# Patient Record
Sex: Female | Born: 1985 | Race: Black or African American | Hispanic: No | Marital: Single | Smoking: Never smoker
Health system: Southern US, Community
[De-identification: ages and names within clinical notes are randomized; demographics above are authoritative.]

## PROBLEM LIST (undated history)

## (undated) DIAGNOSIS — R112 Nausea with vomiting, unspecified: Secondary | ICD-10-CM

## (undated) DIAGNOSIS — Z973 Presence of spectacles and contact lenses: Secondary | ICD-10-CM

## (undated) DIAGNOSIS — N200 Calculus of kidney: Secondary | ICD-10-CM

## (undated) DIAGNOSIS — N201 Calculus of ureter: Secondary | ICD-10-CM

## (undated) DIAGNOSIS — E039 Hypothyroidism, unspecified: Secondary | ICD-10-CM

## (undated) DIAGNOSIS — Z87442 Personal history of urinary calculi: Secondary | ICD-10-CM

## (undated) DIAGNOSIS — R399 Unspecified symptoms and signs involving the genitourinary system: Secondary | ICD-10-CM

## (undated) DIAGNOSIS — Z8489 Family history of other specified conditions: Secondary | ICD-10-CM

## (undated) DIAGNOSIS — F32A Depression, unspecified: Secondary | ICD-10-CM

## (undated) DIAGNOSIS — E0590 Thyrotoxicosis, unspecified without thyrotoxic crisis or storm: Secondary | ICD-10-CM

## (undated) DIAGNOSIS — K219 Gastro-esophageal reflux disease without esophagitis: Secondary | ICD-10-CM

## (undated) DIAGNOSIS — F419 Anxiety disorder, unspecified: Secondary | ICD-10-CM

## (undated) HISTORY — DX: Calculus of kidney: N20.0

## (undated) HISTORY — DX: Thyrotoxicosis, unspecified without thyrotoxic crisis or storm: E05.90

## (undated) HISTORY — DX: Anxiety disorder, unspecified: F41.9

## (undated) HISTORY — DX: Depression, unspecified: F32.A

## (undated) HISTORY — PX: KNEE ARTHROSCOPY: SUR90

---

## 2010-10-15 DIAGNOSIS — R61 Generalized hyperhidrosis: Secondary | ICD-10-CM | POA: Insufficient documentation

## 2013-04-03 DIAGNOSIS — Z524 Kidney donor: Secondary | ICD-10-CM | POA: Insufficient documentation

## 2014-01-05 ENCOUNTER — Ambulatory Visit: Payer: Self-pay

## 2014-01-05 LAB — CBC WITH DIFFERENTIAL/PLATELET
BASOS PCT: 0.4 %
Basophil #: 0 10*3/uL (ref 0.0–0.1)
EOS ABS: 0.1 10*3/uL (ref 0.0–0.7)
Eosinophil %: 1.1 %
HCT: 38 % (ref 35.0–47.0)
HGB: 12.7 g/dL (ref 12.0–16.0)
Lymphocyte #: 2.6 10*3/uL (ref 1.0–3.6)
Lymphocyte %: 29.8 %
MCH: 32.4 pg (ref 26.0–34.0)
MCHC: 33.3 g/dL (ref 32.0–36.0)
MCV: 97 fL (ref 80–100)
MONOS PCT: 9.3 %
Monocyte #: 0.8 x10 3/mm (ref 0.2–0.9)
NEUTROS ABS: 5.2 10*3/uL (ref 1.4–6.5)
NEUTROS PCT: 59.4 %
PLATELETS: 262 10*3/uL (ref 150–440)
RBC: 3.91 10*6/uL (ref 3.80–5.20)
RDW: 13 % (ref 11.5–14.5)
WBC: 8.7 10*3/uL (ref 3.6–11.0)

## 2014-01-05 LAB — COMPREHENSIVE METABOLIC PANEL
ALK PHOS: 48 U/L
ANION GAP: 7 (ref 7–16)
AST: 16 U/L (ref 15–37)
Albumin: 3.3 g/dL — ABNORMAL LOW (ref 3.4–5.0)
BUN: 17 mg/dL (ref 7–18)
Bilirubin,Total: 0.2 mg/dL (ref 0.2–1.0)
CHLORIDE: 107 mmol/L (ref 98–107)
Calcium, Total: 8.9 mg/dL (ref 8.5–10.1)
Co2: 31 mmol/L (ref 21–32)
Creatinine: 0.75 mg/dL (ref 0.60–1.30)
EGFR (Non-African Amer.): >60
GLUCOSE: 101 mg/dL — AB (ref 65–99)
OSMOLALITY: 290 (ref 275–301)
Potassium: 5 mmol/L (ref 3.5–5.1)
SGPT (ALT): 37 U/L (ref 12–78)
SODIUM: 145 mmol/L (ref 136–145)
Total Protein: 6.8 g/dL (ref 6.4–8.2)

## 2014-01-05 LAB — URINALYSIS, COMPLETE
Bilirubin,UR: NEGATIVE
GLUCOSE, UR: NEGATIVE mg/dL (ref 0–75)
Ketone: NEGATIVE
Nitrite: NEGATIVE
PROTEIN: NEGATIVE
Ph: 7 (ref 4.5–8.0)
SPECIFIC GRAVITY: 1.01 (ref 1.003–1.030)

## 2014-01-05 LAB — PREGNANCY, URINE: Pregnancy Test, Urine: NEGATIVE m[IU]/mL

## 2014-01-05 LAB — LIPASE, BLOOD: LIPASE: 191 U/L (ref 73–393)

## 2014-01-05 LAB — AMYLASE: Amylase: 98 U/L (ref 25–115)

## 2014-01-08 LAB — URINE CULTURE

## 2015-09-06 ENCOUNTER — Other Ambulatory Visit: Payer: Self-pay | Admitting: Nurse Practitioner

## 2015-09-06 ENCOUNTER — Ambulatory Visit
Admission: RE | Admit: 2015-09-06 | Discharge: 2015-09-06 | Disposition: A | Discharge status: Home / Self Care | Payer: BLUE CROSS/BLUE SHIELD | Source: Ambulatory Visit | Attending: Nurse Practitioner | Admitting: Nurse Practitioner

## 2015-09-06 DIAGNOSIS — R062 Wheezing: Secondary | ICD-10-CM

## 2015-09-06 DIAGNOSIS — R059 Cough, unspecified: Secondary | ICD-10-CM

## 2015-09-06 DIAGNOSIS — R05 Cough: Secondary | ICD-10-CM | POA: Diagnosis not present

## 2016-03-23 ENCOUNTER — Ambulatory Visit (INDEPENDENT_AMBULATORY_CARE_PROVIDER_SITE_OTHER): Payer: BLUE CROSS/BLUE SHIELD | Admitting: Urology

## 2016-03-23 ENCOUNTER — Encounter: Payer: Self-pay | Admitting: Urology

## 2016-03-23 VITALS — BP 118/79 | HR 74 | Ht 61.0 in | Wt 101.0 lb

## 2016-03-23 DIAGNOSIS — N200 Calculus of kidney: Secondary | ICD-10-CM

## 2016-03-23 DIAGNOSIS — R310 Gross hematuria: Secondary | ICD-10-CM

## 2016-03-23 DIAGNOSIS — R109 Unspecified abdominal pain: Secondary | ICD-10-CM

## 2016-03-23 DIAGNOSIS — R1012 Left upper quadrant pain: Secondary | ICD-10-CM | POA: Diagnosis not present

## 2016-03-23 LAB — URINALYSIS, COMPLETE
Bilirubin, UA: NEGATIVE
Glucose, UA: NEGATIVE
Nitrite, UA: NEGATIVE
SPEC GRAV UA: 1.025 (ref 1.005–1.030)
Urobilinogen, Ur: 1 mg/dL (ref 0.2–1.0)
pH, UA: 6.5 (ref 5.0–7.5)

## 2016-03-23 LAB — MICROSCOPIC EXAMINATION: WBC, UA: >30 /hpf — ABNORMAL HIGH (ref 0–?)

## 2016-03-23 MED ORDER — OXYCODONE-ACETAMINOPHEN 10-325 MG PO TABS: 1.0000 | ORAL_TABLET | ORAL | 0 refills | Status: DC | PRN

## 2016-03-23 NOTE — Progress Notes (Signed)
03/23/2016 4:11 PM   Mariah Bird 1985-08-19 [id number]  Referring provider: Ronnell Freshwater, NP [place], [place] [address]  Chief Complaint  Patient presents with  . Nephrolithiasis    HPI: Patient is 30 year old [place] American female who presents today after passing several stones.    She brings a fragment in today.    She states that about three weeks ago she started experiencing renal colic and passing fragments.  Her pain is located in the left flank and radiates into the left waist.  The pain lasts for several minutes to a few hours. She is having some nausea and vomiting.  She stated that nothing helped the pain or made it worse.  It reached levels of 10/10 at times.    She is having nocturia, intermittency, hesitancy and gross hematuria.  Her UA today demonstrates 3-10 RBC's/hpf.  She also has > 30 WBC's/hpf and many bacteria.  She has not had fevers or chills.    CT scan in 2015 noted subtle bilateral nonobstructive nephrolithiasis. Marland Kitchen      PMH: Past Medical History:  Diagnosis Date  . Nephrolithiasis     Surgical History: Past Surgical History:  Procedure Laterality Date  . KNEE ARTHROSCOPY      Home Medications:    Medication List       Accurate as of 03/23/16  4:11 PM. Always use your most recent med list.          LOW-OGESTREL 0.3-30 MG-MCG tablet Generic drug:  norgestrel-ethinyl estradiol TK UTD   oxyCODONE-acetaminophen 10-325 MG tablet Commonly known as:  PERCOCET Take 1 tablet by mouth every 4 (four) hours as needed for pain.   Vitamin D (Ergocalciferol) 50000 units Caps capsule Commonly known as:  DRISDOL TK ONE C PO ONCE WEEKLY FOR VITAMIN-D DEFICIENCY       Allergies: No Known Allergies  Family History: Family History  Problem Relation Age of Onset  . Kidney Stones Father   . Kidney Stones Paternal Grandmother   . Bladder Cancer Neg Hx   . Kidney cancer Neg Hx   . Prostate cancer Neg Hx     Social  History:  reports that she has never smoked. She has never used smokeless tobacco. She reports that she drinks alcohol. She reports that she does not use drugs.  ROS: UROLOGY Frequent Urination?: No Hard to postpone urination?: No Burning/pain with urination?: No Get up at night to urinate?: Yes Leakage of urine?: No Urine stream starts and stops?: No Trouble starting stream?: No Do you have to strain to urinate?: No Blood in urine?: Yes Urinary tract infection?: No Sexually transmitted disease?: No Injury to kidneys or bladder?: No Painful intercourse?: Yes Weak stream?: No Currently pregnant?: No Vaginal bleeding?: Yes  Gastrointestinal Nausea?: Yes Vomiting?: Yes Indigestion/heartburn?: No Diarrhea?: No Constipation?: No  Constitutional Fever: No Night sweats?: No Weight loss?: No Fatigue?: No  Skin Skin rash/lesions?: No Itching?: No  Eyes Blurred vision?: No Double vision?: No  Ears/Nose/Throat Sore throat?: No Sinus problems?: Yes  Hematologic/Lymphatic Swollen glands?: No Easy bruising?: No  Cardiovascular Leg swelling?: No Chest pain?: No  Respiratory Cough?: No Shortness of breath?: No  Endocrine Excessive thirst?: No  Musculoskeletal Back pain?: Yes Joint pain?: No  Neurological Headaches?: No Dizziness?: No  Psychologic Depression?: No Anxiety?: No  Physical Exam: BP 118/79   Pulse 74   Ht 5\' 1"  (1.549 m)   Wt 101 lb (45.8 kg)   LMP 02/29/2016  BMI 19.08 kg/m   Constitutional: Well nourished. Alert and oriented, No acute distress. HEENT: [place] AT, moist mucus membranes. Trachea midline, no masses. Cardiovascular: No clubbing, cyanosis, or edema. Respiratory: Normal respiratory effort, no increased work of breathing. GI: Abdomen is soft, non tender, non distended, no abdominal masses. Liver and spleen not palpable.  No hernias appreciated.  Stool sample for occult testing is not indicated.   GU: No CVA tenderness.  No  bladder fullness or masses.   Skin: No rashes, bruises or suspicious lesions. Lymph: No cervical or inguinal adenopathy. Neurologic: Grossly intact, no focal deficits, moving all 4 extremities. Psychiatric: Normal mood and affect.  Laboratory Data: Lab Results  Component Value Date   WBC 8.7 01/05/2014   HGB 12.7 01/05/2014   HCT 38.0 01/05/2014   MCV 97 01/05/2014   PLT 262 01/05/2014    Lab Results  Component Value Date   CREATININE 0.75 01/05/2014    Lab Results  Component Value Date   AST 16 01/05/2014   Lab Results  Component Value Date   ALT 37 01/05/2014    Urinalysis Results for orders placed or performed in visit on 03/23/16  Microscopic Examination  Result Value Ref Range   WBC, UA >30 (H) 0 - 5 /hpf   RBC, UA 3-10 (A) 0 - 2 /hpf   Epithelial Cells (non renal) >10 (H) 0 - 10 /hpf   Mucus, UA Present (A) Not Estab.   Bacteria, UA Many (A) None seen/Few  Urinalysis, Complete  Result Value Ref Range   Specific Gravity, UA 1.025 1.005 - 1.030   pH, UA 6.5 5.0 - 7.5   Color, UA Yellow Yellow   Appearance Ur Clear Clear   Leukocytes, UA 3+ (A) Negative   Protein, UA Trace (A) Negative/Trace   Glucose, UA Negative Negative   Ketones, UA 4+ (A) Negative   RBC, UA Trace (A) Negative   Bilirubin, UA Negative Negative   Urobilinogen, Ur 1.0 0.2 - 1.0 mg/dL   Nitrite, UA Negative Negative   Microscopic Examination See below:     Pertinent Imaging: CLINICAL DATA:  Left side pain, hematuria   EXAM:  CT ABDOMEN AND PELVIS WITHOUT CONTRAST   TECHNIQUE:  Multidetector CT imaging of the abdomen and pelvis was performed  following the standard protocol without IV contrast.   COMPARISON:  None.   FINDINGS:  Lung bases are unremarkable. Sagittal images of the spine are  unremarkable. Unenhanced liver, spleen, pancreas and adrenal glands  are unremarkable. Unenhanced kidneys shows no hydronephrosis. There  is a subtle punctate nonobstructive  calcification in midpole of the  right kidney measures 1 mm. Punctate nonobstructive calcification in  midpole of the left kidney measures 2 mm. No proximal or mid  ureteral calcifications are identified. Abundant colonic stool. No  calcified gallstones are noted within gallbladder. No pericecal  inflammation. There is a low lying cecum. The cecum is in right  anterior pelvis just above the urinary bladder. Abundant stool noted  within cecum. There is no pericecal inflammation. Normal appendix  noted axial image 57.   There is mild right deviation of the unenhanced uterus. In axial  image 61 there is a punctate calcification in left posterior aspect  of the urinary bladder. Measures 1.5 mm. This is highly suspicious  for nonobstructive calcified calculus in distal left ureter.  Clinical correlation is necessary. No calcified calculi are noted  within urinary bladder.   IMPRESSION:  1. No hydronephrosis or hydroureter. Subtle bilateral nonobstructive  nephrolithiasis.  2. There is 1.5 mm punctate calcification just posterior to urinary  bladder on the left side highly suspicious for a distal left  ureteral calculus. No distension of distal left ureter.  3. There is a low lying cecum. Abundant stool noted within cecum. No  pericecal inflammation. Normal appendix.  4. Anteflexed uterus deviated to the right.    Electronically Signed    By: Lahoma Crocker M.D.    On: 01/05/2014 20:40   Assessment & Plan:    1. Nephrolithiasis  - fragment will be sent for analysis  - history of subtle bilateral nonobstructive nephrolithiasis seen on 2015 CT  2. Left flank pain  - patient given a script for Percocet 10/325, #10  - schedule CT Renal stone study  - advised to contact our office or seek treatment in the ED if becomes febrile or pain/ vomiting are difficult control in order to arrange for emergent/urgent intervention  3. Gross hematuria  -  continue to monitor the patient's UA after  the treatment/passage of the stone to ensure the hematuria has resolved.  If hematuria persists, we will pursue a hematuria workup with CT Urogram and cystoscopy if appropriate.  - Urinalysis, Complete  - urine culture   Return for CT Renal scan report.  These notes generated with voice recognition software. I apologize for typographical errors.  Zara Council, [facility] [address] [place], [place] [address] [phone]

## 2016-03-26 LAB — CULTURE, URINE COMPREHENSIVE

## 2016-03-30 ENCOUNTER — Telehealth: Payer: Self-pay

## 2016-03-30 DIAGNOSIS — N390 Urinary tract infection, site not specified: Secondary | ICD-10-CM

## 2016-03-30 MED ORDER — AMOXICILLIN-POT CLAVULANATE 875-125 MG PO TABS: 1.0000 | ORAL_TABLET | Freq: Two times a day (BID) | ORAL | 0 refills | Status: AC

## 2016-03-30 NOTE — Telephone Encounter (Signed)
Spoke with pt in reference to +ucx and abx. Pt voiced understanding.  

## 2016-03-30 NOTE — Telephone Encounter (Signed)
LMOM-medication sent to pharmacy 

## 2016-03-30 NOTE — Telephone Encounter (Signed)
-----   Message from Nori Riis, PA-C sent at 03/26/2016  7:39 PM EDT ----- Please notify the patient that she has a positive urine culture. She will need to start Augmentin 875/125 one twice daily for 7 days.

## 2016-03-31 ENCOUNTER — Ambulatory Visit
Admission: RE | Admit: 2016-03-31 | Discharge: 2016-03-31 | Disposition: A | Discharge status: Home / Self Care | Payer: BLUE CROSS/BLUE SHIELD | Source: Ambulatory Visit | Attending: Urology | Admitting: Urology

## 2016-03-31 DIAGNOSIS — N200 Calculus of kidney: Secondary | ICD-10-CM | POA: Diagnosis not present

## 2016-04-02 ENCOUNTER — Other Ambulatory Visit: Payer: Self-pay | Admitting: Urology

## 2016-04-07 ENCOUNTER — Telehealth: Payer: Self-pay | Admitting: Urology

## 2016-04-07 ENCOUNTER — Ambulatory Visit (INDEPENDENT_AMBULATORY_CARE_PROVIDER_SITE_OTHER): Payer: BLUE CROSS/BLUE SHIELD | Admitting: Urology

## 2016-04-07 ENCOUNTER — Encounter: Payer: Self-pay | Admitting: Urology

## 2016-04-07 VITALS — BP 109/78 | HR 82 | Ht 61.0 in | Wt 100.0 lb

## 2016-04-07 DIAGNOSIS — R1012 Left upper quadrant pain: Secondary | ICD-10-CM

## 2016-04-07 DIAGNOSIS — R109 Unspecified abdominal pain: Secondary | ICD-10-CM

## 2016-04-07 DIAGNOSIS — R310 Gross hematuria: Secondary | ICD-10-CM

## 2016-04-07 DIAGNOSIS — N200 Calculus of kidney: Secondary | ICD-10-CM | POA: Diagnosis not present

## 2016-04-07 NOTE — Telephone Encounter (Signed)
Please order a Litholink on this patient.

## 2016-04-07 NOTE — Progress Notes (Signed)
04/07/2016 4:27 PM   Mariah Bird February 22, 1986 [id number]  Referring provider: Ronnell Freshwater, NP [place], [place] [address]  Chief Complaint  Patient presents with  . Follow-up    CT results    HPI: Patient is 29 year old [place] American female who presents today to discuss her CT scan results.    Background history Patient presented to [place] after passing several stones.  She brought in a fragment for analysis.  She states that about three weeks ago she started experiencing renal colic and passing fragments.  Her pain is located in the left flank and radiates into the left waist.  The pain lasts for several minutes to a few hours. She is having some nausea and vomiting.  She stated that nothing helped the pain or made it worse.  It reached levels of 10/10 at times.  She was having nocturia, intermittency, hesitancy and gross hematuria.  Her UA today demonstrated 3-10 RBC's/hpf.  She also has > 30 WBC's/hpf and many bacteria.  She has not had fevers or chills.  Urine culture was positive for Beta hemolytic Streptococcus, group B.  She has completed her antibiotic.    CT scan in 2015 noted subtle bilateral nonobstructive nephrolithiasis. .    CT scan performed on 03/31/2016 noted a stable tiny non obstructive right renal calculus.  No evidence of ureteral calculi, hydronephrosis or other acute findings.  I have personally reviewed the films and believe there may be papillary blush.   Today, patient is not experiencing flank pain, nocturia, intermittency, hesitancy or gross hematuria.  She has not had recent fevers, chills, nausea or vomiting.    Stone composition consists of 80% calcium phosphate, 10% calcium oxalate dihydrate and 10% calcium oxalate monohydrate.    PMH: Past Medical History:  Diagnosis Date  . Nephrolithiasis     Surgical History: Past Surgical History:  Procedure Laterality Date  . KNEE ARTHROSCOPY      Home Medications:    Medication  List       Accurate as of 04/07/16  4:27 PM. Always use your most recent med list.          LOW-OGESTREL 0.3-30 MG-MCG tablet Generic drug:  norgestrel-ethinyl estradiol TK UTD   oxyCODONE-acetaminophen 10-325 MG tablet Commonly known as:  PERCOCET Take 1 tablet by mouth every 4 (four) hours as needed for pain.   Vitamin D (Ergocalciferol) 50000 units Caps capsule Commonly known as:  DRISDOL TK ONE C PO ONCE WEEKLY FOR VITAMIN-D DEFICIENCY       Allergies: No Known Allergies  Family History: Family History  Problem Relation Age of Onset  . Kidney Stones Father   . Kidney Stones Paternal Grandmother   . Bladder Cancer Neg Hx   . Kidney cancer Neg Hx   . Prostate cancer Neg Hx     Social History:  reports that she has never smoked. She has never used smokeless tobacco. She reports that she drinks alcohol. She reports that she does not use drugs.  ROS: UROLOGY Frequent Urination?: No Hard to postpone urination?: No Burning/pain with urination?: No Get up at night to urinate?: No Leakage of urine?: No Urine stream starts and stops?: No Trouble starting stream?: No Do you have to strain to urinate?: No Blood in urine?: No Urinary tract infection?: No Sexually transmitted disease?: No Injury to kidneys or bladder?: No Painful intercourse?: No Weak stream?: No Currently pregnant?: No Vaginal bleeding?: No Last menstrual period?: n  Gastrointestinal Nausea?:  No Vomiting?: No Indigestion/heartburn?: No Diarrhea?: No Constipation?: No  Constitutional Fever: No Night sweats?: No Weight loss?: No Fatigue?: No  Skin Skin rash/lesions?: No Itching?: No  Eyes Blurred vision?: No Double vision?: No  Ears/Nose/Throat Sore throat?: No Sinus problems?: No  Hematologic/Lymphatic Swollen glands?: No Easy bruising?: No  Cardiovascular Leg swelling?: No Chest pain?: No  Respiratory Cough?: No Shortness of breath?: No  Endocrine Excessive thirst?:  No  Musculoskeletal Back pain?: No Joint pain?: No  Neurological Headaches?: No Dizziness?: No  Psychologic Depression?: No Anxiety?: No  Physical Exam: BP 109/78 (BP Location: Right Arm, Patient Position: Sitting, Cuff Size: Normal)   Pulse 82   Ht 5\' 1"  (1.549 m)   Wt 100 lb (45.4 kg)   LMP 03/29/2016 (Exact Date)   BMI 18.89 kg/m   Constitutional: Well nourished. Alert and oriented, No acute distress. HEENT: [place] AT, moist mucus membranes. Trachea midline, no masses. Cardiovascular: No clubbing, cyanosis, or edema. Respiratory: Normal respiratory effort, no increased work of breathing. Skin: No rashes, bruises or suspicious lesions. Lymph: No cervical or inguinal adenopathy. Neurologic: Grossly intact, no focal deficits, moving all 4 extremities. Psychiatric: Normal mood and affect.  Laboratory Data: Lab Results  Component Value Date   WBC 8.7 01/05/2014   HGB 12.7 01/05/2014   HCT 38.0 01/05/2014   MCV 97 01/05/2014   PLT 262 01/05/2014    Lab Results  Component Value Date   CREATININE 0.75 01/05/2014    Lab Results  Component Value Date   AST 16 01/05/2014   Lab Results  Component Value Date   ALT 37 01/05/2014      Pertinent Imaging: CLINICAL DATA:  Left flank pain, dysuria, and microscopic hematuria for 3 weeks. Nephrolithiasis.  EXAM: CT ABDOMEN AND PELVIS WITHOUT CONTRAST  TECHNIQUE: Multidetector CT imaging of the abdomen and pelvis was performed following the standard protocol without IV contrast.  COMPARISON:  01/05/2014  FINDINGS: Lower chest:  No acute findings.  Hepatobiliary: No mass visualized on this un-enhanced exam. Gallbladder is unremarkable.  Pancreas: No mass or inflammatory process identified on this un-enhanced exam.  Spleen: Within normal limits in size.  Adrenals/Urinary Tract: 1 mm calculus seen in midpole of right kidney. No evidence of hydronephrosis. No evidence of ureteral calculi or  dilatation. No bladder calculi identified.  Stomach/Bowel: No evidence of obstruction, inflammatory process, or abnormal fluid collections. Normal appendix visualized.  Vascular/Lymphatic: No pathologically enlarged lymph nodes. No evidence of abdominal aortic aneurysm.  Reproductive: No mass or other significant abnormality.  Other: None.  Musculoskeletal:  No suspicious bone lesions identified.  IMPRESSION: Stable tiny nonobstructive right renal calculus. No evidence of ureteral calculi, hydronephrosis, or other acute findings.   Electronically Signed   By: Earle Gell M.D.   On: 03/31/2016 17:41  Assessment & Plan:    1. Nephrolithiasis  - stable right renal calculus  - will obtain a 24 urine metabolic analysis  - RTC to discuss results  2. Left flank pain  - no stones seen in left kidney on CT  - flank pain resolved  3. Gross hematuria  -  resolved  Return for Litholink- follow up when results available.  These notes generated with voice recognition software. I apologize for typographical errors.  Zara Council, [facility] [address], [place], [place] [address] [phone]

## 2016-04-08 NOTE — Telephone Encounter (Signed)
Litholink ordered and faxed.

## 2016-08-31 ENCOUNTER — Other Ambulatory Visit: Payer: BLUE CROSS/BLUE SHIELD

## 2016-09-07 ENCOUNTER — Other Ambulatory Visit: Payer: BLUE CROSS/BLUE SHIELD

## 2016-09-14 ENCOUNTER — Telehealth: Payer: Self-pay

## 2016-09-14 NOTE — Telephone Encounter (Signed)
I do not have her result in the chart and she will need an office visit to go over those results as they are quite detailed.

## 2016-09-14 NOTE — Telephone Encounter (Signed)
Pt called requesting litholink results. Please advise.

## 2016-09-14 NOTE — Telephone Encounter (Signed)
They are scanned in under labs and then stone analysis results. Or is this something different?

## 2016-09-15 NOTE — Telephone Encounter (Signed)
Per Larene Beach those are different labs. Will make pt aware results are not back yet.

## 2016-09-15 NOTE — Telephone Encounter (Signed)
Spoke with pt in reference to litholink results. Pt voiced understanding.

## 2016-09-16 ENCOUNTER — Other Ambulatory Visit: Payer: Self-pay | Admitting: Urology

## 2016-09-18 ENCOUNTER — Telehealth: Payer: Self-pay

## 2016-09-18 NOTE — Telephone Encounter (Signed)
Spoke with pt in reference needing an appt for litho link results. Pt voiced understanding. Pt was transferred to the front to make appt.

## 2016-09-18 NOTE — Telephone Encounter (Signed)
-----   Message from Nori Riis, PA-C sent at 09/18/2016  8:36 AM EST ----- Patient needs an office visit to discuss Litholink results.

## 2016-09-30 ENCOUNTER — Encounter: Payer: Self-pay | Admitting: Urology

## 2016-09-30 ENCOUNTER — Ambulatory Visit: Payer: BLUE CROSS/BLUE SHIELD | Admitting: Urology

## 2016-09-30 VITALS — BP 108/73 | HR 85 | Ht 61.0 in | Wt 96.7 lb

## 2016-09-30 DIAGNOSIS — R8299 Other abnormal findings in urine: Secondary | ICD-10-CM | POA: Diagnosis not present

## 2016-09-30 DIAGNOSIS — R34 Anuria and oliguria: Secondary | ICD-10-CM

## 2016-09-30 DIAGNOSIS — N200 Calculus of kidney: Secondary | ICD-10-CM

## 2016-09-30 DIAGNOSIS — R82991 Hypocitraturia: Secondary | ICD-10-CM

## 2016-09-30 MED ORDER — POTASSIUM CITRATE ER 10 MEQ (1080 MG) PO TBCR: 10.0000 meq | EXTENDED_RELEASE_TABLET | Freq: Three times a day (TID) | ORAL | 12 refills | Status: DC

## 2016-09-30 NOTE — Progress Notes (Signed)
09/30/2016 9:21 AM   Mariah Bird 27-Feb-1986 [id number]  Referring provider: Ronnell Freshwater, NP [place], [place] [address]  Chief Complaint  Patient presents with  . Results    LithoLink    HPI: 31 yo AAF who presents today for discussion of her Lithol ink results.    Background history Patient presented to [place] after passing several stones.  She brought in a fragment for analysis.  She states that about three weeks ago she started experiencing renal colic and passing fragments.  Her pain is located in the left flank and radiates into the left waist.  The pain lasts for several minutes to a few hours. She is having some nausea and vomiting.  She stated that nothing helped the pain or made it worse.  It reached levels of 10/10 at times.  She was having nocturia, intermittency, hesitancy and gross hematuria.  Her UA today demonstrated 3-10 RBC's/hpf.  She also has > 30 WBC's/hpf and many bacteria.  She has not had fevers or chills.  Urine culture was positive for Beta hemolytic Streptococcus, group B.  She has completed her antibiotic.    CT scan in 2015 noted subtle bilateral nonobstructive nephrolithiasis. .    CT scan performed on 03/31/2016 noted a stable tiny non obstructive right renal calculus.  No evidence of ureteral calculi, hydronephrosis or other acute findings.  I have personally reviewed the films and believe there may be papillary blush.   Stone composition consists of 80% calcium phosphate, 10% calcium oxalate dihydrate and 10% calcium oxalate monohydrate.  24 hour urine metabolic workup completed on 09/07/2016 notes an extremely low urine volume, hypomagnesiuria, hypocitraturia, high urine pH, moderate calcium phosphate stone risk.   PMH: Past Medical History:  Diagnosis Date  . Nephrolithiasis     Surgical History: Past Surgical History:  Procedure Laterality Date  . KNEE ARTHROSCOPY      Home Medications:  Allergies as of 09/30/2016     No Known Allergies     Medication List       Accurate as of 09/30/16  9:21 AM. Always use your most recent med list.          EPIPEN 2-PAK 0.3 mg/0.3 mL Soaj injection Generic drug:  EPINEPHrine Inject into the muscle.   LOW-OGESTREL 0.3-30 MG-MCG tablet Generic drug:  norgestrel-ethinyl estradiol TK UTD   oxyCODONE-acetaminophen 10-325 MG tablet Commonly known as:  PERCOCET Take 1 tablet by mouth every 4 (four) hours as needed for pain.   Vitamin D (Ergocalciferol) 50000 units Caps capsule Commonly known as:  DRISDOL TK ONE C PO ONCE WEEKLY FOR VITAMIN-D DEFICIENCY       Allergies: No Known Allergies  Family History: Family History  Problem Relation Age of Onset  . Kidney Stones Father   . Kidney Stones Paternal Grandmother   . Bladder Cancer Neg Hx   . Kidney cancer Neg Hx   . Prostate cancer Neg Hx     Social History:  reports that she has never smoked. She has never used smokeless tobacco. She reports that she drinks alcohol. She reports that she does not use drugs.  ROS: UROLOGY Frequent Urination?: No Hard to postpone urination?: No Burning/pain with urination?: No Get up at night to urinate?: No Leakage of urine?: No Urine stream starts and stops?: No Trouble starting stream?: No Do you have to strain to urinate?: No Blood in urine?: No Urinary tract infection?: Yes Sexually transmitted disease?: No Injury to kidneys or  bladder?: No Painful intercourse?: No Weak stream?: No Currently pregnant?: No Vaginal bleeding?: No Last menstrual period?: n  Gastrointestinal Nausea?: No Vomiting?: No Indigestion/heartburn?: No Diarrhea?: No Constipation?: No  Constitutional Fever: No Night sweats?: No Weight loss?: No Fatigue?: No  Skin Skin rash/lesions?: No Itching?: No  Eyes Blurred vision?: No Double vision?: No  Ears/Nose/Throat Sore throat?: No Sinus problems?: No  Hematologic/Lymphatic Swollen glands?: No Easy bruising?:  No  Cardiovascular Leg swelling?: No Chest pain?: No  Respiratory Cough?: No Shortness of breath?: No  Endocrine Excessive thirst?: No  Musculoskeletal Back pain?: No Joint pain?: No  Neurological Headaches?: No Dizziness?: No  Psychologic Depression?: No Anxiety?: No  Physical Exam: BP 108/73   Pulse 85   Ht 5\' 1"  (1.549 m)   Wt 96 lb 11.2 oz (43.9 kg)   LMP 09/24/2016   BMI 18.27 kg/m   Constitutional: Well nourished. Alert and oriented, No acute distress. HEENT: [place] AT, moist mucus membranes. Trachea midline, no masses. Cardiovascular: No clubbing, cyanosis, or edema. Respiratory: Normal respiratory effort, no increased work of breathing. Skin: No rashes, bruises or suspicious lesions. Lymph: No cervical or inguinal adenopathy. Neurologic: Grossly intact, no focal deficits, moving all 4 extremities. Psychiatric: Normal mood and affect.  Laboratory Data: Lab Results  Component Value Date   WBC 8.7 01/05/2014   HGB 12.7 01/05/2014   HCT 38.0 01/05/2014   MCV 97 01/05/2014   PLT 262 01/05/2014    Lab Results  Component Value Date   CREATININE 0.75 01/05/2014     Lab Results  Component Value Date   AST 16 01/05/2014   Lab Results  Component Value Date   ALT 37 01/05/2014    Assessment & Plan:    1. Low urine volume  - encouraged patient to increase her water intake to 2.5 L (10 to 12 cups) daily  2. Hypocitraturia  - start Urocit-K 10 mEq tid - advised of side effect of stomach upset and electrolyte disorders  - RTC in 4 months for UA, CBC and BMP  3. Right renal stones  - KUB's yearly  - Advised to contact our office or seek treatment in the ED if becomes febrile or pain/ vomiting are difficult control in order to arrange for emergent/urgent intervention   Return in about 4 months (around 01/28/2017) for UA, CBC, office visit and BMP.  These notes generated with voice recognition software. I apologize for typographical  errors.  Zara Council, [facility] [address], [place], [place] [address] [phone]

## 2016-11-11 ENCOUNTER — Telehealth: Payer: Self-pay | Admitting: Urology

## 2016-11-11 NOTE — Telephone Encounter (Signed)
Patient called the office this afternoon.  She is having trouble taking the potassium citrate pills.  She said that you told her in the office that there is a liquid option if she is unable to take the pill  She uses the [facility] on [address] in [place].

## 2016-11-11 NOTE — Telephone Encounter (Signed)
Would you call the pharmacy and ask them about the liquid form?

## 2016-11-16 NOTE — Telephone Encounter (Signed)
Tried to call pharmacy held on for ten minutes no one answered.

## 2016-11-17 NOTE — Telephone Encounter (Signed)
Spoke with [facility] pharmacist who stated they can only get potassium chloride as a liquid.

## 2016-11-17 NOTE — Telephone Encounter (Signed)
Okay.  Patient will have to continue with the pills or increase her citrus juices.

## 2016-11-17 NOTE — Telephone Encounter (Signed)
Spoke with pt in reference to potassium coming in liquid form or increasing citric juices. Pt stated that she would prefer to increase citric juices.

## 2016-12-18 HISTORY — PX: NASAL SINUS SURGERY: SHX719

## 2017-01-28 ENCOUNTER — Ambulatory Visit: Payer: BLUE CROSS/BLUE SHIELD | Admitting: Urology

## 2017-02-01 ENCOUNTER — Ambulatory Visit: Payer: BLUE CROSS/BLUE SHIELD | Admitting: Urology

## 2017-02-02 ENCOUNTER — Encounter: Payer: Self-pay | Admitting: Urology

## 2017-02-02 ENCOUNTER — Ambulatory Visit (INDEPENDENT_AMBULATORY_CARE_PROVIDER_SITE_OTHER): Payer: BLUE CROSS/BLUE SHIELD | Admitting: Urology

## 2017-02-02 VITALS — BP 113/78 | HR 74 | Ht 61.0 in | Wt 102.4 lb

## 2017-02-02 DIAGNOSIS — R34 Anuria and oliguria: Secondary | ICD-10-CM

## 2017-02-02 DIAGNOSIS — N200 Calculus of kidney: Secondary | ICD-10-CM

## 2017-02-02 DIAGNOSIS — R8299 Other abnormal findings in urine: Secondary | ICD-10-CM

## 2017-02-02 DIAGNOSIS — R82991 Hypocitraturia: Secondary | ICD-10-CM

## 2017-02-02 LAB — URINALYSIS, COMPLETE
Bilirubin, UA: NEGATIVE
Glucose, UA: NEGATIVE
KETONES UA: NEGATIVE
Nitrite, UA: NEGATIVE
RBC, UA: NEGATIVE
Specific Gravity, UA: 1.025 (ref 1.005–1.030)
Urobilinogen, Ur: 0.2 mg/dL (ref 0.2–1.0)
pH, UA: 6.5 (ref 5.0–7.5)

## 2017-02-02 LAB — MICROSCOPIC EXAMINATION

## 2017-02-02 NOTE — Progress Notes (Signed)
02/02/2017 9:16 AM   Mariah Bird [birth year] [id number]  Referring provider: Ronnell Freshwater, NP [place], [place] [address]  Chief Complaint  Patient presents with  . Follow-up    4 month decreaed urine volume    HPI: 31 yo AAF who presents today for a 4 month follow up.    Background history Patient presented to [place] after passing several stones.  She brought in a fragment for analysis.  She states that about three weeks ago she started experiencing renal colic and passing fragments.  Her pain is located in the left flank and radiates into the left waist.  The pain lasts for several minutes to a few hours. She is having some nausea and vomiting.  She stated that nothing helped the pain or made it worse.  It reached levels of 10/10 at times.  She was having nocturia, intermittency, hesitancy and gross hematuria.  Her UA today demonstrated 3-10 RBC's/hpf.  She also has > 30 WBC's/hpf and many bacteria.  She has not had fevers or chills.  Urine culture was positive for Beta hemolytic Streptococcus, group B.  She has completed her antibiotic.    CT scan in 2015 noted subtle bilateral nonobstructive nephrolithiasis. .    CT scan performed on 03/31/2016 noted a stable tiny non obstructive right renal calculus.  No evidence of ureteral calculi, hydronephrosis or other acute findings.  I have personally reviewed the films and believe there may be papillary blush.   Stone composition consists of 80% calcium phosphate, 10% calcium oxalate dihydrate and 10% calcium oxalate monohydrate.  24 hour urine metabolic workup completed on 09/07/2016 notes an extremely low urine volume, hypomagnesiuria, hypocitraturia, high urine pH, moderate calcium phosphate stone risk.  She was unable to tolerate the Urocit-K.  She takes it periodically.  She is not drinking water as she should.  She has not had any fevers, chills, nausea or vomiting.  She has not had flank pain, hematuria or  passage of fragments.  Her UA today is negative for hematuria, pH is 6.5 and specific gravity 6.45.     PMH: Past Medical History:  Diagnosis Date  . Nephrolithiasis     Surgical History: Past Surgical History:  Procedure Laterality Date  . KNEE ARTHROSCOPY    . NASAL SINUS SURGERY  12/18/2016    Home Medications:  Allergies as of 02/02/2017   No Known Allergies     Medication List       Accurate as of 02/02/17  9:16 AM. Always use your most recent med list.          ASTEPRO 0.15 % Soln Generic drug:  Azelastine HCl Place into the nose.   CYCLAFEM 1/35 tablet Generic drug:  norethindrone-ethinyl estradiol 1/35   cyclobenzaprine 10 MG tablet Commonly known as:  FLEXERIL   EPIPEN 2-PAK 0.3 mg/0.3 mL Soaj injection Generic drug:  EPINEPHrine Inject into the muscle.   fexofenadine-pseudoephedrine 60-120 MG 12 hr tablet Commonly known as:  ALLEGRA-D Take by mouth.   fluticasone 50 MCG/ACT nasal spray Commonly known as:  FLONASE Place into the nose.   HYDROcodone-acetaminophen 10-325 MG tablet Commonly known as:  NORCO   levocetirizine 5 MG tablet Commonly known as:  XYZAL Take 5 mg by mouth.   LOW-OGESTREL 0.3-30 MG-MCG tablet Generic drug:  norgestrel-ethinyl estradiol TK UTD   montelukast 10 MG tablet Commonly known as:  SINGULAIR Take 10 mg by mouth.   OMNARIS 50 MCG/ACT nasal spray Generic drug:  ciclesonide  Place into the nose.   oxyCODONE-acetaminophen 10-325 MG tablet Commonly known as:  PERCOCET Take 1 tablet by mouth every 4 (four) hours as needed for pain.   potassium citrate 10 MEQ (1080 MG) SR tablet Commonly known as:  UROCIT-K Take 1 tablet (10 mEq total) by mouth 3 (three) times daily with meals.   Vitamin D (Ergocalciferol) 50000 units Caps capsule Commonly known as:  DRISDOL TK ONE C PO ONCE WEEKLY FOR VITAMIN-D DEFICIENCY       Allergies: No Known Allergies  Family History: Family History  Problem Relation Age of  Onset  . Kidney Stones Father   . Kidney Stones Paternal Grandmother   . Bladder Cancer Neg Hx   . Kidney cancer Neg Hx   . Prostate cancer Neg Hx     Social History:  reports that she has never smoked. She has never used smokeless tobacco. She reports that she drinks alcohol. She reports that she does not use drugs.  ROS: UROLOGY Frequent Urination?: No Hard to postpone urination?: No Burning/pain with urination?: No Get up at night to urinate?: No Leakage of urine?: No Urine stream starts and stops?: No Trouble starting stream?: No Do you have to strain to urinate?: No Blood in urine?: No Urinary tract infection?: No Sexually transmitted disease?: No Injury to kidneys or bladder?: No Painful intercourse?: No Weak stream?: No Currently pregnant?: No Vaginal bleeding?: No Last menstrual period?: N  Gastrointestinal Nausea?: No Vomiting?: No Indigestion/heartburn?: No Diarrhea?: No Constipation?: No  Constitutional Fever: No Night sweats?: No Weight loss?: No Fatigue?: No  Skin Skin rash/lesions?: No Itching?: No  Eyes Blurred vision?: No Double vision?: No  Ears/Nose/Throat Sore throat?: No Sinus problems?: No  Hematologic/Lymphatic Swollen glands?: No Easy bruising?: No  Cardiovascular Leg swelling?: No Chest pain?: No  Respiratory Cough?: No Shortness of breath?: No  Endocrine Excessive thirst?: No  Musculoskeletal Back pain?: No Joint pain?: No  Neurological Headaches?: No Dizziness?: No  Psychologic Depression?: No Anxiety?: No  Physical Exam: BP 113/78   Pulse 74   Ht 5\' 1"  (1.549 m)   Wt 102 lb 6.4 oz (46.4 kg)   LMP 01/21/2017   BMI 19.35 kg/m   Constitutional: Well nourished. Alert and oriented, No acute distress. HEENT: [place] AT, moist mucus membranes. Trachea midline, no masses. Cardiovascular: No clubbing, cyanosis, or edema. Respiratory: Normal respiratory effort, no increased work of breathing. Skin: No rashes,  bruises or suspicious lesions. Lymph: No cervical or inguinal adenopathy. Neurologic: Grossly intact, no focal deficits, moving all 4 extremities. Psychiatric: Normal mood and affect.  Laboratory Data: Lab Results  Component Value Date   WBC 8.7 01/05/2014   HGB 12.7 01/05/2014   HCT 38.0 01/05/2014   MCV 97 01/05/2014   PLT 262 01/05/2014    Lab Results  Component Value Date   CREATININE 0.75 01/05/2014     Lab Results  Component Value Date   AST 16 01/05/2014   Lab Results  Component Value Date   ALT 37 01/05/2014    Assessment & Plan:    1. Low urine volume  - encouraged patient to increase her water intake to 2.5 L (10 to 12 cups) daily - she states that she was doing well until recently  - she is trying to add fruits to her water for better tolerability  2. Hypocitraturia  - start Urocit-K 10 mEq tid - advised of side effect of stomach upset and electrolyte disorders - has a difficult time tolerating the medications -  she will try to take the Urocit-K at lunchtime and at dinner time  - CBC and BMP drawn today  - RTC in 4 months for UA, CBC and BMP  3. Right renal stones  - KUB's yearly  - Advised to contact our office or seek treatment in the ED if becomes febrile or pain/ vomiting are difficult control in order to arrange for emergent/urgent intervention   Return in about 4 months (around 06/04/2017) for UA, KUB. CMP and office visit.  These notes generated with voice recognition software. I apologize for typographical errors.  Zara Council, [facility] [address], [place], [place] [address] [phone]

## 2017-02-03 ENCOUNTER — Telehealth: Payer: Self-pay

## 2017-02-03 LAB — CBC WITH DIFFERENTIAL/PLATELET
BASOS: 0 %
Basophils Absolute: 0 10*3/uL (ref 0.0–0.2)
EOS (ABSOLUTE): 0.1 10*3/uL (ref 0.0–0.4)
EOS: 2 %
HEMATOCRIT: 41.1 % (ref 34.0–46.6)
Hemoglobin: 13.3 g/dL (ref 11.1–15.9)
IMMATURE GRANS (ABS): 0 10*3/uL (ref 0.0–0.1)
Immature Granulocytes: 0 %
Lymphocytes Absolute: 2 10*3/uL (ref 0.7–3.1)
Lymphs: 36 %
MCH: 31.7 pg (ref 26.6–33.0)
MCHC: 32.4 g/dL (ref 31.5–35.7)
MCV: 98 fL — AB (ref 79–97)
Monocytes Absolute: 0.4 10*3/uL (ref 0.1–0.9)
Monocytes: 8 %
NEUTROS ABS: 2.9 10*3/uL (ref 1.4–7.0)
NEUTROS PCT: 54 %
Platelets: 276 10*3/uL (ref 150–379)
RBC: 4.19 x10E6/uL (ref 3.77–5.28)
RDW: 12.7 % (ref 12.3–15.4)
WBC: 5.4 10*3/uL (ref 3.4–10.8)

## 2017-02-03 LAB — BASIC METABOLIC PANEL
BUN/Creatinine Ratio: 14 (ref 9–23)
BUN: 10 mg/dL (ref 6–20)
CO2: 20 mmol/L (ref 20–29)
CREATININE: 0.69 mg/dL (ref 0.57–1.00)
Calcium: 9.6 mg/dL (ref 8.7–10.2)
Chloride: 104 mmol/L (ref 96–106)
GFR, EST AFRICAN AMERICAN: 136 mL/min/{1.73_m2} (ref >59)
GFR, EST NON AFRICAN AMERICAN: 118 mL/min/{1.73_m2} (ref >59)
Glucose: 81 mg/dL (ref 65–99)
Potassium: 4.3 mmol/L (ref 3.5–5.2)
SODIUM: 138 mmol/L (ref 134–144)

## 2017-02-03 NOTE — Telephone Encounter (Signed)
Called patient. Gave lab results. Patient verbalized understanding.  

## 2017-02-03 NOTE — Telephone Encounter (Signed)
-----   Message from Nori Riis, PA-C sent at 02/03/2017 10:32 AM EDT ----- Please let Anderson Malta know that her lab work is normal.

## 2017-03-03 NOTE — Progress Notes (Signed)
03/04/2017 5:01 PM   Mariah Bird 09-Sep-1985 [id number]  Referring provider: Ronnell Freshwater, NP [place], [place] [address]  Chief Complaint  Patient presents with  . Results    KUB    HPI: 31 yo AAF who presents today for a 4 month follow up.    Background history Patient presented to [place] after passing several stones.  She brought in a fragment for analysis.  She states that about three weeks ago she started experiencing renal colic and passing fragments.  Her pain is located in the left flank and radiates into the left waist.  The pain lasts for several minutes to a few hours. She is having some nausea and vomiting.  She stated that nothing helped the pain or made it worse.  It reached levels of 10/10 at times.  She was having nocturia, intermittency, hesitancy and gross hematuria.  Her UA today demonstrated 3-10 RBC's/hpf.  She also has > 30 WBC's/hpf and many bacteria.  She has not had fevers or chills.  Urine culture was positive for Beta hemolytic Streptococcus, group B.  She has completed her antibiotic.    CT scan in 2015 noted subtle bilateral nonobstructive nephrolithiasis.  CT scan performed on 03/31/2016 noted a stable tiny non obstructive right renal calculus.  No evidence of ureteral calculi, hydronephrosis or other acute findings.  I have personally reviewed the films and believe there may be papillary blush.  Stone composition consists of 80% calcium phosphate, 10% calcium oxalate dihydrate and 10% calcium oxalate monohydrate.  24 hour urine metabolic workup completed on 09/07/2016 notes an extremely low urine volume, hypomagnesiuria, hypocitraturia, high urine pH, moderate calcium phosphate stone risk.  She has been taking the Urocit-K more consisently.  She has also been keeping up her water intake and citrus juice intake.  d.  She has not had any fevers, chills, nausea or vomiting.  She has not had flank pain, hematuria or passage of fragments.  Her  UA today is negative for hematuria, pH is 6.5 and specific gravity 1.025.  CBC and BMP are normal.    Her KUB today is negative for stones.  She feels she may have passed a stone recently.     PMH: Past Medical History:  Diagnosis Date  . Nephrolithiasis     Surgical History: Past Surgical History:  Procedure Laterality Date  . KNEE ARTHROSCOPY    . NASAL SINUS SURGERY  12/18/2016    Home Medications:  Allergies as of 03/04/2017   No Known Allergies     Medication List       Accurate as of 03/04/17  5:01 PM. Always use your most recent med list.          ASTEPRO 0.15 % Soln Generic drug:  Azelastine HCl Place into the nose.   CYCLAFEM 1/35 tablet Generic drug:  norethindrone-ethinyl estradiol 1/35   cyclobenzaprine 10 MG tablet Commonly known as:  FLEXERIL   EPIPEN 2-PAK 0.3 mg/0.3 mL Soaj injection Generic drug:  EPINEPHrine Inject into the muscle.   fexofenadine-pseudoephedrine 60-120 MG 12 hr tablet Commonly known as:  ALLEGRA-D Take by mouth.   fluticasone 50 MCG/ACT nasal spray Commonly known as:  FLONASE Place into the nose.   HYDROcodone-acetaminophen 10-325 MG tablet Commonly known as:  NORCO   levocetirizine 5 MG tablet Commonly known as:  XYZAL Take 5 mg by mouth.   LOW-OGESTREL 0.3-30 MG-MCG tablet Generic drug:  norgestrel-ethinyl estradiol TK UTD   montelukast 10 MG tablet  Commonly known as:  SINGULAIR Take 10 mg by mouth.   OMNARIS 50 MCG/ACT nasal spray Generic drug:  ciclesonide Place into the nose.   oxyCODONE-acetaminophen 10-325 MG tablet Commonly known as:  PERCOCET Take 1 tablet by mouth every 4 (four) hours as needed for pain.   potassium citrate 10 MEQ (1080 MG) SR tablet Commonly known as:  UROCIT-K Take 1 tablet (10 mEq total) by mouth 3 (three) times daily with meals.   Vitamin D (Ergocalciferol) 50000 units Caps capsule Commonly known as:  DRISDOL TK ONE C PO ONCE WEEKLY FOR VITAMIN-D DEFICIENCY        Allergies: No Known Allergies  Family History: Family History  Problem Relation Age of Onset  . Kidney Stones Father   . Kidney Stones Paternal Grandmother   . Kidney disease Maternal Uncle   . Bladder Cancer Neg Hx   . Kidney cancer Neg Hx   . Prostate cancer Neg Hx     Social History:  reports that she has never smoked. She has never used smokeless tobacco. She reports that she drinks alcohol. She reports that she does not use drugs.  ROS: UROLOGY Frequent Urination?: No Hard to postpone urination?: No Burning/pain with urination?: No Get up at night to urinate?: No Leakage of urine?: No Urine stream starts and stops?: No Trouble starting stream?: No Do you have to strain to urinate?: No Blood in urine?: No Urinary tract infection?: No Sexually transmitted disease?: No Injury to kidneys or bladder?: No Painful intercourse?: No Weak stream?: No Currently pregnant?: No Vaginal bleeding?: No Last menstrual period?: n  Gastrointestinal Nausea?: No Vomiting?: No Indigestion/heartburn?: No Diarrhea?: No Constipation?: No  Constitutional Fever: No Night sweats?: No Weight loss?: No Fatigue?: No  Skin Skin rash/lesions?: No Itching?: No  Eyes Blurred vision?: No Double vision?: No  Ears/Nose/Throat Sore throat?: No Sinus problems?: No  Hematologic/Lymphatic Swollen glands?: No Easy bruising?: No  Cardiovascular Leg swelling?: No Chest pain?: No  Respiratory Cough?: No Shortness of breath?: No  Endocrine Excessive thirst?: No  Musculoskeletal Back pain?: No Joint pain?: No  Neurological Headaches?: No Dizziness?: No  Psychologic Depression?: No Anxiety?: No  Physical Exam: BP 108/71   Pulse 81   Ht 5\' 1"  (1.549 m)   Wt 103 lb 4.8 oz (46.9 kg)   LMP 02/21/2017 (Exact Date)   BMI 19.52 kg/m   Constitutional: Well nourished. Alert and oriented, No acute distress. HEENT: [place] AT, moist mucus membranes. Trachea midline, no  masses. Cardiovascular: No clubbing, cyanosis, or edema. Respiratory: Normal respiratory effort, no increased work of breathing. Skin: No rashes, bruises or suspicious lesions. Lymph: No cervical or inguinal adenopathy. Neurologic: Grossly intact, no focal deficits, moving all 4 extremities. Psychiatric: Normal mood and affect.  Laboratory Data: Lab Results  Component Value Date   WBC 5.4 02/02/2017   HGB 13.3 02/02/2017   HCT 41.1 02/02/2017   MCV 98 (H) 02/02/2017   PLT 276 02/02/2017    Lab Results  Component Value Date   CREATININE 0.69 02/02/2017    I have reviewed the lab  Assessment & Plan:    1. Low urine volume  - encouraged patient to increase her water intake to 2.5 L (10 to 12 cups) daily - she states that she was doing well      2. Hypocitraturia  - continue Urocit-K 10 mEq tid - advised of side effect of stomach upset and electrolyte disorders  - RTC in 4 months for UA, CBC and BMP  3. Right renal stones  - KUB's yearly  - Advised to contact our office or seek treatment in the ED if becomes febrile or pain/ vomiting are difficult control in order to arrange for emergent/urgent intervention   Return in about 4 months (around 07/05/2017) for BMP, CBC and UA only.  These notes generated with voice recognition software. I apologize for typographical errors.  Zara Council, [facility] [address] [place], [place] [address] [phone]

## 2017-03-04 ENCOUNTER — Ambulatory Visit
Admission: RE | Admit: 2017-03-04 | Discharge: 2017-03-04 | Disposition: A | Discharge status: Home / Self Care | Payer: BLUE CROSS/BLUE SHIELD | Source: Ambulatory Visit | Attending: Urology | Admitting: Urology

## 2017-03-04 ENCOUNTER — Ambulatory Visit (INDEPENDENT_AMBULATORY_CARE_PROVIDER_SITE_OTHER): Payer: BLUE CROSS/BLUE SHIELD | Admitting: Urology

## 2017-03-04 ENCOUNTER — Encounter: Payer: Self-pay | Admitting: Urology

## 2017-03-04 VITALS — BP 108/71 | HR 81 | Ht 61.0 in | Wt 103.3 lb

## 2017-03-04 DIAGNOSIS — R8299 Other abnormal findings in urine: Secondary | ICD-10-CM | POA: Diagnosis not present

## 2017-03-04 DIAGNOSIS — N200 Calculus of kidney: Secondary | ICD-10-CM | POA: Diagnosis not present

## 2017-03-04 DIAGNOSIS — R82991 Hypocitraturia: Secondary | ICD-10-CM

## 2017-03-04 DIAGNOSIS — R34 Anuria and oliguria: Secondary | ICD-10-CM

## 2017-07-05 ENCOUNTER — Other Ambulatory Visit: Payer: BLUE CROSS/BLUE SHIELD

## 2017-07-05 ENCOUNTER — Other Ambulatory Visit: Payer: Self-pay

## 2017-07-05 DIAGNOSIS — R82991 Hypocitraturia: Secondary | ICD-10-CM

## 2017-07-05 DIAGNOSIS — R34 Anuria and oliguria: Secondary | ICD-10-CM

## 2017-07-05 DIAGNOSIS — N200 Calculus of kidney: Secondary | ICD-10-CM

## 2017-07-06 ENCOUNTER — Telehealth: Payer: Self-pay

## 2017-07-06 DIAGNOSIS — R3129 Other microscopic hematuria: Secondary | ICD-10-CM

## 2017-07-06 LAB — MICROSCOPIC EXAMINATION

## 2017-07-06 LAB — BASIC METABOLIC PANEL
BUN/Creatinine Ratio: 17 (ref 9–23)
BUN: 11 mg/dL (ref 6–20)
CALCIUM: 9.3 mg/dL (ref 8.7–10.2)
CHLORIDE: 106 mmol/L (ref 96–106)
CO2: 22 mmol/L (ref 20–29)
CREATININE: 0.63 mg/dL (ref 0.57–1.00)
GFR calc Af Amer: 140 mL/min/{1.73_m2} (ref >59)
GFR calc non Af Amer: 122 mL/min/{1.73_m2} (ref >59)
Glucose: 98 mg/dL (ref 65–99)
POTASSIUM: 3.9 mmol/L (ref 3.5–5.2)
SODIUM: 141 mmol/L (ref 134–144)

## 2017-07-06 LAB — CBC
HEMATOCRIT: 38.2 % (ref 34.0–46.6)
HEMOGLOBIN: 13 g/dL (ref 11.1–15.9)
MCH: 32.1 pg (ref 26.6–33.0)
MCHC: 34 g/dL (ref 31.5–35.7)
MCV: 94 fL (ref 79–97)
Platelets: 306 10*3/uL (ref 150–379)
RBC: 4.05 x10E6/uL (ref 3.77–5.28)
RDW: 12.9 % (ref 12.3–15.4)
WBC: 5.3 10*3/uL (ref 3.4–10.8)

## 2017-07-06 LAB — URINALYSIS, COMPLETE
BILIRUBIN UA: NEGATIVE
Glucose, UA: NEGATIVE
Nitrite, UA: NEGATIVE
RBC UA: NEGATIVE
SPEC GRAV UA: 1.025 (ref 1.005–1.030)
Urobilinogen, Ur: 1 mg/dL (ref 0.2–1.0)
pH, UA: 7 (ref 5.0–7.5)

## 2017-07-06 NOTE — Telephone Encounter (Signed)
Pt returned call stating she was NOT on her period at the time of sample.

## 2017-07-06 NOTE — Telephone Encounter (Signed)
LMOM

## 2017-07-06 NOTE — Telephone Encounter (Signed)
-----   Message from Nori Riis, PA-C sent at 07/06/2017  7:30 AM EST ----- Please let [name] know that her labs are normal except there was blood in her urine.  Was she on her period?

## 2017-07-27 NOTE — Telephone Encounter (Signed)
It has been a few weeks now, let's recheck her urine to make sure the blood in the urine is not persisting.

## 2017-07-28 NOTE — Telephone Encounter (Signed)
LMOM

## 2017-07-29 ENCOUNTER — Other Ambulatory Visit: Payer: BLUE CROSS/BLUE SHIELD

## 2017-07-29 DIAGNOSIS — R3129 Other microscopic hematuria: Secondary | ICD-10-CM

## 2017-07-30 ENCOUNTER — Telehealth: Payer: Self-pay

## 2017-07-30 LAB — URINALYSIS, COMPLETE
BILIRUBIN UA: NEGATIVE
GLUCOSE, UA: NEGATIVE
Ketones, UA: NEGATIVE
Nitrite, UA: NEGATIVE
PH UA: 6 (ref 5.0–7.5)
PROTEIN UA: NEGATIVE
RBC, UA: NEGATIVE
Specific Gravity, UA: 1.025 (ref 1.005–1.030)
UUROB: 0.2 mg/dL (ref 0.2–1.0)

## 2017-07-30 LAB — MICROSCOPIC EXAMINATION

## 2017-07-30 NOTE — Telephone Encounter (Signed)
-----   Message from Nori Riis, PA-C sent at 07/30/2017  7:46 AM EST ----- Please let Leena know that her urine was negative.

## 2017-07-30 NOTE — Telephone Encounter (Signed)
LMOM- urine negative.

## 2017-09-09 ENCOUNTER — Other Ambulatory Visit: Payer: Self-pay | Admitting: Nurse Practitioner

## 2017-10-21 IMAGING — CT CT RENAL STONE PROTOCOL
1 of 2 series · 15 of 32 positions shown, 19 images · non-contrast
Comparison: 01/05/2014

CLINICAL DATA: Left flank pain, dysuria, and microscopic hematuria
for 3 weeks. Nephrolithiasis.

EXAM:
CT ABDOMEN AND PELVIS WITHOUT CONTRAST
TECHNIQUE: Multidetector CT imaging of the abdomen and pelvis was performed
following the standard protocol without IV contrast.

[Series 2: axial st · axial · 0.58mm/px · z∈[-1024,-688]mm · 15 of 75 slices shown, 19 images]
[im 4/75  soft-tissue]
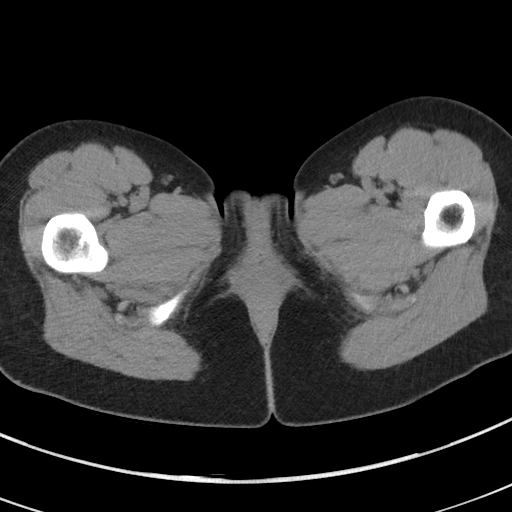
[im 4/75  bone]
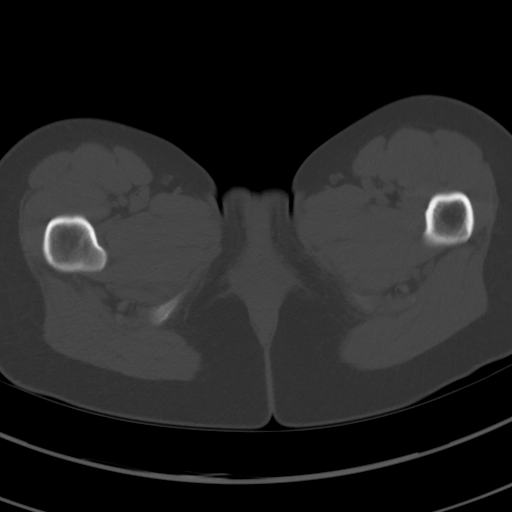
[im 10/75  soft-tissue]
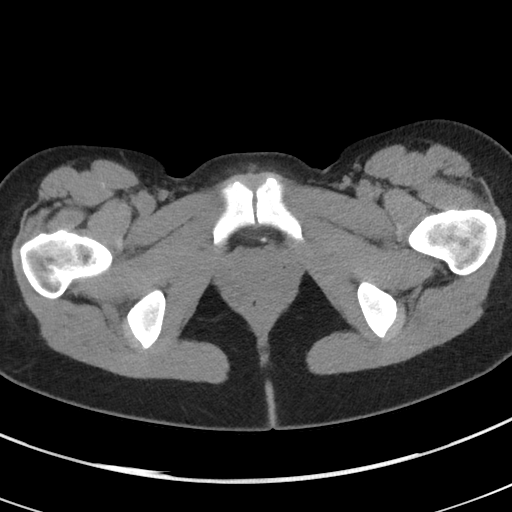
[im 16/75  soft-tissue]
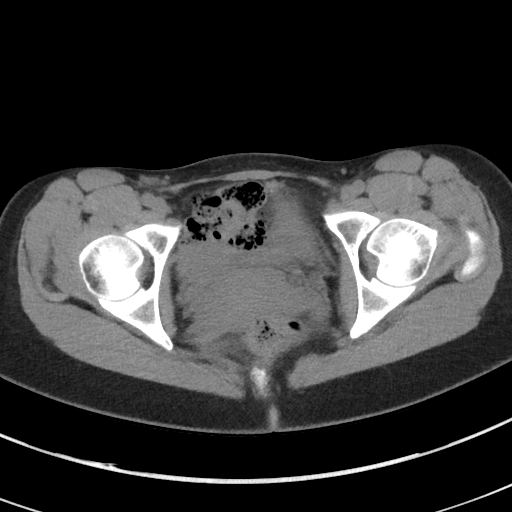
[im 22/75  soft-tissue]
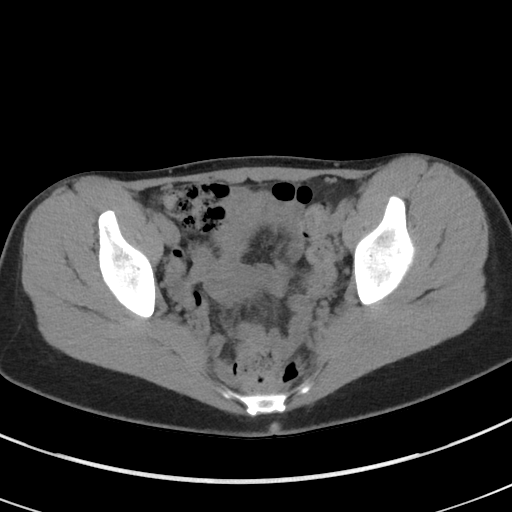
[im 25/75  soft-tissue]
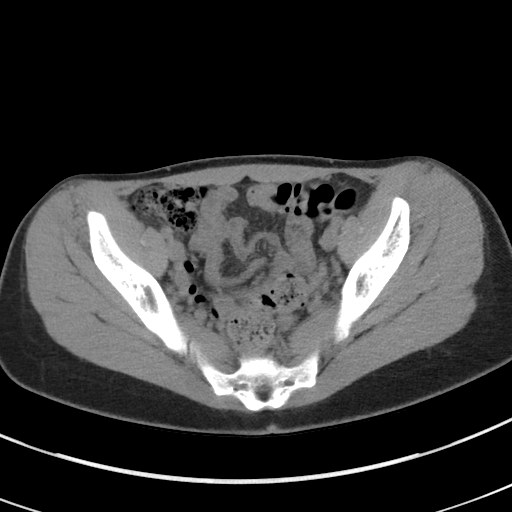
[im 31/75  soft-tissue]
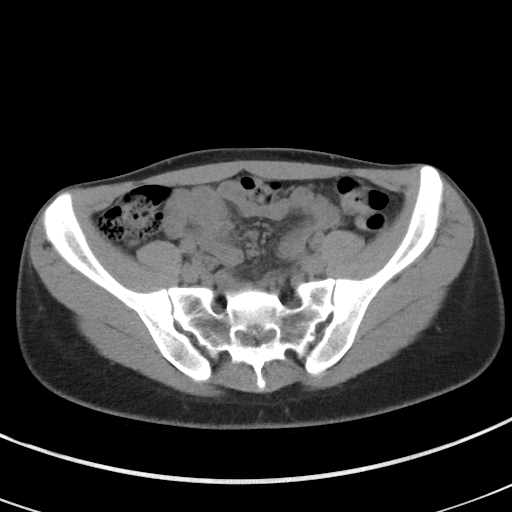
[im 38/75  soft-tissue]
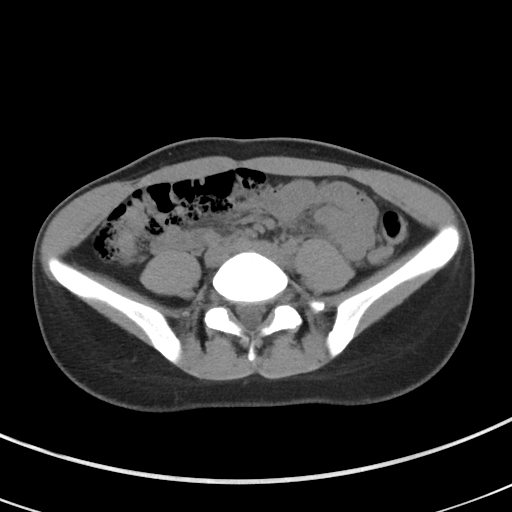
[im 44/75  soft-tissue]
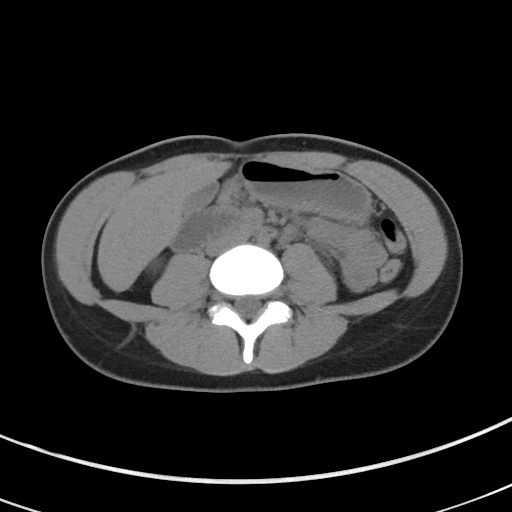
[im 50/75  soft-tissue]
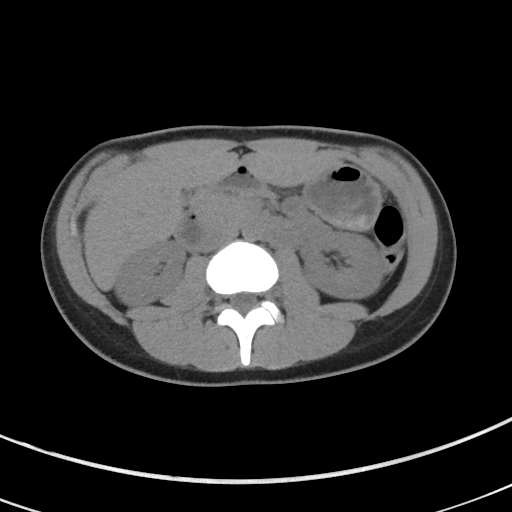
[im 50/75  bone]
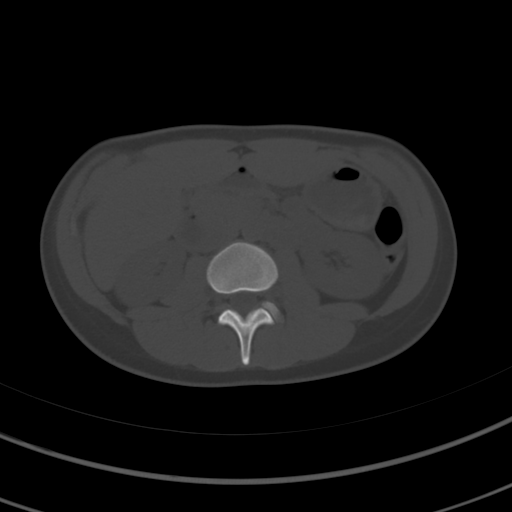
[im 53/75  soft-tissue]
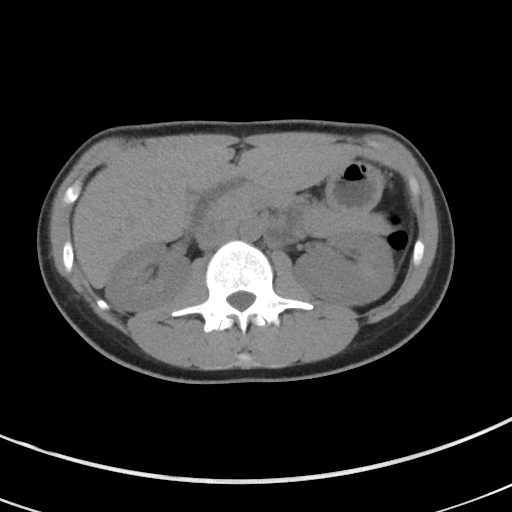
[im 59/75  soft-tissue]
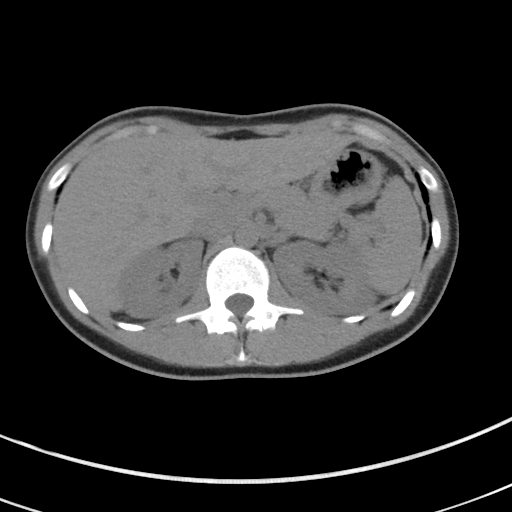
[im 62/75  lung]
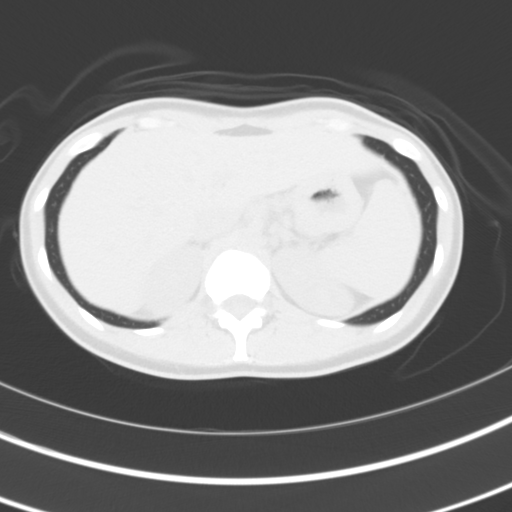
[im 65/75  soft-tissue]
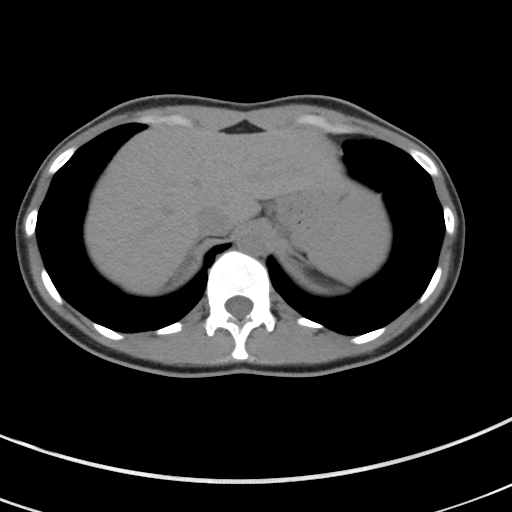
[im 65/75  lung]
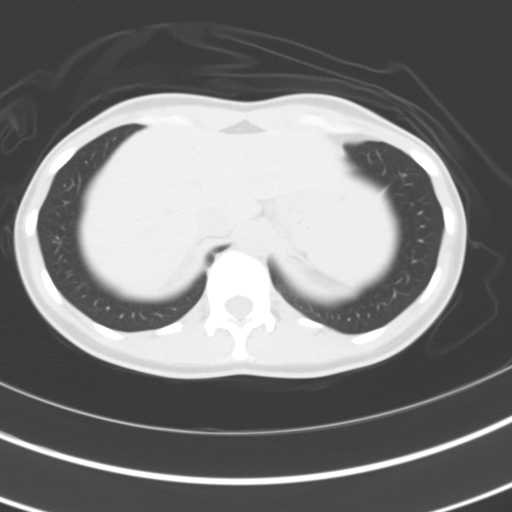
[im 68/75  lung]
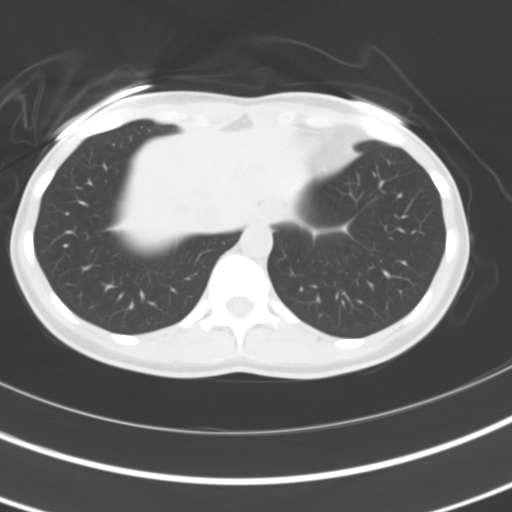
[im 71/75  soft-tissue]
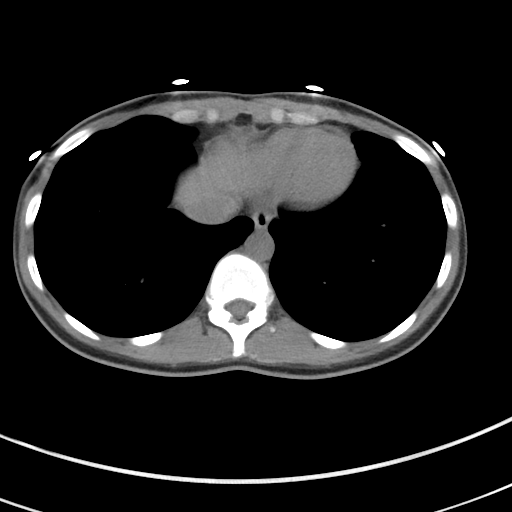
[im 71/75  lung]
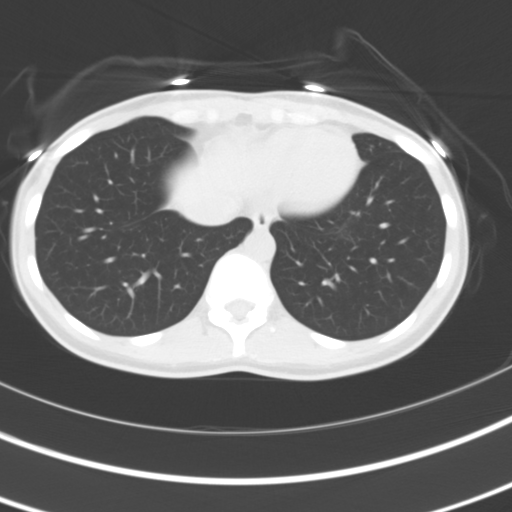

[15 of 32 positions shown; findings below may reference images not displayed]

FINDINGS: Lower chest:  No acute findings.

Hepatobiliary: No mass visualized on this un-enhanced exam.
Gallbladder is unremarkable.

Pancreas: No mass or inflammatory process identified on this
un-enhanced exam.

Spleen: Within normal limits in size.

Adrenals/Urinary Tract: 1 mm calculus seen in midpole of right
kidney. No evidence of hydronephrosis. No evidence of ureteral
calculi or dilatation. No bladder calculi identified.

Stomach/Bowel: No evidence of obstruction, inflammatory process, or
abnormal fluid collections. Normal appendix visualized.

Vascular/Lymphatic: No pathologically enlarged lymph nodes. No
evidence of abdominal aortic aneurysm.

Reproductive: No mass or other significant abnormality.

Other: None.

Musculoskeletal:  No suspicious bone lesions identified.
IMPRESSION: Stable tiny nonobstructive right renal calculus. No evidence of
ureteral calculi, hydronephrosis, or other acute findings.

## 2017-11-29 ENCOUNTER — Encounter: Payer: Self-pay | Admitting: Nurse Practitioner

## 2017-11-29 ENCOUNTER — Ambulatory Visit: Payer: BLUE CROSS/BLUE SHIELD | Admitting: Nurse Practitioner

## 2017-11-29 VITALS — BP 113/67 | HR 89 | Resp 16 | Ht 61.0 in | Wt 108.0 lb

## 2017-11-29 DIAGNOSIS — E559 Vitamin D deficiency, unspecified: Secondary | ICD-10-CM | POA: Diagnosis not present

## 2017-11-29 DIAGNOSIS — R5383 Other fatigue: Secondary | ICD-10-CM

## 2017-11-29 DIAGNOSIS — R7301 Impaired fasting glucose: Secondary | ICD-10-CM | POA: Diagnosis not present

## 2017-11-29 NOTE — Progress Notes (Signed)
[facility] [place], [place] [address]  Internal MEDICINE  Office Visit Note  Patient Name: Mariah Bird  [id number]  [id number]  Date of Service: 11/29/2017  Chief Complaint  Patient presents with  . Fatigue    nothing has changed still very tired      The patient is here for sick visit. Her complaint today is fatigue. Has been going on for several months. She states that she goes to work and comes home and goes to bed. Doesn't do anything else unless she has some sort of errand to run. Denies shortness of breath or fever. No headaches, no nausea, vomiting or diarrhea. Menstrual periods are light and regular. Currently on birth control pills .  Pt is here for a sick visit.     Current Medication:  Outpatient Encounter Medications as of 11/29/2017  Medication Sig Note  . cyclobenzaprine (FLEXERIL) 10 MG tablet  02/02/2017: PRN  . EPINEPHrine (EPIPEN 2-PAK) 0.3 mg/0.3 mL IJ SOAJ injection Inject into the muscle.   . LOW-OGESTREL 0.3-30 MG-MCG tablet TK UTD 03/23/2016: Received from: External Pharmacy  . potassium citrate (UROCIT-K) 10 MEQ (1080 MG) SR tablet Take 1 tablet (10 mEq total) by mouth 3 (three) times daily with meals.   . Vitamin D, Ergocalciferol, (DRISDOL) 50000 units CAPS capsule TK ONE C PO ONCE WEEKLY FOR VITAMIN-D DEFICIENCY 03/23/2016: Received from: External Pharmacy  . norethindrone-ethinyl estradiol 1/35 (CYCLAFEM 1/35) tablet    . [DISCONTINUED] Azelastine HCl (ASTEPRO) 0.15 % SOLN Place into the nose.   . [DISCONTINUED] ciclesonide (OMNARIS) 50 MCG/ACT nasal spray Place into the nose.   . [DISCONTINUED] fexofenadine-pseudoephedrine (ALLEGRA-D) 60-120 MG 12 hr tablet Take by mouth.   . [DISCONTINUED] fluticasone (FLONASE) 50 MCG/ACT nasal spray Place into the nose.   . [DISCONTINUED] HYDROcodone-acetaminophen (NORCO) 10-325 MG tablet    . [DISCONTINUED] levocetirizine (XYZAL) 5 MG tablet Take 5 mg by mouth.   . [DISCONTINUED]  montelukast (SINGULAIR) 10 MG tablet Take 10 mg by mouth.   . [DISCONTINUED] oxyCODONE-acetaminophen (PERCOCET) 10-325 MG tablet Take 1 tablet by mouth every 4 (four) hours as needed for pain. (Patient not taking: Reported on 03/04/2017) 02/02/2017: PRN   No facility-administered encounter medications on file as of 11/29/2017.       Medical History: Past Medical History:  Diagnosis Date  . Nephrolithiasis      Today's Vitals   11/29/17 1430  BP: 113/67  Pulse: 89  Resp: 16  SpO2: 98%  Weight: 108 lb (49 kg)  Height: 5\' 1"  (1.549 m)    Review of Systems  Constitutional: Positive for activity change and fatigue. Negative for chills and unexpected weight change.  HENT: Negative for congestion, postnasal drip, rhinorrhea, sneezing and sore throat.   Eyes: Negative.  Negative for redness.  Respiratory: Negative for cough, chest tightness, shortness of breath and wheezing.   Cardiovascular: Negative for chest pain and palpitations.  Gastrointestinal: Negative for abdominal pain, constipation, diarrhea, nausea and vomiting.  Endocrine: Negative for cold intolerance, heat intolerance, polydipsia, polyphagia and polyuria.  Genitourinary: Negative for dysuria and frequency.  Musculoskeletal: Negative for arthralgias, back pain, joint swelling and neck pain.  Skin: Negative for rash.  Neurological: Negative.  Negative for tremors and numbness.  Hematological: Negative for adenopathy. Does not bruise/bleed easily.  Psychiatric/Behavioral: Negative for behavioral problems (Depression), sleep disturbance and suicidal ideas. The patient is not nervous/anxious.     Physical Exam  Constitutional: She is oriented to person, place, and time. She appears well-developed  and well-nourished. No distress.  HENT:  Head: Normocephalic and atraumatic.  Mouth/Throat: Oropharynx is clear and moist. No oropharyngeal exudate.  Eyes: Pupils are equal, round, and reactive to light. EOM are normal.  Neck:  Normal range of motion. Neck supple. No JVD present. No tracheal deviation present. No thyromegaly present.  Cardiovascular: Normal rate, regular rhythm and normal heart sounds. Exam reveals no gallop and no friction rub.  No murmur heard. Pulmonary/Chest: Effort normal. No respiratory distress. She has no wheezes. She has no rales. She exhibits no tenderness.  Abdominal: Soft. Bowel sounds are normal. There is no tenderness.  Musculoskeletal: Normal range of motion.  Lymphadenopathy:    She has no cervical adenopathy.  Neurological: She is alert and oriented to person, place, and time. No cranial nerve deficit.  Skin: Skin is warm and dry. She is not diaphoretic.  Psychiatric: She has a normal mood and affect. Her behavior is normal. Judgment and thought content normal.   Assessment/Plan:  1. Other fatigue Physical assessment normal. Check labs and discuss when results are available.  - CBC With Differential - Comprehensive metabolic panel - TSH + free T4 - Iron, TIBC and Ferritin Panel - Vitamin B12 - Hemoglobin A1c  2. Vitamin D deficiency - Vitamin D 1,25 dihydroxy  3. Impaired fasting glucose - Hemoglobin A1c   General Counseling: Ottilia verbalizes understanding of the findings of todays visit and agrees with plan of treatment. I have discussed any further diagnostic evaluation that may be needed or ordered today. We also reviewed her medications today. she has been encouraged to call the office with any questions or concerns that should arise related to todays visit.  This patient was seen by Leretha Pol, FNP- C in Collaboration with Dr Lavera Guise as a part of collaborative care agreement    Orders Placed This Encounter  Procedures  . CBC With Differential  . Comprehensive metabolic panel  . TSH + free T4  . Iron, TIBC and Ferritin Panel  . Vitamin B12  . Vitamin D 1,25 dihydroxy  . Hemoglobin A1c      Time spent: 15 Minutes

## 2017-12-13 ENCOUNTER — Other Ambulatory Visit: Payer: Self-pay | Admitting: Nurse Practitioner

## 2017-12-15 LAB — EPSTEIN-BARR VIRUS VCA ANTIBODY PANEL
EBV Early Antigen Ab, IgG: <9 U/mL (ref 0.0–8.9)
EBV NA IgG: >600 U/mL — ABNORMAL HIGH (ref 0.0–17.9)
EBV VCA IgG: >600 U/mL — ABNORMAL HIGH (ref 0.0–17.9)
EBV VCA IgM: <36 U/mL (ref 0.0–35.9)

## 2017-12-15 LAB — COMPREHENSIVE METABOLIC PANEL
A/G RATIO: 1.3 (ref 1.2–2.2)
ALBUMIN: 4.5 g/dL (ref 3.5–5.5)
ALT: 12 IU/L (ref 0–32)
AST: 15 IU/L (ref 0–40)
Alkaline Phosphatase: 85 IU/L (ref 39–117)
BUN / CREAT RATIO: 17 (ref 9–23)
BUN: 12 mg/dL (ref 6–20)
Bilirubin Total: 0.3 mg/dL (ref 0.0–1.2)
CO2: 23 mmol/L (ref 20–29)
Calcium: 9.8 mg/dL (ref 8.7–10.2)
Chloride: 106 mmol/L (ref 96–106)
Creatinine, Ser: 0.71 mg/dL (ref 0.57–1.00)
GFR calc non Af Amer: 115 mL/min/{1.73_m2} (ref >59)
GFR, EST AFRICAN AMERICAN: 132 mL/min/{1.73_m2} (ref >59)
Globulin, Total: 3.4 g/dL (ref 1.5–4.5)
Glucose: 93 mg/dL (ref 65–99)
Potassium: 4.5 mmol/L (ref 3.5–5.2)
Sodium: 142 mmol/L (ref 134–144)
TOTAL PROTEIN: 7.9 g/dL (ref 6.0–8.5)

## 2017-12-15 LAB — T4, FREE: Free T4: 1.23 ng/dL (ref 0.82–1.77)

## 2017-12-15 LAB — IRON AND TIBC
IRON SATURATION: 31 % (ref 15–55)
Iron: 100 ug/dL (ref 27–159)
TIBC: 325 ug/dL (ref 250–450)
UIBC: 225 ug/dL (ref 131–425)

## 2017-12-15 LAB — CBC
HEMATOCRIT: 41.4 % (ref 34.0–46.6)
HEMOGLOBIN: 14 g/dL (ref 11.1–15.9)
MCH: 32.2 pg (ref 26.6–33.0)
MCHC: 33.8 g/dL (ref 31.5–35.7)
MCV: 95 fL (ref 79–97)
Platelets: 310 10*3/uL (ref 150–379)
RBC: 4.35 x10E6/uL (ref 3.77–5.28)
RDW: 12.7 % (ref 12.3–15.4)
WBC: 3.8 10*3/uL (ref 3.4–10.8)

## 2017-12-15 LAB — B12 AND FOLATE PANEL
Folate: 12.1 ng/mL (ref >3.0)
Vitamin B-12: 630 pg/mL (ref 232–1245)

## 2017-12-15 LAB — HGB A1C W/O EAG: Hgb A1c MFr Bld: 5.1 % (ref 4.8–5.6)

## 2017-12-15 LAB — TSH: TSH: 1.95 u[IU]/mL (ref 0.450–4.500)

## 2017-12-15 LAB — FERRITIN: FERRITIN: 62 ng/mL (ref 15–150)

## 2017-12-15 LAB — VITAMIN D 25 HYDROXY (VIT D DEFICIENCY, FRACTURES): Vit D, 25-Hydroxy: 33.1 ng/mL (ref 30.0–100.0)

## 2017-12-20 ENCOUNTER — Telehealth: Payer: Self-pay

## 2017-12-20 NOTE — Telephone Encounter (Signed)
Please let the patient know that her labs look great. Epstein barr panel does show that she has had this infection, but currently in recovery phase, so hopefully her symptoms should be improving. All other labs were norma. thanks

## 2017-12-21 NOTE — Telephone Encounter (Signed)
Pt advised for labs that her labs look great. Epstein barr panel does show that she has had this infection, but currently in recovery phase, so hopefully her symptoms should be improving. All other labs were normal

## 2018-02-25 ENCOUNTER — Other Ambulatory Visit: Payer: Self-pay | Admitting: Nurse Practitioner

## 2018-03-07 ENCOUNTER — Ambulatory Visit: Payer: BLUE CROSS/BLUE SHIELD | Admitting: Adult Health

## 2018-03-07 ENCOUNTER — Encounter: Payer: Self-pay | Admitting: Adult Health

## 2018-03-07 VITALS — BP 112/68 | HR 92 | Resp 16 | Ht 61.0 in | Wt 108.4 lb

## 2018-03-07 DIAGNOSIS — Z0001 Encounter for general adult medical examination with abnormal findings: Secondary | ICD-10-CM

## 2018-03-07 DIAGNOSIS — R300 Dysuria: Secondary | ICD-10-CM | POA: Diagnosis not present

## 2018-03-07 DIAGNOSIS — Z020 Encounter for examination for admission to educational institution: Secondary | ICD-10-CM | POA: Diagnosis not present

## 2018-03-07 NOTE — Progress Notes (Signed)
[facility], [place] [address]  Internal MEDICINE  Office Visit Note  Patient Name: Mariah Bird  [id number]  [id number]  Date of Service: 03/11/2018  Chief Complaint  Patient presents with  . Annual Exam     HPI Pt is here for routine health maintenance examination for school.  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  She needs order for TB bloodwork and hep B titer.     Current Medication: Outpatient Encounter Medications as of 03/07/2018  Medication Sig Note  . cyclobenzaprine (FLEXERIL) 10 MG tablet  02/02/2017: PRN  . EPINEPHrine (EPIPEN 2-PAK) 0.3 mg/0.3 mL IJ SOAJ injection Inject into the muscle.   . LOW-OGESTREL 0.3-30 MG-MCG tablet TK UTD 03/23/2016: Received from: External Pharmacy  . norethindrone-ethinyl estradiol 1/35 (CYCLAFEM 1/35) tablet    . potassium citrate (UROCIT-K) 10 MEQ (1080 MG) SR tablet Take 1 tablet (10 mEq total) by mouth 3 (three) times daily with meals. (Patient not taking: Reported on 03/07/2018)   . Vitamin D, Ergocalciferol, (DRISDOL) 50000 units CAPS capsule TK ONE C PO ONCE WEEKLY FOR VITAMIN-D DEFICIENCY 03/23/2016: Received from: External Pharmacy   No facility-administered encounter medications on file as of 03/07/2018.     Surgical History: Past Surgical History:  Procedure Laterality Date  . KNEE ARTHROSCOPY    . NASAL SINUS SURGERY  12/18/2016    Medical History: Past Medical History:  Diagnosis Date  . Nephrolithiasis     Family History: Family History  Problem Relation Age of Onset  . Kidney Stones Father   . Kidney Stones Paternal Grandmother   . Kidney disease Maternal Uncle   . Bladder Cancer Neg Hx   . Kidney cancer Neg Hx   . Prostate cancer Neg Hx      Review of Systems  Constitutional: Negative for chills, fatigue and unexpected weight change.  HENT: Negative for congestion, rhinorrhea, sneezing and sore throat.   Eyes: Negative for photophobia, pain and redness.   Respiratory: Negative for cough, chest tightness and shortness of breath.   Cardiovascular: Negative for chest pain and palpitations.  Gastrointestinal: Negative for abdominal pain, constipation, diarrhea, nausea and vomiting.  Endocrine: Negative.   Genitourinary: Negative for dysuria and frequency.  Musculoskeletal: Negative for arthralgias, back pain, joint swelling and neck pain.  Skin: Negative for rash.  Allergic/Immunologic: Negative.   Neurological: Negative for tremors and numbness.  Hematological: Negative for adenopathy. Does not bruise/bleed easily.  Psychiatric/Behavioral: Negative for behavioral problems and sleep disturbance. The patient is not nervous/anxious.      Vital Signs: BP 112/68   Pulse 92   Resp 16   Ht 5\' 1"  (1.549 m)   Wt 108 lb 6.4 oz (49.2 kg)   SpO2 95%   BMI 20.48 kg/m    Physical Exam  Constitutional: She is oriented to person, place, and time. She appears well-developed and well-nourished. No distress.  HENT:  Head: Normocephalic and atraumatic.  Mouth/Throat: Oropharynx is clear and moist. No oropharyngeal exudate.  Eyes: Pupils are equal, round, and reactive to light. EOM are normal.  Neck: Normal range of motion. Neck supple. No JVD present. No tracheal deviation present. No thyromegaly present.  Cardiovascular: Normal rate, regular rhythm and normal heart sounds. Exam reveals no gallop and no friction rub.  No murmur heard. Pulmonary/Chest: Effort normal and breath sounds normal. No respiratory distress. She has no wheezes. She has no rales. She exhibits no tenderness. Right breast exhibits no inverted nipple, no mass, no  nipple discharge, no skin change and no tenderness. Left breast exhibits no inverted nipple, no mass, no nipple discharge, no skin change and no tenderness.  Abdominal: Soft. There is no tenderness. There is no guarding.  Musculoskeletal: Normal range of motion.  Lymphadenopathy:    She has no cervical adenopathy.   Neurological: She is alert and oriented to person, place, and time. No cranial nerve deficit.  Skin: Skin is warm and dry. She is not diaphoretic.  Psychiatric: She has a normal mood and affect. Her behavior is normal. Judgment and thought content normal.  Nursing note and vitals reviewed.    LABS: Recent Results (from the past 2160 hour(s))  Comprehensive metabolic panel     Status: None   Collection Time: 12/13/17  9:44 AM  Result Value Ref Range   Glucose 93 65 - 99 mg/dL   BUN 12 6 - 20 mg/dL   Creatinine, Ser 0.71 0.57 - 1.00 mg/dL   GFR calc non Af Amer 115 >59 mL/min/1.73   GFR calc Af Amer 132 >59 mL/min/1.73   BUN/Creatinine Ratio 17 9 - 23   Sodium 142 134 - 144 mmol/L   Potassium 4.5 3.5 - 5.2 mmol/L   Chloride 106 96 - 106 mmol/L   CO2 23 20 - 29 mmol/L   Calcium 9.8 8.7 - 10.2 mg/dL   Total Protein 7.9 6.0 - 8.5 g/dL   Albumin 4.5 3.5 - 5.5 g/dL   Globulin, Total 3.4 1.5 - 4.5 g/dL   Albumin/Globulin Ratio 1.3 1.2 - 2.2   Bilirubin Total 0.3 0.0 - 1.2 mg/dL   Alkaline Phosphatase 85 39 - 117 IU/L   AST 15 0 - 40 IU/L   ALT 12 0 - 32 IU/L  CBC     Status: None   Collection Time: 12/13/17  9:44 AM  Result Value Ref Range   WBC 3.8 3.4 - 10.8 x10E3/uL   RBC 4.35 3.77 - 5.28 x10E6/uL   Hemoglobin 14.0 11.1 - 15.9 g/dL   Hematocrit 41.4 34.0 - 46.6 %   MCV 95 79 - 97 fL   MCH 32.2 26.6 - 33.0 pg   MCHC 33.8 31.5 - 35.7 g/dL   RDW 12.7 12.3 - 15.4 %   Platelets 310 150 - 379 x10E3/uL  Epstein-Barr virus VCA antibody panel     Status: Abnormal   Collection Time: 12/13/17  9:44 AM  Result Value Ref Range   EBV VCA IgM <36.0 0.0 - 35.9 U/mL    Comment:                                  Negative        <36.0                                  Equivocal 36.0 - 43.9                                  Positive        >43.9    EBV Early Antigen Ab, IgG <9.0 0.0 - 8.9 U/mL    Comment:  Negative        < 9.0                                   Equivocal  9.0 - 10.9                                  Positive        >10.9    EBV VCA IgG >600.0 (H) 0.0 - 17.9 U/mL    Comment:                                  Negative        <18.0                                  Equivocal 18.0 - 21.9                                  Positive        >21.9    EBV NA IgG >600.0 (H) 0.0 - 17.9 U/mL    Comment:                                  Negative        <18.0                                  Equivocal 18.0 - 21.9                                  Positive        >21.9    Interpretation: Comment     Comment:                EBV Interpretation Chart Interpretation   EBV-IgM  EA(D)-IgG  VCA-IgG  EBNA-IgG EBV Seronegative    -        -         -          - Early Phase         +        -         -          - Acute Primary       +       +or-       +          - Infection Convalescence/Past  -       +or-       +          + Infection Reactivated        +or-     +or-       +          + Infection        + Antibody Present      - Antibody Absent   Iron and TIBC     Status: None   Collection  Time: 12/13/17  9:44 AM  Result Value Ref Range   Total Iron Binding Capacity 325 250 - 450 ug/dL   UIBC 225 131 - 425 ug/dL   Iron 100 27 - 159 ug/dL   Iron Saturation 31 15 - 55 %  B12 and Folate Panel     Status: None   Collection Time: 12/13/17  9:44 AM  Result Value Ref Range   Vitamin B-12 630 232 - 1,245 pg/mL   Folate 12.1 >3.0 ng/mL    Comment: A serum folate concentration of less than 3.1 ng/mL is considered to represent clinical deficiency.   Hgb A1c w/o eAG     Status: None   Collection Time: 12/13/17  9:44 AM  Result Value Ref Range   Hgb A1c MFr Bld 5.1 4.8 - 5.6 %    Comment:          Prediabetes: 5.7 - 6.4          Diabetes: >6.4          Glycemic control for adults with diabetes: <7.0   T4, free     Status: None   Collection Time: 12/13/17  9:44 AM  Result Value Ref Range   Free T4 1.23 0.82 - 1.77 ng/dL  TSH     Status: None    Collection Time: 12/13/17  9:44 AM  Result Value Ref Range   TSH 1.950 0.450 - 4.500 uIU/mL  VITAMIN D 25 Hydroxy (Vit-D Deficiency, Fractures)     Status: None   Collection Time: 12/13/17  9:44 AM  Result Value Ref Range   Vit D, 25-Hydroxy 33.1 30.0 - 100.0 ng/mL    Comment: Vitamin D deficiency has been defined by the [place] and an Endocrine Society practice guideline as a level of serum 25-OH vitamin D less than 20 ng/mL (1,2). The Endocrine Society went on to further define vitamin D insufficiency as a level between 21 and 29 ng/mL (2). 1. IOM (Institute of Medicine). 2010. Dietary reference    intakes for calcium and D. [place]: The    [employer]. 2. Holick MF, Binkley [place], Bischoff-Ferrari HA, et al.    Evaluation, treatment, and prevention of vitamin D    deficiency: an Endocrine Society clinical practice    guideline. JCEM. 2011 Jul; 96(7):1911-30.   Ferritin     Status: None   Collection Time: 12/13/17  9:44 AM  Result Value Ref Range   Ferritin 62 15 - 150 ng/mL  UA/M w/rflx Culture, Routine     Status: Abnormal   Collection Time: 03/07/18  8:43 AM  Result Value Ref Range   Specific Gravity, UA 1.024 1.005 - 1.030   pH, UA 6.5 5.0 - 7.5   Color, UA Yellow Yellow   Appearance Ur Clear Clear   Leukocytes, UA Negative Negative   Protein, UA Negative Negative/Trace   Glucose, UA Negative Negative   Ketones, UA Negative Negative   RBC, UA Trace (A) Negative   Bilirubin, UA Negative Negative   Urobilinogen, Ur 0.2 0.2 - 1.0 mg/dL   Nitrite, UA Negative Negative   Microscopic Examination See below:     Comment: Microscopic was indicated and was performed.   Urinalysis Reflex Comment     Comment: This specimen will not reflex to a Urine Culture.  Microscopic Examination     Status: Abnormal   Collection Time: 03/07/18  8:43 AM  Result Value Ref Range   WBC, UA 0-5 0 - 5 /hpf  RBC, UA 3-10 (A) 0 - 2 /hpf   Epithelial Cells (non  renal) 0-10 0 - 10 /hpf   Casts None seen None seen /lpf   Mucus, UA Present Not Estab.   Bacteria, UA Few None seen/Few  Specimen status report     Status: None (Preliminary result)   Collection Time: 03/07/18  8:43 AM  Result Value Ref Range   specimen status report Comment     Comment: No Micro Urine Received  QuantiFERON-TB Gold Plus     Status: None (Preliminary result)   Collection Time: 03/08/18  4:52 PM  Result Value Ref Range   QuantiFERON Incubation WILL FOLLOW    QuantiFERON Criteria WILL FOLLOW    QuantiFERON TB1 Ag Value WILL FOLLOW    QuantiFERON TB2 Ag Value WILL FOLLOW    QuantiFERON Nil Value WILL FOLLOW    QuantiFERON Mitogen Value WILL FOLLOW    QuantiFERON-TB Gold Plus WILL FOLLOW   Hepatitis B surface antibody     Status: None   Collection Time: 03/08/18  4:52 PM  Result Value Ref Range   Hep B Surface Ab, Qual Reactive     Comment:               Non Reactive: Inconsistent with immunity,                             less than 10 mIU/mL               Reactive:     Consistent with immunity,                             greater than 9.9 mIU/mL   Hepatitis B surface antibody     Status: None   Collection Time: 03/08/18  4:52 PM  Result Value Ref Range   Hepatitis B Surf Ab Quant 16.5 Immunity>9.9 mIU/mL    Comment:   Status of Immunity                     Anti-HBs Level   ------------------                     -------------- Inconsistent with Immunity                   0.0 - 9.9 Consistent with Immunity                          >9.9     Assessment/Plan: 1. Encounter for general adult medical examination with abnormal findings Pt here for School Admission Physical.  Denies complaints.  - QuantiFERON-TB Gold Plus -Hep B titer 2. Dysuria UA/M w/rflx Culture, Routine 3. Encounter for school health examination Paperwork completed for School health exam.   General Counseling: stephie xu understanding of the findings of todays visit and agrees with  plan of treatment. I have discussed any further diagnostic evaluation that may be needed or ordered today. We also reviewed her medications today. she has been encouraged to call the office with any questions or concerns that should arise related to todays visit.   Orders Placed This Encounter  Procedures  . Microscopic Examination  . QuantiFERON-TB Gold Plus  . UA/M w/rflx Culture, Routine  . Specimen status report  . Hepatitis B surface antibody  . Hepatitis B surface antibody  Time spent: 25 Minutes   This patient was seen by Orson Gear AGNP-C in Collaboration with Dr Lavera Guise as a part of collaborative care agreement   Lavera Guise, MD  Internal Medicine

## 2018-03-08 LAB — SPECIMEN STATUS REPORT

## 2018-03-13 LAB — QUANTIFERON-TB GOLD PLUS
QUANTIFERON NIL VALUE: 0.02 [IU]/mL
QUANTIFERON TB2 AG VALUE: 0.03 [IU]/mL
QuantiFERON Mitogen Value: >10 IU/mL
QuantiFERON TB1 Ag Value: 0.02 IU/mL
QuantiFERON-TB Gold Plus: NEGATIVE

## 2018-03-13 LAB — HEPATITIS B SURFACE ANTIBODY, QUANTITATIVE: Hepatitis B Surf Ab Quant: 16.5 m[IU]/mL (ref >9.9)

## 2018-03-13 LAB — HEPATITIS B SURFACE ANTIBODY,QUALITATIVE: Hep B Surface Ab, Qual: REACTIVE

## 2018-03-15 ENCOUNTER — Telehealth: Payer: Self-pay

## 2018-03-15 NOTE — Telephone Encounter (Signed)
-----   Message from Kendell Bane, NP sent at 03/15/2018  9:22 AM EDT ----- Regarding: Results Mariah Bird needs a copy of her lab work for school. Her labs were all normal, she is negative for TB and has immunity to Hep B.  Please facilitate getting her these results for her school.  TY

## 2018-03-15 NOTE — Telephone Encounter (Signed)
Spoke to patient , patients mom is coming to pick copies

## 2018-03-18 LAB — MICROSCOPIC EXAMINATION: CASTS: NONE SEEN /LPF

## 2018-03-18 LAB — UA/M W/RFLX CULTURE, ROUTINE
Bilirubin, UA: NEGATIVE
Glucose, UA: NEGATIVE
Ketones, UA: NEGATIVE
Leukocytes, UA: NEGATIVE
Nitrite, UA: NEGATIVE
PH UA: 6.5 (ref 5.0–7.5)
PROTEIN UA: NEGATIVE
Specific Gravity, UA: 1.024 (ref 1.005–1.030)
UUROB: 0.2 mg/dL (ref 0.2–1.0)

## 2018-03-25 ENCOUNTER — Other Ambulatory Visit: Payer: Self-pay | Admitting: Nurse Practitioner

## 2018-04-08 ENCOUNTER — Encounter: Payer: Self-pay | Admitting: Adult Health

## 2018-04-08 ENCOUNTER — Ambulatory Visit: Payer: No Typology Code available for payment source | Admitting: Adult Health

## 2018-04-08 VITALS — BP 118/80 | HR 82 | Resp 16 | Ht 61.0 in | Wt 108.2 lb

## 2018-08-25 ENCOUNTER — Encounter: Payer: Self-pay | Admitting: Nurse Practitioner

## 2018-08-25 ENCOUNTER — Ambulatory Visit: Payer: Self-pay | Admitting: Nurse Practitioner

## 2018-08-25 VITALS — BP 106/69 | HR 77 | Temp 99.0°F | Resp 16 | Ht 61.0 in | Wt 101.2 lb

## 2018-08-25 DIAGNOSIS — J069 Acute upper respiratory infection, unspecified: Secondary | ICD-10-CM

## 2018-08-25 DIAGNOSIS — H10023 Other mucopurulent conjunctivitis, bilateral: Secondary | ICD-10-CM

## 2018-08-25 MED ORDER — ERYTHROMYCIN 5 MG/GM OP OINT: 1.0000 "application " | TOPICAL_OINTMENT | Freq: Four times a day (QID) | OPHTHALMIC | 1 refills | Status: DC

## 2018-08-25 MED ORDER — METHYLPREDNISOLONE 4 MG PO TBPK: ORAL_TABLET | ORAL | 0 refills | Status: DC

## 2018-08-25 MED ORDER — SULFAMETHOXAZOLE-TRIMETHOPRIM 800-160 MG PO TABS: 1.0000 | ORAL_TABLET | Freq: Two times a day (BID) | ORAL | 0 refills | Status: DC

## 2018-08-25 NOTE — Progress Notes (Signed)
[facility], [place] [address]  Internal MEDICINE  Office Visit Note  Patient Name: Mariah Bird  [id number]  [id number]  Date of Service: 08/29/2018   Pt is here for a sick visit.  Chief Complaint  Patient presents with  . Eye Problem    possible pink eye, has been going on since November its been on and off, pt has been around someone sick. is currently using eye drops. startd in left eye now switched to right eye on new years day, crusted eye, severly dry, hard to open feels liek its stuck under the eyelid, feels like something is always in it when its not. itching, blurred vision     The patient was seen by urgent care a few weeks ago. Was put on oral and optic eye drops. Was also given oral steroids. Was evaluated for scratched cornea and this was negative. Had sore throat and post nasal drip at the time. Was negative for strep. States that she got better with treatment, but then got worse almost right away. Today, she states that her throat is sore again. Eye itching and irritation started I left eye and is now in right eye as well. Worse when she first wakes up in the mornings. Eyes are crusted shut. Has green and purulent drainage from both eyes. She denies fever or chills. States that she has not been exposed to pink eye as far as she knows.   Eye Problem   Both eyes are affected.This is a chronic problem. The current episode started more than 1 month ago. The problem occurs constantly. The problem has been waxing and waning. There is no known exposure to pink eye. She does not wear contacts. Associated symptoms include an eye discharge, eye redness and itching. She has tried eye drops (antibiotic eye drops and oral antibiotics ) for the symptoms. The treatment provided mild (gets better than comes back .) relief.        Current Medication:  Outpatient Encounter Medications as of 08/25/2018  Medication Sig Note  . cyclobenzaprine  (FLEXERIL) 10 MG tablet  02/02/2017: PRN  . EPINEPHrine (EPIPEN 2-PAK) 0.3 mg/0.3 mL IJ SOAJ injection Inject into the muscle.   . LOW-OGESTREL 0.3-30 MG-MCG tablet TK UTD 03/23/2016: Received from: External Pharmacy  . norethindrone-ethinyl estradiol 1/35 (CYCLAFEM 1/35) tablet    . Vitamin D, Ergocalciferol, (DRISDOL) 50000 units CAPS capsule TK ONE C PO ONCE WEEKLY FOR VITAMIN-D DEFICIENCY 03/23/2016: Received from: External Pharmacy  . erythromycin ophthalmic ointment Place 1 application into both eyes 4 (four) times daily.   . methylPREDNISolone (MEDROL) 4 MG TBPK tablet Take by mouth as directed for 6 days   . potassium citrate (UROCIT-K) 10 MEQ (1080 MG) SR tablet Take 1 tablet (10 mEq total) by mouth 3 (three) times daily with meals. (Patient not taking: Reported on 03/07/2018)   . sulfamethoxazole-trimethoprim (BACTRIM DS,SEPTRA DS) 800-160 MG tablet Take 1 tablet by mouth 2 (two) times daily.    No facility-administered encounter medications on file as of 08/25/2018.       Medical History: Past Medical History:  Diagnosis Date  . Nephrolithiasis      Today's Vitals   08/25/18 1406  BP: 106/69  Pulse: 77  Resp: 16  Temp: 99 F (37.2 C)  SpO2: 94%  Weight: 101 lb 3.2 oz (45.9 kg)  Height: 5\' 1"  (1.549 m)    Review of Systems  Constitutional: Positive for fatigue. Negative for chills.  HENT: Positive for  congestion, ear pain, postnasal drip, rhinorrhea and sinus pain. Negative for sore throat and voice change.   Eyes: Positive for discharge, redness and itching.  Respiratory: Positive for cough. Negative for wheezing.   Cardiovascular: Negative for palpitations.  Gastrointestinal: Negative.   Musculoskeletal: Negative.   Skin: Negative.   Allergic/Immunologic: Positive for environmental allergies.  Neurological: Positive for headaches.  Hematological: Positive for adenopathy.  Psychiatric/Behavioral: Negative.     Physical Exam Vitals signs and nursing note reviewed.   Constitutional:      General: She is not in acute distress.    Appearance: Normal appearance. She is well-developed. She is not diaphoretic.  HENT:     Head: Normocephalic and atraumatic.     Nose: Congestion present.     Right Sinus: Frontal sinus tenderness present.     Left Sinus: Frontal sinus tenderness present.     Mouth/Throat:     Pharynx: No oropharyngeal exudate.     Comments: Post nasal drip is evident.  Eyes:     General:        Right eye: Discharge present.        Left eye: Discharge present.    Pupils: Pupils are equal, round, and reactive to light.     Comments: Excessive redness to bilateral sclera and conjunctiva. Moderate edema noted in lower lid of the right eye, slightly less edema in the lower left eye lid.   Neck:     Musculoskeletal: Normal range of motion and neck supple.     Thyroid: No thyromegaly.     Vascular: No JVD.     Trachea: No tracheal deviation.  Cardiovascular:     Rate and Rhythm: Normal rate and regular rhythm.     Heart sounds: Normal heart sounds. No murmur. No friction rub. No gallop.   Pulmonary:     Effort: Pulmonary effort is normal. No respiratory distress.     Breath sounds: Normal breath sounds. No wheezing or rales.  Chest:     Chest wall: No tenderness.  Abdominal:     General: Bowel sounds are normal.     Palpations: Abdomen is soft.  Musculoskeletal: Normal range of motion.  Lymphadenopathy:     Cervical: Cervical adenopathy present.  Skin:    General: Skin is warm and dry.  Neurological:     Mental Status: She is alert and oriented to person, place, and time.     Cranial Nerves: No cranial nerve deficit.  Psychiatric:        Behavior: Behavior normal.        Thought Content: Thought content normal.        Judgment: Judgment normal.    Assessment/Plan: 1. Acute upper respiratory infection Start bactrim DS bid for 7 days. Add medrol dose pack. Take as directed for 6 days. Consider otc medication to improve generally  symptoms - methylPREDNISolone (MEDROL) 4 MG TBPK tablet; Take by mouth as directed for 6 days  Dispense: 21 tablet; Refill: 0 - sulfamethoxazole-trimethoprim (BACTRIM DS,SEPTRA DS) 800-160 MG tablet; Take 1 tablet by mouth 2 (two) times daily.  Dispense: 14 tablet; Refill: 0  2. Other mucopurulent conjunctivitis of both eyes Add erythromycin eye ointment, putting 1cm ribbon on the inside of both bilateral lower lids four times daily while awake. Refer to her eye doctor if no improvement.  - erythromycin ophthalmic ointment; Place 1 application into both eyes 4 (four) times daily.  Dispense: 3.5 g; Refill: 1  General Counseling: Aleshka verbalizes understanding of the findings of  todays visit and agrees with plan of treatment. I have discussed any further diagnostic evaluation that may be needed or ordered today. We also reviewed her medications today. she has been encouraged to call the office with any questions or concerns that should arise related to todays visit.    Counseling:  This patient was seen by [place] with Dr Lavera Guise as a part of collaborative care agreement  Meds ordered this encounter  Medications  . methylPREDNISolone (MEDROL) 4 MG TBPK tablet    Sig: Take by mouth as directed for 6 days    Dispense:  21 tablet    Refill:  0    Order Specific Question:   Supervising Provider    Answer:   Lavera Guise [place]  . sulfamethoxazole-trimethoprim (BACTRIM DS,SEPTRA DS) 800-160 MG tablet    Sig: Take 1 tablet by mouth 2 (two) times daily.    Dispense:  14 tablet    Refill:  0    Order Specific Question:   Supervising Provider    Answer:   Lavera Guise [[id number]]  . erythromycin ophthalmic ointment    Sig: Place 1 application into both eyes 4 (four) times daily.    Dispense:  3.5 g    Refill:  1    Order Specific Question:   Supervising Provider    Answer:   Lavera Guise [[id number]]    Time spent: 25 Minutes

## 2018-08-29 DIAGNOSIS — H109 Unspecified conjunctivitis: Secondary | ICD-10-CM | POA: Insufficient documentation

## 2018-08-29 DIAGNOSIS — J069 Acute upper respiratory infection, unspecified: Secondary | ICD-10-CM | POA: Insufficient documentation

## 2018-09-30 ENCOUNTER — Encounter: Payer: Self-pay | Admitting: Adult Health

## 2018-09-30 ENCOUNTER — Ambulatory Visit: Payer: Self-pay | Admitting: Adult Health

## 2018-09-30 VITALS — BP 130/80 | HR 62 | Temp 98.4°F | Resp 16 | Ht 61.0 in | Wt 99.8 lb

## 2018-09-30 DIAGNOSIS — R591 Generalized enlarged lymph nodes: Secondary | ICD-10-CM

## 2018-09-30 DIAGNOSIS — L0291 Cutaneous abscess, unspecified: Secondary | ICD-10-CM

## 2018-09-30 DIAGNOSIS — M542 Cervicalgia: Secondary | ICD-10-CM

## 2018-09-30 MED ORDER — DOXYCYCLINE MONOHYDRATE 100 MG PO CAPS: 100.0000 mg | ORAL_CAPSULE | Freq: Two times a day (BID) | ORAL | 0 refills | Status: DC

## 2018-09-30 MED ORDER — DOXYCYCLINE HYCLATE 100 MG PO TABS: 100.0000 mg | ORAL_TABLET | Freq: Two times a day (BID) | ORAL | 0 refills | Status: DC

## 2018-09-30 NOTE — Progress Notes (Signed)
[facility] [place], [place] [address]  Internal MEDICINE  Office Visit Note  Patient Name: Mariah Bird  [id number]  [id number]  Date of Service: 12/19/2018  Chief Complaint  Patient presents with  . Neck Pain  . Pain    in left side of head feels like burning sensation     HPI Pt is here for a sick visit. Pt reports about 3 days of a burning sensation to the left side of her neck with some swelling.  It feels warm but does not appear red.  She denies any fever or chills at this time. The pain does move down her neck and up her head behind her ear.      Current Medication:  Outpatient Encounter Medications as of 09/30/2018  Medication Sig Note  . cyclobenzaprine (FLEXERIL) 10 MG tablet  02/02/2017: PRN  . EPINEPHrine (EPIPEN 2-PAK) 0.3 mg/0.3 mL IJ SOAJ injection Inject into the muscle.   . erythromycin ophthalmic ointment Place 1 application into both eyes 4 (four) times daily.   . methylPREDNISolone (MEDROL) 4 MG TBPK tablet Take by mouth as directed for 6 days   . norethindrone-ethinyl estradiol 1/35 (CYCLAFEM 1/35) tablet    . potassium citrate (UROCIT-K) 10 MEQ (1080 MG) SR tablet Take 1 tablet (10 mEq total) by mouth 3 (three) times daily with meals.   Marland Kitchen sulfamethoxazole-trimethoprim (BACTRIM DS,SEPTRA DS) 800-160 MG tablet Take 1 tablet by mouth 2 (two) times daily.   . Vitamin D, Ergocalciferol, (DRISDOL) 50000 units CAPS capsule TK ONE C PO ONCE WEEKLY FOR VITAMIN-D DEFICIENCY 03/23/2016: Received from: External Pharmacy  . [DISCONTINUED] LOW-OGESTREL 0.3-30 MG-MCG tablet TK UTD 11/28/2018: patient asked to change medication.  Marland Kitchen doxycycline (MONODOX) 100 MG capsule Take 1 capsule (100 mg total) by mouth 2 (two) times daily.   Marland Kitchen doxycycline (VIBRA-TABS) 100 MG tablet Take 1 tablet (100 mg total) by mouth 2 (two) times daily.    No facility-administered encounter medications on file as of 09/30/2018.       Medical History: Past Medical  History:  Diagnosis Date  . Nephrolithiasis      Vital Signs: BP 130/80   Pulse 62   Temp 98.4 F (36.9 C)   Resp 16   Ht 5\' 1"  (1.549 m)   Wt 99 lb 12.8 oz (45.3 kg)   SpO2 100%   BMI 18.86 kg/m    Review of Systems  Constitutional: Negative for chills, fatigue and unexpected weight change.  HENT: Negative for congestion, rhinorrhea, sneezing and sore throat.   Eyes: Negative for photophobia, pain and redness.  Respiratory: Negative for cough, chest tightness and shortness of breath.   Cardiovascular: Negative for chest pain and palpitations.  Gastrointestinal: Negative for abdominal pain, constipation, diarrhea, nausea and vomiting.  Endocrine: Negative.   Genitourinary: Negative for dysuria and frequency.  Musculoskeletal: Positive for neck pain. Negative for arthralgias, back pain and joint swelling.  Skin: Negative for rash.       Small 1x2 cm subcut abscess of left posterior scalp.  Allergic/Immunologic: Negative.   Neurological: Negative for tremors and numbness.  Hematological: Negative for adenopathy. Does not bruise/bleed easily.  Psychiatric/Behavioral: Negative for behavioral problems and sleep disturbance. The patient is not nervous/anxious.     Physical Exam Vitals signs and nursing note reviewed.  Constitutional:      General: She is not in acute distress.    Appearance: She is well-developed. She is not diaphoretic.  HENT:     Head: Normocephalic  and atraumatic.     Mouth/Throat:     Pharynx: No oropharyngeal exudate.  Eyes:     Pupils: Pupils are equal, round, and reactive to light.  Neck:     Musculoskeletal: Normal range of motion and neck supple.     Thyroid: No thyromegaly.     Vascular: No JVD.     Trachea: No tracheal deviation.  Cardiovascular:     Rate and Rhythm: Normal rate and regular rhythm.     Heart sounds: Normal heart sounds. No murmur. No friction rub. No gallop.   Pulmonary:     Effort: Pulmonary effort is normal. No  respiratory distress.     Breath sounds: Normal breath sounds. No wheezing or rales.  Chest:     Chest wall: No tenderness.  Abdominal:     Palpations: Abdomen is soft.     Tenderness: There is no abdominal tenderness. There is no guarding.  Musculoskeletal: Normal range of motion.  Lymphadenopathy:     Cervical: No cervical adenopathy.  Skin:    General: Skin is warm and dry.  Neurological:     Mental Status: She is alert and oriented to person, place, and time.     Cranial Nerves: No cranial nerve deficit.  Psychiatric:        Behavior: Behavior normal.        Thought Content: Thought content normal.        Judgment: Judgment normal.    Assessment/Plan: 1. Abscess Advised patient to take entire course of antibiotics as prescribed with food. Pt should return to clinic in 7-10 days if symptoms fail to improve or new symptoms develop.  - doxycycline (VIBRA-TABS) 100 MG tablet; Take 1 tablet (100 mg total) by mouth 2 (two) times daily.  Dispense: 28 tablet; Refill: 0  2. Lymphadenopathy Present on the affected side, continue to monitor.   3. Neck pain Likely from this abscess like area.   General Counseling: amir glaus understanding of the findings of todays visit and agrees with plan of treatment. I have discussed any further diagnostic evaluation that may be needed or ordered today. We also reviewed her medications today. she has been encouraged to call the office with any questions or concerns that should arise related to todays visit.   No orders of the defined types were placed in this encounter.   Meds ordered this encounter  Medications  . doxycycline (VIBRA-TABS) 100 MG tablet    Sig: Take 1 tablet (100 mg total) by mouth 2 (two) times daily.    Dispense:  28 tablet    Refill:  0  . doxycycline (MONODOX) 100 MG capsule    Sig: Take 1 capsule (100 mg total) by mouth 2 (two) times daily.    Dispense:  28 capsule    Refill:  0    Time spent: 25  Minutes  This patient was seen by Orson Gear AGNP-C in Collaboration with Dr Lavera Guise as a part of collaborative care agreement.  Kendell Bane AGNP-C Internal Medicine

## 2018-09-30 NOTE — Patient Instructions (Signed)
Skin Abscess  A skin abscess is an infected area on or under your skin that contains a collection of pus and other material. An abscess may also be called a furuncle, carbuncle, or boil. An abscess can occur in or on almost any part of your body. Some abscesses break open (rupture) on their own. Most continue to get worse unless they are treated. The infection can spread deeper into the body and eventually into your blood, which can make you feel ill. Treatment usually involves draining the abscess. What are the causes? An abscess occurs when germs, like bacteria, pass through your skin and cause an infection. This may be caused by:  A scrape or cut on your skin.  A puncture wound through your skin, including a needle injection or insect bite.  Blocked oil or sweat glands.  Blocked and infected hair follicles.  A cyst that forms beneath your skin (sebaceous cyst) and becomes infected. What increases the risk? This condition is more likely to develop in people who:  Have a weak body defense system (immune system).  Have diabetes.  Have dry and irritated skin.  Get frequent injections or use illegal IV drugs.  Have a foreign body in a wound, such as a splinter.  Have problems with their lymph system or veins. What are the signs or symptoms? Symptoms of this condition include:  A painful, firm bump under the skin.  A bump with pus at the top. This may break through the skin and drain. Other symptoms include:  Redness surrounding the abscess site.  Warmth.  Swelling of the lymph nodes (glands) near the abscess.  Tenderness.  A sore on the skin. How is this diagnosed? This condition may be diagnosed based on:  A physical exam.  Your medical history.  A sample of pus. This may be used to find out what is causing the infection.  Blood tests.  Imaging tests, such as an ultrasound, CT scan, or MRI. How is this treated? A small abscess that drains on its own may not  need treatment. Treatment for larger abscesses may include:  Moist heat or heat pack applied to the area several times a day.  A procedure to drain the abscess (incision and drainage).  Antibiotic medicines. For a severe abscess, you may first get antibiotics through an IV and then change to antibiotics by mouth. Follow these instructions at home: Medicines   Take over-the-counter and prescription medicines only as told by your health care provider.  If you were prescribed an antibiotic medicine, take it as told by your health care provider. Do not stop taking the antibiotic even if you start to feel better. Abscess care   If you have an abscess that has not drained, apply heat to the affected area. Use the heat source that your health care provider recommends, such as a moist heat pack or a heating pad. ? Place a towel between your skin and the heat source. ? Leave the heat on for 20-30 minutes. ? Remove the heat if your skin turns bright red. This is especially important if you are unable to feel pain, heat, or cold. You may have a greater risk of getting burned.  Follow instructions from your health care provider about how to take care of your abscess. Make sure you: ? Cover the abscess with a bandage (dressing). ? Change your dressing or gauze as told by your health care provider. ? Wash your hands with soap and water before you change the   dressing or gauze. If soap and water are not available, use hand sanitizer.  Check your abscess every day for signs of a worsening infection. Check for: ? More redness, swelling, or pain. ? More fluid or blood. ? Warmth. ? More pus or a bad smell. General instructions  To avoid spreading the infection: ? Do not share personal care items, towels, or hot tubs with others. ? Avoid making skin contact with other people.  Keep all follow-up visits as told by your health care provider. This is important. Contact a health care provider if you  have:  More redness, swelling, or pain around your abscess.  More fluid or blood coming from your abscess.  Warm skin around your abscess.  More pus or a bad smell coming from your abscess.  A fever.  Muscle aches.  Chills or a general ill feeling. Get help right away if you:  Have severe pain.  See red streaks on your skin spreading away from the abscess. Summary  A skin abscess is an infected area on or under your skin that contains a collection of pus and other material.  A small abscess that drains on its own may not need treatment.  Treatment for larger abscesses may include having a procedure to drain the abscess and taking an antibiotic. This information is not intended to replace advice given to you by your health care provider. Make sure you discuss any questions you have with your health care provider. Document Released: 05/13/2005 Document Revised: 09/16/2017 Document Reviewed: 09/16/2017 Elsevier Interactive Patient Education  2019 [employer]  

## 2018-11-15 NOTE — Progress Notes (Signed)
No show

## 2018-11-28 ENCOUNTER — Other Ambulatory Visit: Payer: Self-pay | Admitting: Nurse Practitioner

## 2018-11-28 ENCOUNTER — Other Ambulatory Visit: Payer: Self-pay

## 2018-11-28 DIAGNOSIS — Z30011 Encounter for initial prescription of contraceptive pills: Secondary | ICD-10-CM

## 2018-11-28 MED ORDER — NORETHIN ACE-ETH ESTRAD-FE 1-20 MG-MCG PO TABS: 1.0000 | ORAL_TABLET | Freq: Every day | ORAL | 11 refills | Status: DC

## 2018-11-28 NOTE — Progress Notes (Signed)
Changed ocp to junel FE 1.5/30mg  daily and sent to her pharmacy.

## 2019-12-19 ENCOUNTER — Telehealth: Payer: Self-pay

## 2019-12-19 NOTE — Telephone Encounter (Signed)
Confirmed appointment on 12/21/2019 and screened for covid. klh 

## 2019-12-21 ENCOUNTER — Encounter: Payer: Self-pay | Admitting: Adult Health

## 2019-12-21 ENCOUNTER — Ambulatory Visit (INDEPENDENT_AMBULATORY_CARE_PROVIDER_SITE_OTHER): Payer: No Typology Code available for payment source | Admitting: Adult Health

## 2019-12-21 ENCOUNTER — Other Ambulatory Visit: Payer: Self-pay

## 2019-12-21 VITALS — BP 121/75 | HR 90 | Temp 97.8°F | Resp 16 | Wt 100.8 lb

## 2019-12-21 DIAGNOSIS — B373 Candidiasis of vulva and vagina: Secondary | ICD-10-CM

## 2019-12-21 DIAGNOSIS — B3731 Acute candidiasis of vulva and vagina: Secondary | ICD-10-CM

## 2019-12-21 DIAGNOSIS — Z113 Encounter for screening for infections with a predominantly sexual mode of transmission: Secondary | ICD-10-CM

## 2019-12-21 DIAGNOSIS — Z3200 Encounter for pregnancy test, result unknown: Secondary | ICD-10-CM

## 2019-12-21 DIAGNOSIS — Z124 Encounter for screening for malignant neoplasm of cervix: Secondary | ICD-10-CM

## 2019-12-21 DIAGNOSIS — N760 Acute vaginitis: Secondary | ICD-10-CM

## 2019-12-21 LAB — POCT URINE PREGNANCY: Preg Test, Ur: NEGATIVE

## 2019-12-21 MED ORDER — METRONIDAZOLE 500 MG PO TABS: 500.0000 mg | ORAL_TABLET | Freq: Two times a day (BID) | ORAL | 0 refills | Status: DC

## 2019-12-21 MED ORDER — FLUCONAZOLE 150 MG PO TABS: 150.0000 mg | ORAL_TABLET | ORAL | 0 refills | Status: DC

## 2019-12-21 NOTE — Progress Notes (Signed)
[facility] [place], [place] [address]  Internal MEDICINE  Office Visit Note  Patient Name: Mariah Bird  [id number]  [id number]  Date of Service: 12/21/2019  Chief Complaint  Patient presents with  . Acute Visit    yeast infection     HPI Pt is here for a sick visit. Pt reports she had sex with a new partner on 12/09/19, and a few days later she noticed vaginal itching, irritation and discharge.  She described a milky thick discharge.  She used OTC monistat.  She completed it yesterday. Her last Menstrual period ended on April 14th.    Current Medication:  Outpatient Encounter Medications as of 12/21/2019  Medication Sig Note  . cyclobenzaprine (FLEXERIL) 10 MG tablet  02/02/2017: PRN  . [DISCONTINUED] doxycycline (MONODOX) 100 MG capsule Take 1 capsule (100 mg total) by mouth 2 (two) times daily.   . [DISCONTINUED] doxycycline (VIBRA-TABS) 100 MG tablet Take 1 tablet (100 mg total) by mouth 2 (two) times daily.   . [DISCONTINUED] EPINEPHrine (EPIPEN 2-PAK) 0.3 mg/0.3 mL IJ SOAJ injection Inject into the muscle.   . [DISCONTINUED] erythromycin ophthalmic ointment Place 1 application into both eyes 4 (four) times daily.   . [DISCONTINUED] methylPREDNISolone (MEDROL) 4 MG TBPK tablet Take by mouth as directed for 6 days   . [DISCONTINUED] norethindrone-ethinyl estradiol (JUNEL FE 1/20) 1-20 MG-MCG tablet Take 1 tablet by mouth daily.   . [DISCONTINUED] norethindrone-ethinyl estradiol 1/35 (CYCLAFEM 1/35) tablet    . [DISCONTINUED] potassium citrate (UROCIT-K) 10 MEQ (1080 MG) SR tablet Take 1 tablet (10 mEq total) by mouth 3 (three) times daily with meals.   . [DISCONTINUED] sulfamethoxazole-trimethoprim (BACTRIM DS,SEPTRA DS) 800-160 MG tablet Take 1 tablet by mouth 2 (two) times daily.   . [DISCONTINUED] Vitamin D, Ergocalciferol, (DRISDOL) 50000 units CAPS capsule TK ONE C PO ONCE WEEKLY FOR VITAMIN-D DEFICIENCY 03/23/2016: Received from: External Pharmacy    No facility-administered encounter medications on file as of 12/21/2019.      Medical History: Past Medical History:  Diagnosis Date  . Nephrolithiasis      Vital Signs: BP 121/75   Pulse 90   Temp 97.8 F (36.6 C)   Resp 16   Wt 100 lb 12.8 oz (45.7 kg)   SpO2 98%   BMI 19.05 kg/m    Review of Systems  Constitutional: Negative for chills, fatigue and unexpected weight change.  HENT: Negative for congestion, rhinorrhea, sneezing and sore throat.   Eyes: Negative for photophobia, pain and redness.  Respiratory: Negative for cough, chest tightness and shortness of breath.   Cardiovascular: Negative for chest pain and palpitations.  Gastrointestinal: Negative for abdominal pain, constipation, diarrhea, nausea and vomiting.  Endocrine: Negative.   Genitourinary: Negative for dysuria and frequency.  Musculoskeletal: Negative for arthralgias, back pain, joint swelling and neck pain.  Skin: Negative for rash.  Allergic/Immunologic: Negative.   Neurological: Negative for tremors and numbness.  Hematological: Negative for adenopathy. Does not bruise/bleed easily.  Psychiatric/Behavioral: Negative for behavioral problems and sleep disturbance. The patient is not nervous/anxious.     Physical Exam Vitals and nursing note reviewed.  Constitutional:      General: She is not in acute distress.    Appearance: She is well-developed. She is not diaphoretic.  HENT:     Head: Normocephalic and atraumatic.     Mouth/Throat:     Pharynx: No oropharyngeal exudate.  Eyes:     Pupils: Pupils are equal, round, and reactive to light.  Neck:     Thyroid: No thyromegaly.     Vascular: No JVD.     Trachea: No tracheal deviation.  Cardiovascular:     Rate and Rhythm: Normal rate and regular rhythm.     Heart sounds: Normal heart sounds. No murmur. No friction rub. No gallop.   Pulmonary:     Effort: Pulmonary effort is normal. No respiratory distress.     Breath sounds: Normal breath  sounds. No wheezing or rales.  Chest:     Chest wall: No tenderness.  Abdominal:     Palpations: Abdomen is soft.     Tenderness: There is no abdominal tenderness. There is no guarding.  Musculoskeletal:        General: Normal range of motion.     Cervical back: Normal range of motion and neck supple.  Lymphadenopathy:     Cervical: No cervical adenopathy.  Skin:    General: Skin is warm and dry.  Neurological:     Mental Status: She is alert and oriented to person, place, and time.     Cranial Nerves: No cranial nerve deficit.  Psychiatric:        Behavior: Behavior normal.        Thought Content: Thought content normal.        Judgment: Judgment normal.     Assessment/Plan: 1. Encounter for screening examination for sexually transmitted disease Pt requested screening for Secually transmitted infections at this time.  - HIV antibody (with reflex) - RPR  2. Vaginal candida Use flagyl and difulcan as discussed.  - fluconazole (DIFLUCAN) 150 MG tablet; Take 1 tablet (150 mg total) by mouth every 3 (three) days.  Dispense: 3 tablet; Refill: 0 - metroNIDAZOLE (FLAGYL) 500 MG tablet; Take 1 tablet (500 mg total) by mouth 2 (two) times daily.  Dispense: 14 tablet; Refill: 0  3. Acute vaginitis Sent to lab. - NuSwab Vaginitis Plus (VG+)  4. Routine cervical smear Sent to lab. - IGP, Aptima HPV  5. Encounter for pregnancy test, result unknown Negative Urine Pregnancy. - POCT urine pregnancy  General Counseling: Sivani verbalizes understanding of the findings of todays visit and agrees with plan of treatment. I have discussed any further diagnostic evaluation that may be needed or ordered today. We also reviewed her medications today. she has been encouraged to call the office with any questions or concerns that should arise related to todays visit.   No orders of the defined types were placed in this encounter.   No orders of the defined types were placed in this  encounter.   Time spent: 30 Minutes  This patient was seen by Orson Gear AGNP-C in Collaboration with Dr Lavera Guise as a part of collaborative care agreement.  Kendell Bane AGNP-C Internal Medicine

## 2019-12-24 LAB — NUSWAB VAGINITIS PLUS (VG+)
Candida albicans, NAA: NEGATIVE
Candida glabrata, NAA: NEGATIVE
Chlamydia trachomatis, NAA: NEGATIVE
Neisseria gonorrhoeae, NAA: NEGATIVE
Trich vag by NAA: NEGATIVE

## 2019-12-25 LAB — IGP, APTIMA HPV: HPV Aptima: NEGATIVE

## 2020-01-03 ENCOUNTER — Telehealth: Payer: Self-pay

## 2020-01-03 NOTE — Telephone Encounter (Signed)
-----   Message from Kendell Bane, NP sent at 01/02/2020  8:43 AM EDT ----- Please let her know the specimen from her PAP was insufficient, and we will need to do it again in the future.  She can make an appt in the next few weeks.

## 2020-01-03 NOTE — Telephone Encounter (Signed)
Pt was notified and appt was made.

## 2020-01-23 ENCOUNTER — Encounter: Payer: Self-pay | Admitting: Adult Health

## 2020-01-23 ENCOUNTER — Other Ambulatory Visit: Payer: Self-pay

## 2020-01-23 ENCOUNTER — Ambulatory Visit: Payer: No Typology Code available for payment source | Admitting: Adult Health

## 2020-01-23 VITALS — BP 108/76 | HR 85 | Temp 97.6°F | Resp 16 | Ht 61.0 in | Wt 98.0 lb

## 2020-01-23 DIAGNOSIS — Z124 Encounter for screening for malignant neoplasm of cervix: Secondary | ICD-10-CM

## 2020-01-23 NOTE — Progress Notes (Signed)
[facility] [place], [place] [address]  Internal MEDICINE  Office Visit Note  Patient Name: Mariah Bird  [id number]  [id number]  Date of Service: 02/29/2020   Chief Complaint  Patient presents with  . Follow-up    repeat pap     HPI Pt is here for a pap smear.  Her routine PAP last month was inadequate for eval. Repeated today.    Current Medication: Outpatient Encounter Medications as of 01/23/2020  Medication Sig Note  . cyclobenzaprine (FLEXERIL) 10 MG tablet  02/02/2017: PRN  . fluconazole (DIFLUCAN) 150 MG tablet Take 1 tablet (150 mg total) by mouth every 3 (three) days.   . metroNIDAZOLE (FLAGYL) 500 MG tablet Take 1 tablet (500 mg total) by mouth 2 (two) times daily.    No facility-administered encounter medications on file as of 01/23/2020.    Surgical History: Past Surgical History:  Procedure Laterality Date  . KNEE ARTHROSCOPY    . NASAL SINUS SURGERY  12/18/2016    Medical History: Past Medical History:  Diagnosis Date  . Nephrolithiasis     Family History: Family History  Problem Relation Age of Onset  . Kidney Stones Father   . Kidney Stones Paternal Grandmother   . Kidney disease Maternal Uncle   . Bladder Cancer Neg Hx   . Kidney cancer Neg Hx   . Prostate cancer Neg Hx     Social History   Socioeconomic History  . Marital status: Single    Spouse name: Not on file  . Number of children: Not on file  . Years of education: Not on file  . Highest education level: Not on file  Occupational History  . Not on file  Tobacco Use  . Smoking status: Never Smoker  . Smokeless tobacco: Never Used  Vaping Use  . Vaping Use: Never used  Substance and Sexual Activity  . Alcohol use: Yes  . Drug use: No  . Sexual activity: Not on file  Other Topics Concern  . Not on file  Social History Narrative  . Not on file   Social Determinants of Health   Financial Resource Strain:   . Difficulty of Paying Living  Expenses:   Food Insecurity:   . Worried About Charity fundraiser in the Last Year:   . Arboriculturist in the Last Year:   Transportation Needs:   . Film/video editor (Medical):   Marland Kitchen Lack of Transportation (Non-Medical):   Physical Activity:   . Days of Exercise per Week:   . Minutes of Exercise per Session:   Stress:   . Feeling of Stress :   Social Connections:   . Frequency of Communication with Friends and Family:   . Frequency of Social Gatherings with Friends and Family:   . Attends Religious Services:   . Active Member of Clubs or Organizations:   . Attends Archivist Meetings:   Marland Kitchen Marital Status:   Intimate Partner Violence:   . Fear of Current or Ex-Partner:   . Emotionally Abused:   Marland Kitchen Physically Abused:   . Sexually Abused:      Review of Systems  Constitutional: Negative for chills, fatigue and unexpected weight change.  HENT: Negative for congestion, rhinorrhea, sneezing and sore throat.   Eyes: Negative for photophobia, pain and redness.  Respiratory: Negative for cough, chest tightness and shortness of breath.   Cardiovascular: Negative for chest pain and palpitations.  Gastrointestinal: Negative for abdominal pain, constipation,  diarrhea, nausea and vomiting.  Endocrine: Negative.   Genitourinary: Negative for dysuria and frequency.  Musculoskeletal: Negative for arthralgias, back pain, joint swelling and neck pain.  Skin: Negative for rash.  Allergic/Immunologic: Negative.   Neurological: Negative for tremors and numbness.  Hematological: Negative for adenopathy. Does not bruise/bleed easily.  Psychiatric/Behavioral: Negative for behavioral problems and sleep disturbance. The patient is not nervous/anxious.      Vital signs: BP 108/76   Pulse 85   Temp 97.6 F (36.4 C)   Resp 16   Ht 5\' 1"  (1.549 m)   Wt 98 lb (44.5 kg)   SpO2 96%   BMI 18.52 kg/m    Physical Exam Vitals and nursing note reviewed. Exam conducted with a  chaperone present.  Constitutional:      General: She is not in acute distress.    Appearance: She is well-developed. She is not diaphoretic.  HENT:     Head: Normocephalic and atraumatic.     Mouth/Throat:     Pharynx: No oropharyngeal exudate.  Eyes:     Pupils: Pupils are equal, round, and reactive to light.  Neck:     Thyroid: No thyromegaly.     Vascular: No JVD.     Trachea: No tracheal deviation.  Cardiovascular:     Rate and Rhythm: Normal rate and regular rhythm.     Heart sounds: Normal heart sounds. No murmur. No friction rub. No gallop.   Pulmonary:     Effort: Pulmonary effort is normal. No respiratory distress.     Breath sounds: Normal breath sounds. No wheezing or rales.  Chest:     Chest wall: No tenderness.  Abdominal:     Palpations: Abdomen is soft.     Tenderness: There is no abdominal tenderness. There is no guarding.  Genitourinary:    Vagina: Normal.     Cervix: Normal.     Rectum: Normal.     Comments: Cheri Fowler, cma chaperoned.  Musculoskeletal:        General: Normal range of motion.     Cervical back: Normal range of motion and neck supple.  Lymphadenopathy:     Cervical: No cervical adenopathy.  Skin:    General: Skin is warm and dry.  Neurological:     Mental Status: She is alert and oriented to person, place, and time.     Cranial Nerves: No cranial nerve deficit.  Psychiatric:        Behavior: Behavior normal.        Thought Content: Thought content normal.        Judgment: Judgment normal.    Assessment/Plan: 1. Routine cervical smear Pap performed, sent to lab. - IGP, Aptima HPV  General Counseling: Mariah Bird verbalizes understanding of the findings of todays visit and agrees with plan of treatment. I have discussed any further diagnostic evaluation that may be needed or ordered today. We also reviewed her medications today. she has been encouraged to call the office with any questions or concerns that should arise related to todays  visit.    No orders of the defined types were placed in this encounter.   No orders of the defined types were placed in this encounter.   Time spent: 30 Minutes  This patient was seen by Orson Gear AGNP-C in Collaboration with Dr Lavera Guise as a part of collaborative care agreement  Kendell Bane AGNP-C Internal Medicine

## 2020-01-25 LAB — IGP, APTIMA HPV: HPV Aptima: NEGATIVE

## 2020-01-26 ENCOUNTER — Encounter: Payer: Self-pay | Admitting: Nurse Practitioner

## 2020-01-26 NOTE — Telephone Encounter (Signed)
Can we go ahead and do this for her. Mariah Bird has been seeing her, but I got the message.

## 2020-02-02 ENCOUNTER — Telehealth: Payer: Self-pay

## 2020-02-02 NOTE — Telephone Encounter (Signed)
Completed medical record request contacted patient that the records are ready for pick up at front desk.

## 2020-07-08 ENCOUNTER — Encounter: Payer: Self-pay | Admitting: Hospice and Palliative Medicine

## 2020-07-08 ENCOUNTER — Ambulatory Visit (INDEPENDENT_AMBULATORY_CARE_PROVIDER_SITE_OTHER): Payer: No Typology Code available for payment source | Admitting: Hospice and Palliative Medicine

## 2020-07-08 ENCOUNTER — Other Ambulatory Visit: Payer: Self-pay

## 2020-07-08 DIAGNOSIS — Z0001 Encounter for general adult medical examination with abnormal findings: Secondary | ICD-10-CM | POA: Diagnosis not present

## 2020-07-08 DIAGNOSIS — F411 Generalized anxiety disorder: Secondary | ICD-10-CM

## 2020-07-08 DIAGNOSIS — R300 Dysuria: Secondary | ICD-10-CM

## 2020-07-08 DIAGNOSIS — R5383 Other fatigue: Secondary | ICD-10-CM | POA: Diagnosis not present

## 2020-07-08 DIAGNOSIS — R87610 Atypical squamous cells of undetermined significance on cytologic smear of cervix (ASC-US): Secondary | ICD-10-CM

## 2020-07-08 DIAGNOSIS — F3289 Other specified depressive episodes: Secondary | ICD-10-CM

## 2020-07-08 MED ORDER — ESCITALOPRAM OXALATE 5 MG PO TABS: 5.0000 mg | ORAL_TABLET | Freq: Every day | ORAL | 0 refills | Status: DC

## 2020-07-08 MED ORDER — HYDROXYZINE PAMOATE 25 MG PO CAPS: 25.0000 mg | ORAL_CAPSULE | Freq: Every evening | ORAL | 0 refills | Status: DC | PRN

## 2020-07-08 NOTE — Progress Notes (Signed)
[facility] [place], [place] [address]  Internal MEDICINE  Office Visit Note  Patient Name: Mariah Bird  [id number]  [id number]  Date of Service: 07/13/2020  Chief Complaint  Patient presents with  . Annual Exam    needs labs, low energy, vitamin d, meds for depression and anxiety  . policy update form    received  . Depression  . Anxiety     HPI Pt is here for routine health maintenance examination She has been struggling with depression and anxiety--feels as though this is triggered by her work and being overwhelmed In the past has been able to well control her symptoms herself with meditation but feels as though it is out of control and this time--absorbs her families issues and finds it hard to find time to work through her own worries Mentions that she would like to have her labs checked as well as she feels tired most days and has a lack of energy--not sleeping well at night due to anxiety, averages about 4-5 hours of sleep per night, tosses and turns most nights, unable to calm her mind  Discussed her latest PAP results--ASC-US, HPV, was referred to GYN--was not pleased with exam as her PAP was not repeated and they did not discuss ASC-US results--is trying to get in to see a new GYN, needs to call and schedule appt.   Current Medication: Outpatient Encounter Medications as of 07/08/2020  Medication Sig Note  . cyclobenzaprine (FLEXERIL) 10 MG tablet  02/02/2017: PRN  . escitalopram (LEXAPRO) 5 MG tablet Take 1 tablet (5 mg total) by mouth daily.   . hydrOXYzine (VISTARIL) 25 MG capsule Take 1 capsule (25 mg total) by mouth at bedtime as needed.   . [DISCONTINUED] fluconazole (DIFLUCAN) 150 MG tablet Take 1 tablet (150 mg total) by mouth every 3 (three) days.   . [DISCONTINUED] metroNIDAZOLE (FLAGYL) 500 MG tablet Take 1 tablet (500 mg total) by mouth 2 (two) times daily.    No facility-administered encounter medications on file as of  07/08/2020.    Surgical History: Past Surgical History:  Procedure Laterality Date  . KNEE ARTHROSCOPY    . NASAL SINUS SURGERY  12/18/2016    Medical History: Past Medical History:  Diagnosis Date  . Anxiety   . Depression   . Nephrolithiasis     Family History: Family History  Problem Relation Age of Onset  . Kidney Stones Father   . Kidney Stones Paternal Grandmother   . Kidney disease Maternal Uncle   . Bladder Cancer Neg Hx   . Kidney cancer Neg Hx   . Prostate cancer Neg Hx       Review of Systems  Constitutional: Positive for fatigue. Negative for chills and diaphoresis.  HENT: Negative for ear pain, postnasal drip and sinus pressure.   Eyes: Negative for photophobia, discharge, redness, itching and visual disturbance.  Respiratory: Negative for cough, shortness of breath and wheezing.   Cardiovascular: Negative for chest pain, palpitations and leg swelling.  Gastrointestinal: Negative for abdominal pain, constipation, diarrhea, nausea and vomiting.  Genitourinary: Negative for dysuria and flank pain.  Musculoskeletal: Negative for arthralgias, back pain, gait problem and neck pain.  Skin: Negative for color change.  Allergic/Immunologic: Negative for environmental allergies and food allergies.  Neurological: Negative for dizziness and headaches.  Hematological: Does not bruise/bleed easily.  Psychiatric/Behavioral: Positive for decreased concentration. Negative for agitation, behavioral problems (depression) and hallucinations. The patient is nervous/anxious.      Vital Signs:  BP 102/76   Pulse 88   Temp 97.7 F (36.5 C)   Resp 16   Ht 5\' 1"  (1.549 m)   Wt 96 lb 6.4 oz (43.7 kg)   SpO2 99%   BMI 18.21 kg/m    Physical Exam Constitutional:      Appearance: Normal appearance. She is normal weight.  HENT:     Right Ear: Tympanic membrane normal.     Left Ear: Tympanic membrane normal.     Nose: Nose normal.     Mouth/Throat:     Mouth: Mucous  membranes are moist.     Pharynx: Oropharynx is clear.  Eyes:     Pupils: Pupils are equal, round, and reactive to light.  Cardiovascular:     Rate and Rhythm: Normal rate and regular rhythm.     Pulses: Normal pulses.     Heart sounds: Normal heart sounds.  Pulmonary:     Effort: Pulmonary effort is normal.     Breath sounds: Normal breath sounds.  Chest:     Breasts:        Right: Normal.        Left: Normal.  Abdominal:     General: Abdomen is flat.     Palpations: Abdomen is soft.  Musculoskeletal:        General: Normal range of motion.     Cervical back: Normal range of motion.  Skin:    General: Skin is warm.  Neurological:     General: No focal deficit present.     Mental Status: She is alert and oriented to person, place, and time. Mental status is at baseline.  Psychiatric:        Mood and Affect: Mood normal.        Behavior: Behavior normal.        Thought Content: Thought content normal.      LABS: Recent Results (from the past 2160 hour(s))  UA/M w/rflx Culture, Routine     Status: Abnormal   Collection Time: 07/08/20 10:26 AM   Specimen: Urine   Urine  Result Value Ref Range   Specific Gravity, UA 1.026 1.005 - 1.030   pH, UA 6.5 5.0 - 7.5   Color, UA Yellow Yellow   Appearance Ur Turbid (A) Clear   Leukocytes,UA Negative Negative   Protein,UA Trace Negative/Trace   Glucose, UA Negative Negative   Ketones, UA Trace (A) Negative   RBC, UA Negative Negative   Bilirubin, UA Negative Negative   Urobilinogen, Ur 1.0 0.2 - 1.0 mg/dL   Nitrite, UA Negative Negative   Microscopic Examination Comment     Comment: Microscopic follows if indicated.   Microscopic Examination See below:     Comment: Microscopic was indicated and was performed.   Urinalysis Reflex Comment     Comment: This specimen will not reflex to a Urine Culture.  Microscopic Examination     Status: Abnormal   Collection Time: 07/08/20 10:26 AM   Urine  Result Value Ref Range   WBC,  UA 0-5 0 - 5 /hpf   RBC 0-2 0 - 2 /hpf   Epithelial Cells (non renal) 0-10 0 - 10 /hpf   Casts None seen None seen /lpf   Crystals Present (A) N/A   Crystal Type Calcium Oxalate N/A   Mucus, UA Present Not Estab.   Bacteria, UA None seen None seen/Few    Assessment/Plan: 1. Encounter for routine adult health examination with abnormal findings Well appearing 34 year old  AA female Will review routine annual labs and adjust plan of care as indicated Up to date on PHM - CBC w/Diff/Platelet - Comprehensive Metabolic Panel (CMET) - TSH + free T4 - Lipid Panel With LDL/HDL Ratio - Vitamin D (25 hydroxy) - B12  2. GAD (generalized anxiety disorder) Start lexapro for symptoms of anxiety and depression--will assess response to therapy and adjust as needed Vistaril as needed at night to help with insomnia symptoms - escitalopram (LEXAPRO) 5 MG tablet; Take 1 tablet (5 mg total) by mouth daily.  Dispense: 90 tablet; Refill: 0 - hydrOXYzine (VISTARIL) 25 MG capsule; Take 1 capsule (25 mg total) by mouth at bedtime as needed.  Dispense: 30 capsule; Refill: 0  3. Other depression Start lexapro for symptoms of anxiety and depression--will assess response to therapy and adjust as needed - escitalopram (LEXAPRO) 5 MG tablet; Take 1 tablet (5 mg total) by mouth daily.  Dispense: 90 tablet; Refill: 0  4. Fatigue, unspecified type Possible related to anxiety/depression/lack of sleep Will review labs and adjust plan of care as indicated - CBC w/Diff/Platelet - Comprehensive Metabolic Panel (CMET) - TSH + free T4 - Vitamin D (25 hydroxy) - B12  5. Atyp squam cell of undet signfc cyto smr crvx (ASC-US) Printed out results for her to take to GYN--strongly advised to call GYN and have appt scheduled for repeat PAP//possible treatment  6. Dysuria - UA/M w/rflx Culture, Routine - Microscopic Examination  General Counseling: Desiree verbalizes understanding of the findings of todays visit and  agrees with plan of treatment. I have discussed any further diagnostic evaluation that may be needed or ordered today. We also reviewed her medications today. she has been encouraged to call the office with any questions or concerns that should arise related to todays visit.  Orders Placed This Encounter  Procedures  . Microscopic Examination  . UA/M w/rflx Culture, Routine  . CBC w/Diff/Platelet  . Comprehensive Metabolic Panel (CMET)  . TSH + free T4  . Lipid Panel With LDL/HDL Ratio  . Vitamin D (25 hydroxy)  . B12    Meds ordered this encounter  Medications  . escitalopram (LEXAPRO) 5 MG tablet    Sig: Take 1 tablet (5 mg total) by mouth daily.    Dispense:  90 tablet    Refill:  0  . hydrOXYzine (VISTARIL) 25 MG capsule    Sig: Take 1 capsule (25 mg total) by mouth at bedtime as needed.    Dispense:  30 capsule    Refill:  0    Total time spent: 35 Minutes  Time spent includes review of chart, medications, test results, and follow up plan with the patient.   This patient was seen by Casey Burkitt AGNP-C Collaboration with Dr Lavera Guise as a part of collaborative care agreement   Tanna Furry. [facility] Internal Medicine

## 2020-07-09 LAB — UA/M W/RFLX CULTURE, ROUTINE
Bilirubin, UA: NEGATIVE
Glucose, UA: NEGATIVE
Leukocytes,UA: NEGATIVE
Nitrite, UA: NEGATIVE
RBC, UA: NEGATIVE
Specific Gravity, UA: 1.026 (ref 1.005–1.030)
Urobilinogen, Ur: 1 mg/dL (ref 0.2–1.0)
pH, UA: 6.5 (ref 5.0–7.5)

## 2020-07-09 LAB — MICROSCOPIC EXAMINATION
Bacteria, UA: NONE SEEN
Casts: NONE SEEN /lpf

## 2020-07-13 ENCOUNTER — Encounter: Payer: Self-pay | Admitting: Hospice and Palliative Medicine

## 2020-07-19 ENCOUNTER — Other Ambulatory Visit: Payer: Self-pay | Admitting: Hospice and Palliative Medicine

## 2020-07-19 LAB — CBC WITH DIFFERENTIAL/PLATELET
Basophils Absolute: 0 10*3/uL (ref 0.0–0.2)
Basos: 1 %
EOS (ABSOLUTE): 0.1 10*3/uL (ref 0.0–0.4)
Eos: 1 %
Hematocrit: 42.1 % (ref 34.0–46.6)
Hemoglobin: 13.9 g/dL (ref 11.1–15.9)
Immature Grans (Abs): 0 10*3/uL (ref 0.0–0.1)
Immature Granulocytes: 0 %
Lymphocytes Absolute: 2.4 10*3/uL (ref 0.7–3.1)
Lymphs: 48 %
MCH: 31.1 pg (ref 26.6–33.0)
MCHC: 33 g/dL (ref 31.5–35.7)
MCV: 94 fL (ref 79–97)
Monocytes Absolute: 0.6 10*3/uL (ref 0.1–0.9)
Monocytes: 12 %
Neutrophils Absolute: 1.9 10*3/uL (ref 1.4–7.0)
Neutrophils: 38 %
Platelets: 262 10*3/uL (ref 150–450)
RBC: 4.47 x10E6/uL (ref 3.77–5.28)
RDW: 11.8 % (ref 11.7–15.4)
WBC: 5.1 10*3/uL (ref 3.4–10.8)

## 2020-07-19 LAB — LIPID PANEL WITH LDL/HDL RATIO
Cholesterol, Total: 214 mg/dL — ABNORMAL HIGH (ref 100–199)
HDL: 80 mg/dL (ref >39)
LDL Chol Calc (NIH): 123 mg/dL — ABNORMAL HIGH (ref 0–99)
LDL/HDL Ratio: 1.5 ratio (ref 0.0–3.2)
Triglycerides: 61 mg/dL (ref 0–149)
VLDL Cholesterol Cal: 11 mg/dL (ref 5–40)

## 2020-07-19 LAB — COMPREHENSIVE METABOLIC PANEL
ALT: 18 IU/L (ref 0–32)
AST: 14 IU/L (ref 0–40)
Albumin/Globulin Ratio: 1.5 (ref 1.2–2.2)
Albumin: 5 g/dL — ABNORMAL HIGH (ref 3.8–4.8)
Alkaline Phosphatase: 76 IU/L (ref 44–121)
BUN/Creatinine Ratio: 24 — ABNORMAL HIGH (ref 9–23)
BUN: 15 mg/dL (ref 6–20)
Bilirubin Total: 0.5 mg/dL (ref 0.0–1.2)
CO2: 20 mmol/L (ref 20–29)
Calcium: 10 mg/dL (ref 8.7–10.2)
Chloride: 103 mmol/L (ref 96–106)
Creatinine, Ser: 0.63 mg/dL (ref 0.57–1.00)
GFR calc Af Amer: 137 mL/min/{1.73_m2} (ref >59)
GFR calc non Af Amer: 119 mL/min/{1.73_m2} (ref >59)
Globulin, Total: 3.4 g/dL (ref 1.5–4.5)
Glucose: 76 mg/dL (ref 65–99)
Potassium: 4.7 mmol/L (ref 3.5–5.2)
Sodium: 138 mmol/L (ref 134–144)
Total Protein: 8.4 g/dL (ref 6.0–8.5)

## 2020-07-19 LAB — TSH+FREE T4
Free T4: 1.15 ng/dL (ref 0.82–1.77)
TSH: 1.17 u[IU]/mL (ref 0.450–4.500)

## 2020-07-19 LAB — VITAMIN D 25 HYDROXY (VIT D DEFICIENCY, FRACTURES): Vit D, 25-Hydroxy: 14.5 ng/mL — ABNORMAL LOW (ref 30.0–100.0)

## 2020-07-19 LAB — VITAMIN B12: Vitamin B-12: 594 pg/mL (ref 232–1245)

## 2020-07-19 MED ORDER — VITAMIN D (ERGOCALCIFEROL) 1.25 MG (50000 UNIT) PO CAPS: 50000.0000 [IU] | ORAL_CAPSULE | ORAL | 1 refills | Status: DC

## 2020-08-22 ENCOUNTER — Encounter: Payer: Self-pay | Admitting: Hospice and Palliative Medicine

## 2020-08-22 ENCOUNTER — Ambulatory Visit: Payer: No Typology Code available for payment source | Admitting: Hospice and Palliative Medicine

## 2020-08-22 ENCOUNTER — Other Ambulatory Visit: Payer: Self-pay

## 2020-08-22 VITALS — BP 118/72 | HR 65 | Temp 98.4°F | Resp 16 | Ht 61.0 in | Wt 96.8 lb

## 2020-08-22 DIAGNOSIS — G4700 Insomnia, unspecified: Secondary | ICD-10-CM

## 2020-08-22 DIAGNOSIS — E782 Mixed hyperlipidemia: Secondary | ICD-10-CM

## 2020-08-22 DIAGNOSIS — F411 Generalized anxiety disorder: Secondary | ICD-10-CM | POA: Diagnosis not present

## 2020-08-22 DIAGNOSIS — E559 Vitamin D deficiency, unspecified: Secondary | ICD-10-CM | POA: Diagnosis not present

## 2020-08-22 MED ORDER — ESCITALOPRAM OXALATE 10 MG PO TABS: 10.0000 mg | ORAL_TABLET | Freq: Every day | ORAL | 1 refills | Status: DC

## 2020-08-22 MED ORDER — TRAZODONE HCL 50 MG PO TABS: 25.0000 mg | ORAL_TABLET | Freq: Every evening | ORAL | 1 refills | Status: DC | PRN

## 2020-08-22 NOTE — Progress Notes (Signed)
Mariah Bird [address] [place], [name]  Internal MEDICINE  Office Visit Note  Patient Name: Mariah Bird  [id number]  [id number]  Date of Service: 08/24/2020  Chief Complaint  Patient presents with  . Follow-up    6 week    HPI Patient is here for routine follow-up At our last visit started on Lexapro for anxiety and depression, started on low dose, has noticed a small improvement in her symptoms but feels as though her dose needs to be increased Also started on hydroxyzine for insomnia at last visit, this had not been helpful in preventing insomnia, she is still struggling with her sleep, has difficulty falling and staying asleep  Able to get in with GYN--discussed her results of recent PAP with them--plan is to repeat PAP in a few months  Reviewed recent labs--slightly abnormal lipid panel, vitamin D low   Current Medication: Outpatient Encounter Medications as of 08/22/2020  Medication Sig Note  . cyclobenzaprine (FLEXERIL) 10 MG tablet  02/02/2017: PRN  . escitalopram (LEXAPRO) 10 MG tablet Take 1 tablet (10 mg total) by mouth daily.   . hydrOXYzine (VISTARIL) 25 MG capsule Take 1 capsule (25 mg total) by mouth at bedtime as needed.   . traZODone (DESYREL) 50 MG tablet Take 0.5-1 tablets (25-50 mg total) by mouth at bedtime as needed for sleep.   . Vitamin D, Ergocalciferol, (DRISDOL) 1.25 MG (50000 UNIT) CAPS capsule Take 1 capsule (50,000 Units total) by mouth every 7 (seven) days.   . [DISCONTINUED] escitalopram (LEXAPRO) 5 MG tablet Take 1 tablet (5 mg total) by mouth daily.    No facility-administered encounter medications on file as of 08/22/2020.    Surgical History: Past Surgical History:  Procedure Laterality Date  . KNEE ARTHROSCOPY    . NASAL SINUS SURGERY  12/18/2016    Medical History: Past Medical History:  Diagnosis Date  . Anxiety   . Depression   . Nephrolithiasis     Family History: Family History  Problem Relation  Age of Onset  . Kidney Stones Father   . Kidney Stones Paternal Grandmother   . Kidney disease Maternal Uncle   . Bladder Cancer Neg Hx   . Kidney cancer Neg Hx   . Prostate cancer Neg Hx     Social History   Socioeconomic History  . Marital status: Single    Spouse name: Not on file  . Number of children: Not on file  . Years of education: Not on file  . Highest education level: Not on file  Occupational History  . Not on file  Tobacco Use  . Smoking status: Never Smoker  . Smokeless tobacco: Never Used  Vaping Use  . Vaping Use: Never used  Substance and Sexual Activity  . Alcohol use: Yes    Alcohol/week: 7.0 standard drinks    Types: 7 Glasses of wine per week    Comment: about 1-2 glass every other day  . Drug use: No  . Sexual activity: Not on file  Other Topics Concern  . Not on file  Social History Narrative  . Not on file   Social Determinants of Health   Financial Resource Strain: Not on file  Food Insecurity: Not on file  Transportation Needs: Not on file  Physical Activity: Not on file  Stress: Not on file  Social Connections: Not on file  Intimate Partner Violence: Not on file      Review of Systems  Constitutional: Negative for chills, diaphoresis  and fatigue.  HENT: Negative for ear pain, postnasal drip and sinus pressure.   Eyes: Negative for photophobia, discharge, redness, itching and visual disturbance.  Respiratory: Negative for cough, shortness of breath and wheezing.   Cardiovascular: Negative for chest pain, palpitations and leg swelling.  Gastrointestinal: Negative for abdominal pain, constipation, diarrhea, nausea and vomiting.  Genitourinary: Negative for dysuria and flank pain.  Musculoskeletal: Negative for arthralgias, back pain, gait problem and neck pain.  Skin: Negative for color change.  Allergic/Immunologic: Negative for environmental allergies and food allergies.  Neurological: Negative for dizziness and headaches.   Hematological: Does not bruise/bleed easily.  Psychiatric/Behavioral: Positive for sleep disturbance. Negative for agitation, behavioral problems (depression) and hallucinations. The patient is nervous/anxious.     Vital Signs: BP 118/72   Pulse 65   Temp 98.4 F (36.9 C)   Resp 16   Ht 5\' 1"  (1.549 m)   Wt 96 lb 12.8 oz (43.9 kg)   SpO2 98%   BMI 18.29 kg/m    Physical Exam Vitals reviewed.  Constitutional:      Appearance: Normal appearance. She is normal weight.  Cardiovascular:     Rate and Rhythm: Normal rate and regular rhythm.     Pulses: Normal pulses.     Heart sounds: Normal heart sounds.  Pulmonary:     Effort: Pulmonary effort is normal.     Breath sounds: Normal breath sounds.  Musculoskeletal:        General: Normal range of motion.  Skin:    General: Skin is warm.  Neurological:     General: No focal deficit present.     Mental Status: She is alert and oriented to person, place, and time. Mental status is at baseline.  Psychiatric:        Mood and Affect: Mood normal.        Behavior: Behavior normal.        Thought Content: Thought content normal.        Judgment: Judgment normal.    Assessment/Plan: 1. Vitamin D deficiency Continue with weekly supplements--continue to monitor levels  2. GAD (generalized anxiety disorder) Increase dose of Lexapro to 10 mg daily to further improve symptoms, will closely monitor response to increased therapy - escitalopram (LEXAPRO) 10 MG tablet; Take 1 tablet (10 mg total) by mouth daily.  Dispense: 90 tablet; Refill: 1  3. Mixed hyperlipidemia Discussed slightly abnormal lipid levels and ways to improve diet to stabilize levels  4. Insomnia, unspecified type Stop hydroxyzine as it was ineffective and start trazodone, advised to start with 1/2 tablet (25 mg), if not effective she may increase to 1 tablet (50 mg) - traZODone (DESYREL) 50 MG tablet; Take 0.5-1 tablets (25-50 mg total) by mouth at bedtime as needed  for sleep.  Dispense: 30 tablet; Refill: 1  General Counseling: Mariah Bird verbalizes understanding of the findings of todays visit and agrees with plan of treatment. I have discussed any further diagnostic evaluation that may be needed or ordered today. We also reviewed her medications today. she has been encouraged to call the office with any questions or concerns that should arise related to todays visit.   Meds ordered this encounter  Medications  . escitalopram (LEXAPRO) 10 MG tablet    Sig: Take 1 tablet (10 mg total) by mouth daily.    Dispense:  90 tablet    Refill:  1  . traZODone (DESYREL) 50 MG tablet    Sig: Take 0.5-1 tablets (25-50 mg total) by mouth  at bedtime as needed for sleep.    Dispense:  30 tablet    Refill:  1    Time spent: 30 Minutes Time spent includes review of chart, medications, test results and follow-up plan with the patient.  This patient was seen by Theodoro Grist AGNP-C in Collaboration with Dr Lavera Guise as a part of collaborative care agreement     Tanna Furry. Addalyne Vandehei AGNP-C Internal medicine

## 2020-08-24 ENCOUNTER — Encounter: Payer: Self-pay | Admitting: Hospice and Palliative Medicine

## 2020-09-30 ENCOUNTER — Other Ambulatory Visit: Payer: Self-pay | Admitting: Hospice and Palliative Medicine

## 2020-10-03 ENCOUNTER — Other Ambulatory Visit: Payer: Self-pay | Admitting: Hospice and Palliative Medicine

## 2020-10-03 DIAGNOSIS — F3289 Other specified depressive episodes: Secondary | ICD-10-CM

## 2020-10-03 DIAGNOSIS — F411 Generalized anxiety disorder: Secondary | ICD-10-CM

## 2020-11-28 ENCOUNTER — Ambulatory Visit: Payer: No Typology Code available for payment source | Admitting: Hospice and Palliative Medicine

## 2020-11-28 ENCOUNTER — Other Ambulatory Visit: Payer: Self-pay

## 2020-11-28 ENCOUNTER — Encounter: Payer: Self-pay | Admitting: Hospice and Palliative Medicine

## 2020-11-28 VITALS — BP 118/82 | HR 77 | Temp 98.3°F | Resp 16 | Ht 61.0 in | Wt 95.2 lb

## 2020-11-28 DIAGNOSIS — G4700 Insomnia, unspecified: Secondary | ICD-10-CM | POA: Diagnosis not present

## 2020-11-28 DIAGNOSIS — E559 Vitamin D deficiency, unspecified: Secondary | ICD-10-CM | POA: Diagnosis not present

## 2020-11-28 DIAGNOSIS — F411 Generalized anxiety disorder: Secondary | ICD-10-CM | POA: Diagnosis not present

## 2020-11-28 MED ORDER — ESCITALOPRAM OXALATE 20 MG PO TABS: 20.0000 mg | ORAL_TABLET | Freq: Every day | ORAL | 1 refills | Status: DC

## 2020-11-28 NOTE — Progress Notes (Signed)
[facility] [place], [place] [address]  Internal MEDICINE  Office Visit Note  Patient Name: Mariah Bird  [id number]  [id number]  Date of Service: 12/04/2020  Chief Complaint  Patient presents with  . Medication Management    3 month fup    HPI Patient is here for routine follow-up Over the last few weeks she has doubled her dose of Lexapro to 20 mg daily Prior to doubling her dose she felt that her anxiety was becoming uncontrolled again--becoming more irritable Since increasing her dose she has noticed an improvement in her symptoms Has also noticed an improvement in her sleep, will occasionally still have some restless nights but majority of nights she sleeps well  Trazodone has also been helpful at improving insomnia  Current Medication: Outpatient Encounter Medications as of 11/28/2020  Medication Sig Note  . cyclobenzaprine (FLEXERIL) 10 MG tablet  02/02/2017: PRN  . escitalopram (LEXAPRO) 20 MG tablet Take 1 tablet (20 mg total) by mouth daily.   . hydrOXYzine (VISTARIL) 25 MG capsule Take 1 capsule (25 mg total) by mouth at bedtime as needed.   . traZODone (DESYREL) 50 MG tablet Take 0.5-1 tablets (25-50 mg total) by mouth at bedtime as needed for sleep.   . [DISCONTINUED] escitalopram (LEXAPRO) 10 MG tablet Take 1 tablet (10 mg total) by mouth daily.   . [DISCONTINUED] Vitamin D, Ergocalciferol, (DRISDOL) 1.25 MG (50000 UNIT) CAPS capsule TAKE 1 CAPSULE BY MOUTH EVERY 7 DAYS    No facility-administered encounter medications on file as of 11/28/2020.    Surgical History: Past Surgical History:  Procedure Laterality Date  . KNEE ARTHROSCOPY    . NASAL SINUS SURGERY  12/18/2016    Medical History: Past Medical History:  Diagnosis Date  . Anxiety   . Depression   . Nephrolithiasis     Family History: Family History  Problem Relation Age of Onset  . Kidney Stones Father   . Kidney Stones Paternal Grandmother   . Kidney disease  Maternal Uncle   . Bladder Cancer Neg Hx   . Kidney cancer Neg Hx   . Prostate cancer Neg Hx     Social History   Socioeconomic History  . Marital status: Single    Spouse name: Not on file  . Number of children: Not on file  . Years of education: Not on file  . Highest education level: Not on file  Occupational History  . Not on file  Tobacco Use  . Smoking status: Never Smoker  . Smokeless tobacco: Never Used  Vaping Use  . Vaping Use: Never used  Substance and Sexual Activity  . Alcohol use: Yes    Alcohol/week: 7.0 standard drinks    Types: 7 Glasses of wine per week    Comment: about 1-2 glass every other day  . Drug use: No  . Sexual activity: Not on file  Other Topics Concern  . Not on file  Social History Narrative  . Not on file   Social Determinants of Health   Financial Resource Strain: Not on file  Food Insecurity: Not on file  Transportation Needs: Not on file  Physical Activity: Not on file  Stress: Not on file  Social Connections: Not on file  Intimate Partner Violence: Not on file      Review of Systems  Constitutional: Negative for chills, diaphoresis and fatigue.  HENT: Negative for ear pain, postnasal drip and sinus pressure.   Eyes: Negative for photophobia, discharge, redness, itching  and visual disturbance.  Respiratory: Negative for cough, shortness of breath and wheezing.   Cardiovascular: Negative for chest pain, palpitations and leg swelling.  Gastrointestinal: Negative for abdominal pain, constipation, diarrhea, nausea and vomiting.  Genitourinary: Negative for dysuria and flank pain.  Musculoskeletal: Negative for arthralgias, back pain, gait problem and neck pain.  Skin: Negative for color change.  Allergic/Immunologic: Negative for environmental allergies and food allergies.  Neurological: Negative for dizziness and headaches.  Hematological: Does not bruise/bleed easily.  Psychiatric/Behavioral: Negative for agitation,  behavioral problems (depression) and hallucinations.    Vital Signs: BP 118/82   Pulse 77   Temp 98.3 F (36.8 C)   Resp 16   Ht 5\' 1"  (1.549 m)   Wt 95 lb 3.2 oz (43.2 kg)   SpO2 99%   BMI 17.99 kg/m    Physical Exam Vitals reviewed.  Constitutional:      Appearance: Normal appearance. She is normal weight.  Cardiovascular:     Rate and Rhythm: Normal rate and regular rhythm.     Pulses: Normal pulses.     Heart sounds: Normal heart sounds.  Pulmonary:     Effort: Pulmonary effort is normal.     Breath sounds: Normal breath sounds.  Abdominal:     General: Abdomen is flat.     Palpations: Abdomen is soft.  Musculoskeletal:        General: Normal range of motion.     Cervical back: Normal range of motion.  Skin:    General: Skin is warm.  Neurological:     General: No focal deficit present.     Mental Status: She is alert and oriented to person, place, and time. Mental status is at baseline.  Psychiatric:        Mood and Affect: Mood normal.        Behavior: Behavior normal.        Thought Content: Thought content normal.        Judgment: Judgment normal.    Assessment/Plan: 1. Vitamin D deficiency Continue with script sent at previous visits, once completed with script advised to start OTC vitamin D and calcium supplement  2. GAD (generalized anxiety disorder) Sent increased dose of Lexapro as anxiety has been better controlled on increased dose, continue to monitor - escitalopram (LEXAPRO) 20 MG tablet; Take 1 tablet (20 mg total) by mouth daily.  Dispense: 90 tablet; Refill: 1  3. Insomnia, unspecified type Continue with trazodone as needed for insomnia  General Counseling: Lucella verbalizes understanding of the findings of todays visit and agrees with plan of treatment. I have discussed any further diagnostic evaluation that may be needed or ordered today. We also reviewed her medications today. she has been encouraged to call the office with any  questions or concerns that should arise related to todays visit.  Meds ordered this encounter  Medications  . escitalopram (LEXAPRO) 20 MG tablet    Sig: Take 1 tablet (20 mg total) by mouth daily.    Dispense:  90 tablet    Refill:  1    Time spent: 30 Minutes Time spent includes review of chart, medications, test results and follow-up plan with the patient.  This patient was seen by Theodoro Grist AGNP-C in Collaboration with Dr Lavera Guise as a part of collaborative care agreement     Tanna Furry. Arloa Prak AGNP-C Internal medicine

## 2020-12-04 ENCOUNTER — Encounter: Payer: Self-pay | Admitting: Hospice and Palliative Medicine

## 2020-12-04 ENCOUNTER — Other Ambulatory Visit: Payer: Self-pay

## 2020-12-04 MED ORDER — VITAMIN D (ERGOCALCIFEROL) 1.25 MG (50000 UNIT) PO CAPS: 1.0000 | ORAL_CAPSULE | ORAL | 1 refills | Status: DC

## 2021-01-01 ENCOUNTER — Other Ambulatory Visit: Payer: Self-pay

## 2021-02-27 ENCOUNTER — Ambulatory Visit: Payer: No Typology Code available for payment source | Admitting: Physician Assistant

## 2021-03-21 ENCOUNTER — Ambulatory Visit: Payer: No Typology Code available for payment source | Admitting: Physician Assistant

## 2021-05-01 ENCOUNTER — Other Ambulatory Visit: Payer: Self-pay

## 2021-05-01 DIAGNOSIS — F411 Generalized anxiety disorder: Secondary | ICD-10-CM

## 2021-05-01 MED ORDER — ESCITALOPRAM OXALATE 20 MG PO TABS: 20.0000 mg | ORAL_TABLET | Freq: Every day | ORAL | 0 refills | Status: DC

## 2021-05-03 ENCOUNTER — Emergency Department (HOSPITAL_COMMUNITY)
Admission: EM | Admit: 2021-05-03 | Discharge: 2021-05-03 | Disposition: A | Discharge status: Home / Self Care | Payer: BLUE CROSS/BLUE SHIELD | Attending: Emergency Medicine | Admitting: Emergency Medicine

## 2021-05-03 DIAGNOSIS — K2900 Acute gastritis without bleeding: Secondary | ICD-10-CM

## 2021-05-03 DIAGNOSIS — R1110 Vomiting, unspecified: Secondary | ICD-10-CM | POA: Insufficient documentation

## 2021-05-03 DIAGNOSIS — R102 Pelvic and perineal pain: Secondary | ICD-10-CM | POA: Insufficient documentation

## 2021-05-03 DIAGNOSIS — F1290 Cannabis use, unspecified, uncomplicated: Secondary | ICD-10-CM | POA: Insufficient documentation

## 2021-05-03 DIAGNOSIS — Y900 Blood alcohol level of less than 20 mg/100 ml: Secondary | ICD-10-CM | POA: Insufficient documentation

## 2021-05-03 DIAGNOSIS — Z79899 Other long term (current) drug therapy: Secondary | ICD-10-CM | POA: Insufficient documentation

## 2021-05-03 LAB — RAPID URINE DRUG SCREEN, HOSP PERFORMED
Amphetamines: NOT DETECTED
Barbiturates: NOT DETECTED
Benzodiazepines: NOT DETECTED
Cocaine: NOT DETECTED
Opiates: NOT DETECTED
Tetrahydrocannabinol: POSITIVE — AB

## 2021-05-03 LAB — CBC WITH DIFFERENTIAL/PLATELET
Abs Immature Granulocytes: 0.02 10*3/uL (ref 0.00–0.07)
Basophils Absolute: 0 10*3/uL (ref 0.0–0.1)
Basophils Relative: 1 %
Eosinophils Absolute: 0 10*3/uL (ref 0.0–0.5)
Eosinophils Relative: 0 %
HCT: 39.4 % (ref 36.0–46.0)
Hemoglobin: 13.5 g/dL (ref 12.0–15.0)
Immature Granulocytes: 0 %
Lymphocytes Relative: 18 %
Lymphs Abs: 1.1 10*3/uL (ref 0.7–4.0)
MCH: 33.5 pg (ref 26.0–34.0)
MCHC: 34.3 g/dL (ref 30.0–36.0)
MCV: 97.8 fL (ref 80.0–100.0)
Monocytes Absolute: 0.6 10*3/uL (ref 0.1–1.0)
Monocytes Relative: 11 %
Neutro Abs: 4.2 10*3/uL (ref 1.7–7.7)
Neutrophils Relative %: 70 %
Platelets: 263 10*3/uL (ref 150–400)
RBC: 4.03 MIL/uL (ref 3.87–5.11)
RDW: 12.6 % (ref 11.5–15.5)
WBC: 5.9 10*3/uL (ref 4.0–10.5)
nRBC: 0 % (ref 0.0–0.2)

## 2021-05-03 LAB — URINALYSIS, ROUTINE W REFLEX MICROSCOPIC
Bilirubin Urine: NEGATIVE
Glucose, UA: NEGATIVE mg/dL
Hgb urine dipstick: NEGATIVE
Ketones, ur: 80 mg/dL — AB
Leukocytes,Ua: NEGATIVE
Nitrite: NEGATIVE
Protein, ur: NEGATIVE mg/dL
Specific Gravity, Urine: 1.017 (ref 1.005–1.030)
pH: 9 — ABNORMAL HIGH (ref 5.0–8.0)

## 2021-05-03 LAB — COMPREHENSIVE METABOLIC PANEL
ALT: 22 U/L (ref 0–44)
AST: 19 U/L (ref 15–41)
Albumin: 4.8 g/dL (ref 3.5–5.0)
Alkaline Phosphatase: 57 U/L (ref 38–126)
Anion gap: 8 (ref 5–15)
BUN: 10 mg/dL (ref 6–20)
CO2: 22 mmol/L (ref 22–32)
Calcium: 9.8 mg/dL (ref 8.9–10.3)
Chloride: 105 mmol/L (ref 98–111)
Creatinine, Ser: 0.43 mg/dL — ABNORMAL LOW (ref 0.44–1.00)
GFR, Estimated: >60 mL/min (ref >60)
Glucose, Bld: 114 mg/dL — ABNORMAL HIGH (ref 70–99)
Potassium: 3.5 mmol/L (ref 3.5–5.1)
Sodium: 135 mmol/L (ref 135–145)
Total Bilirubin: 0.7 mg/dL (ref 0.3–1.2)
Total Protein: 8.3 g/dL — ABNORMAL HIGH (ref 6.5–8.1)

## 2021-05-03 LAB — LIPASE, BLOOD: Lipase: 23 U/L (ref 11–51)

## 2021-05-03 LAB — ETHANOL: Alcohol, Ethyl (B): <10 mg/dL (ref <10)

## 2021-05-03 LAB — I-STAT BETA HCG BLOOD, ED (MC, WL, AP ONLY): I-stat hCG, quantitative: <5 m[IU]/mL (ref <5)

## 2021-05-03 MED ORDER — HYDROMORPHONE HCL 1 MG/ML IJ SOLN
0.5000 mg | Freq: Once | INTRAMUSCULAR | Status: AC
  Administered 2021-05-03: 0.5 mg via INTRAVENOUS
  Filled 2021-05-03: qty 1

## 2021-05-03 MED ORDER — ONDANSETRON 4 MG PO TBDP
ORAL_TABLET | ORAL | 0 refills | Status: DC
Start: 2021-05-03 — End: 2021-11-20

## 2021-05-03 MED ORDER — SODIUM CHLORIDE 0.9 % IV BOLUS
1000.0000 mL | Freq: Once | INTRAVENOUS | Status: AC
  Administered 2021-05-03: 1000 mL via INTRAVENOUS

## 2021-05-03 MED ORDER — ONDANSETRON HCL 4 MG/2ML IJ SOLN
4.0000 mg | Freq: Once | INTRAMUSCULAR | Status: AC
  Administered 2021-05-03: 4 mg via INTRAVENOUS
  Filled 2021-05-03: qty 2

## 2021-05-03 MED ORDER — PANTOPRAZOLE SODIUM 40 MG IV SOLR
40.0000 mg | Freq: Once | INTRAVENOUS | Status: AC
  Administered 2021-05-03: 40 mg via INTRAVENOUS
  Filled 2021-05-03: qty 40

## 2021-05-03 MED ORDER — PANTOPRAZOLE SODIUM 20 MG PO TBEC: 20.0000 mg | DELAYED_RELEASE_TABLET | Freq: Every day | ORAL | 0 refills | Status: DC

## 2021-05-03 MED ORDER — ONDANSETRON 4 MG PO TBDP
4.0000 mg | ORAL_TABLET | Freq: Once | ORAL | Status: AC
  Administered 2021-05-03: 4 mg via ORAL
  Filled 2021-05-03: qty 1

## 2021-05-03 NOTE — ED Provider Notes (Signed)
[facility] DEPT Provider Note   CSN: [id number] Arrival date & time: 05/03/21  1205     History Chief Complaint  Patient presents with   Abdominal Pain   Nausea    Mariah Bird is a 35 y.o. female.  Patient complains of epigastric abdominal pain and vomiting for a few days.  She has a history of irritable bowel  The history is provided by the patient. No language interpreter was used.  Abdominal Pain Pain location:  Generalized Pain quality: aching   Pain radiates to:  Does not radiate Pain severity:  Moderate Onset quality:  Sudden Timing:  Constant Progression:  Waxing and waning Chronicity:  New Context: not awakening from sleep   Relieved by:  Nothing Associated symptoms: vomiting   Associated symptoms: no chest pain, no cough, no diarrhea, no fatigue and no hematuria       Past Medical History:  Diagnosis Date   Anxiety    Depression    Nephrolithiasis     Patient Active Problem List   Diagnosis Date Noted   Acute upper respiratory infection 08/29/2018   Conjunctivitis 08/29/2018   Other fatigue 11/29/2017   Vitamin D deficiency 11/29/2017   Impaired fasting glucose 11/29/2017   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     Family History  Problem Relation Age of Onset   Kidney Stones Father    Kidney Stones Paternal Grandmother    Kidney disease Maternal Uncle    Bladder Cancer Neg Hx    Kidney cancer Neg Hx    Prostate cancer Neg Hx     Social History   Tobacco Use   Smoking status: Never   Smokeless tobacco: Never  Vaping Use   Vaping Use: Never used  Substance Use Topics   Alcohol use: Yes    Alcohol/week: 7.0 standard drinks    Types: 7 Glasses of wine per week    Comment: about 1-2 glass every other day    Drug use: No    Home Medications Prior to Admission medications   Medication Sig Start Date End Date Taking? Authorizing Provider  ondansetron (ZOFRAN ODT) 4 MG disintegrating tablet '4mg'$  ODT q4 hours prn nausea/vomit 05/03/21  Yes Milton Ferguson, MD  pantoprazole (PROTONIX) 20 MG tablet Take 1 tablet (20 mg total) by mouth daily. 05/03/21  Yes Milton Ferguson, MD  cyclobenzaprine (FLEXERIL) 10 MG tablet  03/26/15   [provider]  escitalopram (LEXAPRO) 20 MG tablet Take 1 tablet (20 mg total) by mouth daily. 05/01/21   Lavera Guise, MD  hydrOXYzine (VISTARIL) 25 MG capsule Take 1 capsule (25 mg total) by mouth at bedtime as needed. 07/08/20   Luiz Ochoa, NP  traZODone (DESYREL) 50 MG tablet Take 0.5-1 tablets (25-50 mg total) by mouth at bedtime as needed for sleep. 08/22/20   Luiz Ochoa, NP  Vitamin D, Ergocalciferol, (DRISDOL) 1.25 MG (50000 UNIT) CAPS capsule Take 1 capsule (50,000 Units total) by mouth every 7 (seven) days. 12/04/20   Luiz Ochoa, NP    Allergies    Patient has no known allergies.  Review of Systems   Review of Systems  Constitutional:  Negative for appetite change and fatigue.  HENT:  Negative for congestion, ear discharge and  sinus pressure.   Eyes:  Negative for discharge.  Respiratory:  Negative for cough.   Cardiovascular:  Negative for chest pain.  Gastrointestinal:  Positive for abdominal pain and vomiting. Negative for diarrhea.  Genitourinary:  Negative for frequency and hematuria.  Musculoskeletal:  Negative for back pain.  Skin:  Negative for rash.  Neurological:  Negative for seizures and headaches.  Psychiatric/Behavioral:  Negative for hallucinations.    Physical Exam Updated Vital Signs BP 104/61   Pulse 63   Temp 98.9 F (37.2 C) (Oral)   Resp 18   SpO2 100%   Physical Exam Vitals and nursing note reviewed.  Constitutional:      Appearance: She is well-developed.  HENT:     Head: Normocephalic.     Nose: Nose  normal.  Eyes:     General: No scleral icterus.    Conjunctiva/sclera: Conjunctivae normal.  Neck:     Thyroid: No thyromegaly.  Cardiovascular:     Rate and Rhythm: Normal rate and regular rhythm.     Heart sounds: No murmur heard.   No friction rub. No gallop.  Pulmonary:     Breath sounds: No stridor. No wheezing or rales.  Chest:     Chest wall: No tenderness.  Abdominal:     General: There is no distension.     Tenderness: There is abdominal tenderness. There is no rebound.  Musculoskeletal:        General: Normal range of motion.     Cervical back: Neck supple.  Lymphadenopathy:     Cervical: No cervical adenopathy.  Skin:    Findings: No erythema or rash.  Neurological:     Mental Status: She is alert and oriented to person, place, and time.     Motor: No abnormal muscle tone.     Coordination: Coordination normal.  Psychiatric:        Behavior: Behavior normal.    ED Results / Procedures / Treatments   Labs (all labs ordered are listed, but only abnormal results are displayed) Labs Reviewed  COMPREHENSIVE METABOLIC PANEL - Abnormal; Notable for the following components:      Result Value   Glucose, Bld 114 (*)    Creatinine, Ser 0.43 (*)    Total Protein 8.3 (*)    All other components within normal limits  URINALYSIS, ROUTINE W REFLEX MICROSCOPIC - Abnormal; Notable for the following components:   pH 9.0 (*)    Ketones, ur 80 (*)    All other components within normal limits  RAPID URINE DRUG SCREEN, HOSP PERFORMED - Abnormal; Notable for the following components:   Tetrahydrocannabinol POSITIVE (*)    All other components within normal limits  CBC WITH DIFFERENTIAL/PLATELET  LIPASE, BLOOD  ETHANOL  I-STAT BETA HCG BLOOD, ED ([facility], [facility], [facility] ONLY)    EKG None  Radiology No results found.  Procedures Procedures   Medications Ordered in ED Medications  ondansetron (ZOFRAN-ODT) disintegrating tablet 4 mg (4 mg Oral Given 05/03/21 1244)  sodium  chloride 0.9 % bolus 1,000 mL (1,000 mLs Intravenous New Bag/Given 05/03/21 1725)  pantoprazole (PROTONIX) injection 40 mg (40 mg Intravenous Given 05/03/21 1730)  HYDROmorphone (DILAUDID) injection 0.5 mg (0.5 mg Intravenous Given 05/03/21 1730)  ondansetron (ZOFRAN) injection 4 mg (4 mg Intravenous Given 05/03/21 1729)    ED Course  I have reviewed the triage vital signs and the nursing notes.  Pertinent labs & imaging results that were available during my care of the patient were reviewed by  me and considered in my medical decision making (see chart for details).    MDM Rules/Calculators/A&P                           Patient with abdominal pain and normal labs.  Patient improved with Protonix pain medicine nausea medicine.  Suspect gastritis.  She also has a history of irritable bowel.  She will follow-up with her PCP and is put on protonic and Zofran Final Clinical Impression(s) / ED Diagnoses Final diagnoses:  Acute superficial gastritis without hemorrhage    Rx / DC Orders ED Discharge Orders          Ordered    pantoprazole (PROTONIX) 20 MG tablet  Daily        05/03/21 1935    ondansetron (ZOFRAN ODT) 4 MG disintegrating tablet        05/03/21 Ysidro Evert, MD 05/03/21 [phone]

## 2021-05-03 NOTE — ED Triage Notes (Signed)
Pt arrives via EMS from home. Pt c/o abdominal pian, nausea, and vomiting x 1 week.

## 2021-05-03 NOTE — ED Provider Notes (Signed)
Emergency Medicine Provider Triage Evaluation Note  Mariah Bird , a 35 y.o. female  was evaluated in triage.  Pt complains of abd pain.  Review of Systems  Positive: Abd pain, n/v/d Negative: Fever, chills, dysuria  Physical Exam  BP (!) 159/103 (BP Location: Right Arm)   Pulse 75   Temp 98.9 F (37.2 C) (Oral)   Resp 18   SpO2 100%  Gen:   Awake, appears uncomfortable Resp:  Normal effort  MSK:   Moves extremities without difficulty  Other:  TTP abdomen diffusely  Medical Decision Making  Medically screening exam initiated at 12:19 PM.  Appropriate orders placed.  Mariah Bird was informed that the remainder of the evaluation will be completed by another provider, this initial triage assessment does not replace that evaluation, and the importance of remaining in the ED until their evaluation is complete.  Pt with crampy abd pain along with n/v/d since this AM.  Admits drinking alcohol and marijuana use last night when she went out.     Domenic Moras, PA-C 05/03/21 1220    [name], MD 05/03/21 [phone]

## 2021-05-03 NOTE — Discharge Instructions (Addendum)
Follow-up with your family doctor Wednesday as planned

## 2021-05-06 ENCOUNTER — Encounter: Payer: Self-pay | Admitting: Nurse Practitioner

## 2021-05-06 ENCOUNTER — Other Ambulatory Visit: Payer: Self-pay

## 2021-05-06 ENCOUNTER — Ambulatory Visit: Payer: BLUE CROSS/BLUE SHIELD | Admitting: Nurse Practitioner

## 2021-05-06 VITALS — BP 120/80 | HR 63 | Temp 98.4°F | Resp 16 | Ht 61.0 in | Wt 92.0 lb

## 2021-05-06 DIAGNOSIS — R7301 Impaired fasting glucose: Secondary | ICD-10-CM

## 2021-05-06 DIAGNOSIS — R197 Diarrhea, unspecified: Secondary | ICD-10-CM

## 2021-05-06 DIAGNOSIS — E538 Deficiency of other specified B group vitamins: Secondary | ICD-10-CM

## 2021-05-06 DIAGNOSIS — M064 Inflammatory polyarthropathy: Secondary | ICD-10-CM

## 2021-05-06 DIAGNOSIS — R1084 Generalized abdominal pain: Secondary | ICD-10-CM

## 2021-05-06 DIAGNOSIS — F411 Generalized anxiety disorder: Secondary | ICD-10-CM

## 2021-05-06 DIAGNOSIS — E559 Vitamin D deficiency, unspecified: Secondary | ICD-10-CM

## 2021-05-06 DIAGNOSIS — R824 Acetonuria: Secondary | ICD-10-CM

## 2021-05-06 LAB — POCT GLYCOSYLATED HEMOGLOBIN (HGB A1C): Hemoglobin A1C: 4.9 % (ref 4.0–5.6)

## 2021-05-06 MED ORDER — DIPHENOXYLATE-ATROPINE 2.5-0.025 MG PO TABS: 1.0000 | ORAL_TABLET | Freq: Four times a day (QID) | ORAL | 0 refills | Status: DC | PRN

## 2021-05-06 MED ORDER — ESCITALOPRAM OXALATE 20 MG PO TABS: 20.0000 mg | ORAL_TABLET | Freq: Every day | ORAL | 0 refills | Status: DC

## 2021-05-06 MED ORDER — VITAMIN D (ERGOCALCIFEROL) 1.25 MG (50000 UNIT) PO CAPS: 50000.0000 [IU] | ORAL_CAPSULE | ORAL | 1 refills | Status: DC

## 2021-05-06 NOTE — Progress Notes (Signed)
[facility] [place], [place] [address]  Internal MEDICINE  Office Visit Note  Patient Name: Mariah Bird  [id number]  [id number]  Date of Service: 05/06/2021  Chief Complaint  Patient presents with   Acute Visit    Pt went to ED   Diarrhea   Nausea     HPI [name] presents for an acute sick visit after going to the ER for abdominal pain and nausea. She was diagnosed with gastritis. She has had GI issues off and on for a while now. She reports abdominal pain, diarrhea, nausea, vomiting, excess saliva, and loss of appetite. She reports seeing mucous in her stool but denies any blood in her stool. She has experienced bowel incontinence due to diarrhea and has had constipation in the past. She also thought she saw a hook worm in her stool. Additionally, she reports unintentional weight loss since 2019, easy bruising, chills, intolerance to hot/cold, blurred vision and cough.  She reports multiple joint pains and fatigue for several months. She is unsure of any family history of RA or other autoimmune disorders.     Current Medication:  Outpatient Encounter Medications as of 05/06/2021  Medication Sig Note   cyclobenzaprine (FLEXERIL) 10 MG tablet  02/02/2017: PRN   hydrOXYzine (VISTARIL) 25 MG capsule Take 1 capsule (25 mg total) by mouth at bedtime as needed.    ondansetron (ZOFRAN ODT) 4 MG disintegrating tablet 56m ODT q4 hours prn nausea/vomit    pantoprazole (PROTONIX) 20 MG tablet Take 1 tablet (20 mg total) by mouth daily.    traZODone (DESYREL) 50 MG tablet Take 0.5-1 tablets (25-50 mg total) by mouth at bedtime as needed for sleep.    [DISCONTINUED] diphenoxylate-atropine (LOMOTIL) 2.5-0.025 MG tablet Take 1 tablet by mouth 4 (four) times daily as needed for diarrhea or loose stools.    [DISCONTINUED] escitalopram (LEXAPRO) 20 MG tablet Take 1 tablet (20 mg total) by mouth daily.    [DISCONTINUED] Vitamin D, Ergocalciferol, (DRISDOL) 1.25 MG (50000  UNIT) CAPS capsule Take 1 capsule (50,000 Units total) by mouth every 7 (seven) days.    diphenoxylate-atropine (LOMOTIL) 2.5-0.025 MG tablet Take 1 tablet by mouth 4 (four) times daily as needed for diarrhea or loose stools.    escitalopram (LEXAPRO) 20 MG tablet Take 1 tablet (20 mg total) by mouth daily.    Vitamin D, Ergocalciferol, (DRISDOL) 1.25 MG (50000 UNIT) CAPS capsule Take 1 capsule (50,000 Units total) by mouth every 7 (seven) days.    No facility-administered encounter medications on file as of 05/06/2021.      Medical History: Past Medical History:  Diagnosis Date   Anxiety    Depression    Nephrolithiasis      Vital Signs: BP 120/80   Pulse 63   Temp 98.4 F (36.9 C)   Resp 16   Ht _0  (1.549 m)   Wt 92 lb (41.7 kg)   SpO2 98%   BMI 17.38 kg/m    Review of Systems  Constitutional:  Positive for chills and fatigue. Negative for fever.  HENT:  Negative for nosebleeds, sore throat and trouble swallowing.   Eyes: Negative.   Respiratory:  Positive for cough. Negative for chest tightness, shortness of breath and wheezing.   Cardiovascular: Negative.  Negative for chest pain and palpitations.  Gastrointestinal:  Positive for abdominal pain, constipation, diarrhea, nausea and vomiting. Negative for abdominal distention, anal bleeding, blood in stool and rectal pain.  Endocrine: Positive for cold intolerance and heat  intolerance.  Genitourinary: Negative.   Musculoskeletal:  Positive for arthralgias and joint swelling.  Skin: Negative.  Negative for rash.  Neurological:  Positive for weakness. Negative for dizziness, light-headedness and headaches.  Hematological:  Bruises/bleeds easily.  Psychiatric/Behavioral:  Negative for self-injury and suicidal ideas.    Physical Exam Vitals reviewed.  Constitutional:      General: She is not in acute distress.    Appearance: She is well-developed, well-groomed and underweight. She is ill-appearing.  HENT:     Head:  Normocephalic and atraumatic.  Eyes:     Extraocular Movements: Extraocular movements intact.     Pupils: Pupils are equal, round, and reactive to light.  Cardiovascular:     Rate and Rhythm: Normal rate and regular rhythm.     Pulses: Normal pulses.     Heart sounds: Normal heart sounds. No murmur heard. Pulmonary:     Effort: Pulmonary effort is normal. No respiratory distress.     Breath sounds: No wheezing.  Abdominal:     General: Bowel sounds are normal. There is no distension.     Palpations: Abdomen is soft. There is no mass.     Tenderness: There is abdominal tenderness. There is no guarding.     Hernia: No hernia is present.  Neurological:     Mental Status: She is alert and oriented to person, place, and time.     Cranial Nerves: No cranial nerve deficit.     Coordination: Coordination normal.     Gait: Gait normal.  Psychiatric:        Mood and Affect: Mood normal.        Behavior: Behavior normal.      Assessment/Plan: 1. Generalized abdominal pain Labs and ultrasound ordered to evaluate possible causes of abdominal pain. Labs to rule out anemia, and assess for electrolyte imbalance. Ultrasound to rule out ileus, small bowel obstruction or other GI related abnormalities.  - CBC with Differential/Platelet - Comprehensive Metabolic Panel (CMET) - US Abdomen Complete; Future  2. Diarrhea, unspecified type Prn medication prescribed to stop diarrhea. Labs ordered to assess for dehydration and electrolyte imbalances related to fluid loss from diarrhea. Ultrasound ordered to evaluate for possible causes of GI symptoms, see problem #1. - CBC with Differential/Platelet - Comprehensive Metabolic Panel (CMET) - US Abdomen Complete; Future - diphenoxylate-atropine (LOMOTIL) 2.5-0.025 MG tablet; Take 1 tablet by mouth 4 (four) times daily as needed for diarrhea or loose stools.  Dispense: 30 tablet; Refill: 0  3. Inflammatory polyarthritis [facility]) Basic rheumatology labs and  inflammatory markers ordered to evaluate and assess polyarthritis and chronic fatigue, will consider possible rheumatology referral pending lab results.  - ANA Direct w/Reflex if Positive - Sed Rate (ESR) - C-reactive protein - Rheumatoid Factor  4. Ketonuria Check A1C, A1C is normal - POCT glycosylated hemoglobin (Hb A1C)  5. Impaired fasting glucose A1C is normal - POCT glycosylated hemoglobin (Hb A1C)  6. B12 deficiency Rule out vitamin deficiencies related to anemia and poor nutritional intake.  - B12 and Folate Panel  7. GAD (generalized anxiety disorder) Refill ordered - escitalopram (LEXAPRO) 20 MG tablet; Take 1 tablet (20 mg total) by mouth daily.  Dispense: 90 tablet; Refill: 0  8. Vitamin D deficiency Refill ordered - Vitamin D, Ergocalciferol, (DRISDOL) 1.25 MG (50000 UNIT) CAPS capsule; Take 1 capsule (50,000 Units total) by mouth every 7 (seven) days.  Dispense: 15 capsule; Refill: 1   General Counseling: Danniel verbalizes understanding of the findings of todays visit and agrees  with plan of treatment. I have discussed any further diagnostic evaluation that may be needed or ordered today. We also reviewed her medications today. she has been encouraged to call the office with any questions or concerns that should arise related to todays visit.    Counseling:    Orders Placed This Encounter  Procedures   US Abdomen Complete   ANA Direct w/Reflex if Positive   Sed Rate (ESR)   C-reactive protein   Rheumatoid Factor   CBC with Differential/Platelet   Comprehensive Metabolic Panel (CMET)   B91 and Folate Panel   POCT glycosylated hemoglobin (Hb A1C)    Meds ordered this encounter  Medications   DISCONTD: diphenoxylate-atropine (LOMOTIL) 2.5-0.025 MG tablet    Sig: Take 1 tablet by mouth 4 (four) times daily as needed for diarrhea or loose stools.    Dispense:  30 tablet    Refill:  0   escitalopram (LEXAPRO) 20 MG tablet    Sig: Take 1 tablet (20 mg  total) by mouth daily.    Dispense:  90 tablet    Refill:  0   Vitamin D, Ergocalciferol, (DRISDOL) 1.25 MG (50000 UNIT) CAPS capsule    Sig: Take 1 capsule (50,000 Units total) by mouth every 7 (seven) days.    Dispense:  15 capsule    Refill:  1   diphenoxylate-atropine (LOMOTIL) 2.5-0.025 MG tablet    Sig: Take 1 tablet by mouth 4 (four) times daily as needed for diarrhea or loose stools.    Dispense:  30 tablet    Refill:  0    Return in about 2 weeks (around 05/20/2021) for F/U, U/S @ [facility], Review labs/test, Milka Windholz PCP.  [place] Controlled Substance Database was reviewed by me for overdose risk score (ORS)  Time spent:30 Minutes Time spent with patient included reviewing progress notes, labs, imaging studies, and discussing plan for follow up.   This patient was seen by Jonetta Osgood, FNP-C in collaboration with Dr. Clayborn Bigness as a part of collaborative care agreement.  Wyett Narine R. Valetta Fuller, MSN, FNP-C Internal Medicine

## 2021-05-07 ENCOUNTER — Ambulatory Visit: Payer: No Typology Code available for payment source | Admitting: Nurse Practitioner

## 2021-05-14 ENCOUNTER — Ambulatory Visit: Payer: BLUE CROSS/BLUE SHIELD

## 2021-05-14 ENCOUNTER — Other Ambulatory Visit: Payer: Self-pay

## 2021-05-14 ENCOUNTER — Encounter: Payer: Self-pay | Admitting: Nurse Practitioner

## 2021-05-14 DIAGNOSIS — R1084 Generalized abdominal pain: Secondary | ICD-10-CM

## 2021-05-14 DIAGNOSIS — M064 Inflammatory polyarthropathy: Secondary | ICD-10-CM

## 2021-05-14 DIAGNOSIS — R197 Diarrhea, unspecified: Secondary | ICD-10-CM

## 2021-05-27 ENCOUNTER — Ambulatory Visit: Payer: Self-pay | Admitting: Nurse Practitioner

## 2021-05-27 DIAGNOSIS — Z0289 Encounter for other administrative examinations: Secondary | ICD-10-CM

## 2021-06-02 ENCOUNTER — Telehealth: Payer: Self-pay

## 2021-06-02 NOTE — Telephone Encounter (Signed)
Pt called asking about referral for rheumatology and per Alyssa pt was to f/u in 2 weeks and pt no show'd.  Pt was informed of missed appt and informed that alyssa will send referral but she needs to make f/u appt.  Pt was sent to appt desk to sched.

## 2021-06-19 ENCOUNTER — Ambulatory Visit: Payer: BLUE CROSS/BLUE SHIELD | Admitting: Nurse Practitioner

## 2021-06-19 ENCOUNTER — Other Ambulatory Visit: Payer: Self-pay

## 2021-06-19 ENCOUNTER — Encounter: Payer: Self-pay | Admitting: Nurse Practitioner

## 2021-06-19 VITALS — BP 108/80 | HR 98 | Temp 98.5°F | Resp 16 | Ht 61.0 in | Wt 90.2 lb

## 2021-06-19 DIAGNOSIS — E538 Deficiency of other specified B group vitamins: Secondary | ICD-10-CM

## 2021-06-19 DIAGNOSIS — R1084 Generalized abdominal pain: Secondary | ICD-10-CM

## 2021-06-19 DIAGNOSIS — N921 Excessive and frequent menstruation with irregular cycle: Secondary | ICD-10-CM

## 2021-06-19 DIAGNOSIS — R197 Diarrhea, unspecified: Secondary | ICD-10-CM

## 2021-06-19 MED ORDER — CYANOCOBALAMIN 1000 MCG/ML IJ SOLN
1000.0000 ug | Freq: Once | INTRAMUSCULAR | Status: AC
  Administered 2021-06-19: 1000 ug via INTRAMUSCULAR

## 2021-06-19 MED ORDER — CYANOCOBALAMIN 1000 MCG/ML IJ SOLN: 1000.0000 ug | Freq: Once | INTRAMUSCULAR | Status: DC

## 2021-06-19 NOTE — Progress Notes (Signed)
[facility] [address] [place], [place] [address]  Internal MEDICINE  Office Visit Note  Patient Name: Mariah Bird  [id number]  [id number]  Date of Service: 06/19/2021  Chief Complaint  Patient presents with   Follow-up    Review tests and [place], tonsillitis started 06-03-2021, polyps in throat, she pulled one out yesterday, tonsils were filled with pus    HPI Jerene presents for a follow up visit to review ultrasound and lab tests. CBC shows macrocytosis (elevated MCV of 100). MCH is also elevated. No anemia noted. ANA is positive. RF, ESR and CRP are normal. No other significant abnormals noted. Abdominal ultrasound was normal, no acute abnormalities were found.  She gets recurrent tonsilitis. She also reports having tonsil stones. She took a polyp off one of her tonsils. She wants to get a tonsillectomy when she has insurance.  Irregular periods - lasted a month, large clots     Current Medication: Outpatient Encounter Medications as of 06/19/2021  Medication Sig Note   cyclobenzaprine (FLEXERIL) 10 MG tablet  02/02/2017: PRN   diphenoxylate-atropine (LOMOTIL) 2.5-0.025 MG tablet Take 1 tablet by mouth 4 (four) times daily as needed for diarrhea or loose stools.    escitalopram (LEXAPRO) 20 MG tablet Take 1 tablet (20 mg total) by mouth daily.    hydrOXYzine (VISTARIL) 25 MG capsule Take 1 capsule (25 mg total) by mouth at bedtime as needed.    ondansetron (ZOFRAN ODT) 4 MG disintegrating tablet 4mg  ODT q4 hours prn nausea/vomit    pantoprazole (PROTONIX) 20 MG tablet Take 1 tablet (20 mg total) by mouth daily.    traZODone (DESYREL) 50 MG tablet Take 0.5-1 tablets (25-50 mg total) by mouth at bedtime as needed for sleep.    Vitamin D, Ergocalciferol, (DRISDOL) 1.25 MG (50000 UNIT) CAPS capsule Take 1 capsule (50,000 Units total) by mouth every 7 (seven) days.    [EXPIRED] cyanocobalamin ((VITAMIN B-12)) injection 1,000 mcg     [DISCONTINUED] cyanocobalamin  ((VITAMIN B-12)) injection 1,000 mcg     No facility-administered encounter medications on file as of 06/19/2021.    Surgical History: Past Surgical History:  Procedure Laterality Date   KNEE ARTHROSCOPY     NASAL SINUS SURGERY  12/18/2016    Medical History: Past Medical History:  Diagnosis Date   Anxiety    Depression    Nephrolithiasis     Family History: Family History  Problem Relation Age of Onset   Kidney Stones Father    Kidney Stones Paternal Grandmother    Kidney disease Maternal Uncle    Bladder Cancer Neg Hx    Kidney cancer Neg Hx    Prostate cancer Neg Hx     Social History   Socioeconomic History   Marital status: Single    Spouse name: Not on file   Number of children: Not on file   Years of education: Not on file   Highest education level: Not on file  Occupational History   Not on file  Tobacco Use   Smoking status: Never   Smokeless tobacco: Never  Vaping Use   Vaping Use: Never used  Substance and Sexual Activity   Alcohol use: Yes    Alcohol/week: 7.0 standard drinks    Types: 7 Glasses of wine per week    Comment: about 1-2 glass every other day   Drug use: No   Sexual activity: Not on file  Other Topics Concern   Not on file  Social History Narrative  Not on file   Social Determinants of Health   Financial Resource Strain: Not on file  Food Insecurity: Not on file  Transportation Needs: Not on file  Physical Activity: Not on file  Stress: Not on file  Social Connections: Not on file  Intimate Partner Violence: Not on file      Review of Systems  Constitutional:  Positive for chills and fatigue. Negative for fever.  HENT:  Negative for nosebleeds, sore throat and trouble swallowing.   Eyes: Negative.   Respiratory:  Positive for cough. Negative for chest tightness, shortness of breath and wheezing.   Cardiovascular: Negative.  Negative for chest pain and palpitations.  Gastrointestinal:  Positive for abdominal pain,  constipation, diarrhea, nausea and vomiting. Negative for abdominal distention, anal bleeding, blood in stool and rectal pain.  Endocrine: Positive for cold intolerance and heat intolerance.  Genitourinary: Negative.   Musculoskeletal:  Positive for arthralgias and joint swelling.  Skin: Negative.  Negative for rash.  Neurological:  Positive for weakness. Negative for dizziness, light-headedness and headaches.  Hematological:  Bruises/bleeds easily.  Psychiatric/Behavioral:  Negative for self-injury and suicidal ideas.    Vital Signs: BP 108/80   Pulse 98   Temp 98.5 F (36.9 C)   Resp 16   Ht $R'5\' 1"'fF$  (1.549 m)   Wt 90 lb 3.2 oz (40.9 kg)   SpO2 97%   BMI 17.04 kg/m    Physical Exam Vitals reviewed.  Constitutional:      General: She is not in acute distress.    Appearance: Normal appearance. She is well-developed, well-groomed and underweight. She is not ill-appearing.  HENT:     Head: Normocephalic and atraumatic.  Eyes:     Extraocular Movements: Extraocular movements intact.     Pupils: Pupils are equal, round, and reactive to light.  Cardiovascular:     Rate and Rhythm: Normal rate and regular rhythm.     Pulses: Normal pulses.     Heart sounds: Normal heart sounds. No murmur heard. Pulmonary:     Effort: Pulmonary effort is normal. No respiratory distress.     Breath sounds: No wheezing.  Abdominal:     General: Bowel sounds are normal. There is no distension.     Palpations: Abdomen is soft. There is no mass.     Tenderness: There is abdominal tenderness. There is no guarding.     Hernia: No hernia is present.  Neurological:     Mental Status: She is alert and oriented to person, place, and time.     Cranial Nerves: No cranial nerve deficit.     Coordination: Coordination normal.     Gait: Gait normal.  Psychiatric:        Mood and Affect: Mood normal.        Behavior: Behavior normal.       Assessment/Plan: 1. Generalized abdominal pain No cause for  the abdominal pain has been identified from labs or abdominal ultrasound. Needs referral to GI but has no insurance  2. Diarrhea, unspecified type No cause identified yet, will send referral for GI once she has insurance after the first of the year.   3. Menorrhagia with irregular cycle No anemia noted. Patient will call clinic if she has another period with large clots. Wait and watch. No intervention at this time.   4. B12 deficiency Administered in office today - cyanocobalamin ((VITAMIN B-12)) injection 1,000 mcg   General Counseling: Adasha verbalizes understanding of the findings of todays visit and agrees  with plan of treatment. I have discussed any further diagnostic evaluation that may be needed or ordered today. We also reviewed her medications today. she has been encouraged to call the office with any questions or concerns that should arise related to todays visit.    No orders of the defined types were placed in this encounter.   Meds ordered this encounter  Medications   cyanocobalamin ((VITAMIN B-12)) injection 1,000 mcg   DISCONTD: cyanocobalamin ((VITAMIN B-12)) injection 1,000 mcg    Return in about 2 months (around 08/19/2021) for F/U, Ike Maragh PCP.   Total time spent:30 Minutes Time spent includes review of chart, medications, test results, and follow up plan with the patient.   [place] Controlled Substance Database was reviewed by me.  This patient was seen by Jonetta Osgood, FNP-C in collaboration with Dr. Clayborn Bigness as a part of collaborative care agreement.   Porshia Blizzard R. Valetta Fuller, MSN, FNP-C Internal medicine

## 2021-07-14 NOTE — Progress Notes (Signed)
Office Visit Note  Patient: Mariah Bird             Date of Birth: 15-Oct-1985           MRN: [id number]             PCP: Jonetta Osgood, NP Referring: Jonetta Osgood, NP Visit Date: 07/24/2021 Occupation: @GUAROCC @  Subjective:  Pain in multiple joint.   History of Present Illness: Aneesa Romey is a 35 y.o. female seen in consultation per request of her PCP.  According to the patient in fall 2019 she started dental school for dental assistant.  She states she was under a lot of stress and she noticed that she was having decreased appetite and weight loss and irregular periods.  She states that the symptoms got worse over time she started experiencing increased fatigue, muscle pain and joint pain and appetite got worse with weight loss.  She was also experiencing some rashes for which she was given topical agents.  She states her periods became very heavy and prolonged for which she was seen by an OB/GYN and with some medications were given which helped her.  In September 2022 1 night she went for dinner and had some alcoholic beverages that night she woke up with severe chest pain and abdominal pain.  She was taken to the emergency room by the ambulance where she was diagnosed with gastroesophageal reflux.  She was given Zofran and pantoprazole.  She states the symptoms gradually improved.  She was also having diarrhea.  She states the diarrhea persists.  She has been having watery to loose stools about 3 stools per day with mucus and occasional blood.  She denies any abdominal pain now.  She states for the last 1-1/2 years she has been having increased pain and discomfort in multiple joints which she describes in her cervical spine, thoracic spine, lumbar spine, shoulders, hands, hips, knees and her feet.  She states her right foot is more painful than the left foot.  She has noticed occasional swelling in her both knee joints.  She also has occasional pain in her calves.  There is no  family history of autoimmune disease or inflammatory bowel disease.  She is gravida 0 para para 0.  There is no history of DVTs.  Contraception is condoms only.  Activities of Daily Living:  Patient reports morning stiffness for all day. Patient Reports nocturnal pain.  Difficulty dressing/grooming: Reports Difficulty climbing stairs: Reports Difficulty getting out of chair: Reports Difficulty using hands for taps, buttons, cutlery, and/or writing: Denies  Review of Systems  Constitutional:  Positive for fatigue and weight loss.  HENT:  Positive for mouth dryness and nose dryness. Negative for mouth sores.   Eyes:  Positive for itching. Negative for pain and dryness.  Respiratory:  Negative for shortness of breath and difficulty breathing.   Cardiovascular:  Negative for chest pain and palpitations.  Gastrointestinal:  Positive for constipation and diarrhea. Negative for blood in stool.  Endocrine: Negative for increased urination.  Genitourinary:  Negative for difficulty urinating.  Musculoskeletal:  Positive for joint pain, joint pain, joint swelling, myalgias, morning stiffness, muscle tenderness and myalgias.  Skin:  Positive for rash. Negative for color change and sensitivity to sunlight.  Allergic/Immunologic: Negative for susceptible to infections.  Neurological:  Positive for weakness.  Hematological:  Positive for bruising/bleeding tendency. Negative for swollen glands.  Psychiatric/Behavioral:  Positive for depressed mood and sleep disturbance. The patient is nervous/anxious.    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   Objective: Vital Signs: BP 105/73 (BP Location: Right Arm, Patient Position: Sitting, Cuff Size: Normal)   Pulse 71   Ht 5' (1.524 m)   Wt 93 lb 3.2 oz (42.3 kg)   BMI 18.20 kg/m    Physical Exam Vitals and nursing note reviewed.  Constitutional:      Appearance: She is well-developed.  HENT:     Head: Normocephalic and atraumatic.  Eyes:     Conjunctiva/sclera: Conjunctivae normal.  Cardiovascular:     Rate and Rhythm: Normal rate and regular rhythm.     Heart sounds: Normal heart sounds.  Pulmonary:     Effort: Pulmonary effort is normal.     Breath sounds: Normal breath sounds.  Abdominal:     General: Bowel sounds are normal.     Palpations: Abdomen is soft.  Musculoskeletal:     Cervical back: Normal range of motion.  Lymphadenopathy:     Cervical: No cervical adenopathy.  Skin:    General: Skin is warm and dry.     Capillary Refill: Capillary refill takes less than 2 seconds.  Neurological:     Mental Status: She is alert and oriented to person, place, and time.  Psychiatric:        Behavior: Behavior normal.     Musculoskeletal Exam: C-spine thoracic and lumbar spine were in good range of motion.  Shoulder joints, elbow joints, wrist joints, MCPs PIPs and DIPs with good range of motion with no synovitis.  Hip joints, knee joints, ankles, MTPs and PIPs and DIPs with good range of motion with no synovitis.  There was no evidence of plantar fasciitis or Achilles tendinitis.  No muscle weakness or tenderness was  noted.  CDAI Exam: CDAI Score: -- Patient Global: --; Provider Global: -- Swollen: --; Tender: -- Joint Exam 07/24/2021   No joint exam has been documented for this visit   There is currently no information documented on the homunculus. Go to the Rheumatology activity and complete the homunculus joint exam.  Investigation: No additional findings.  Imaging: No results found.  Recent Labs: Lab Results  Component Value Date   WBC 5.9 05/03/2021   HGB 13.5 05/03/2021   PLT 263 05/03/2021   NA 135 05/03/2021   K 3.5 05/03/2021   CL 105 05/03/2021   CO2 22 05/03/2021   GLUCOSE 114 (H) 05/03/2021   BUN 10 05/03/2021   CREATININE 0.43 (L) 05/03/2021   BILITOT 0.7 05/03/2021   ALKPHOS 57 05/03/2021  AST 19 05/03/2021   ALT 22 05/03/2021   PROT 8.3 (H) 05/03/2021   ALBUMIN 4.8 05/03/2021   CALCIUM 9.8 05/03/2021   GFRAA 137 07/18/2020   QFTBGOLDPLUS Negative 03/08/2018    May 14, 2021 ESR 2, C-reactive protein<1, RF negative, ANA positive, B12 normal, folate normal  July 18, 2020 vitamin D14.5  Speciality Comments: No specialty comments available.  Procedures:  No procedures performed Allergies: Patient has no known allergies.   Assessment / Plan:     Visit Diagnoses: Polyarthralgia-patient gives history of pain and discomfort in almost all of her joints.  She states she has discomfort in her entire spine and all of her joints.  She gives history of swelling in her joints.  All her joints are in full range of motion without any warmth swelling or effusion.  There was no synovitis on my examination.  Her labs from September 2022 showed normal sedimentation rate and C-reactive protein.  Rheumatoid factor was negative.  Patient does not have insurance.  I would avoid obtaining x-rays today.  Positive ANA-the ANA was positive in September 2022.  She gives history of fatigue, weight loss and generalized pain.  I obtain AVISE labs.  Chronic pain of right knee -  Patient had arthroscopic surgery on her right knee joint at age 45.  She gives history of chronic right knee joint pain since she had arthroscopic surgery.  Myalgia-she has history of generalized muscle pain and achiness.  She had good muscle strength and had no difficulty getting up from the squatting position.  Weight loss-she gives history of progressive weight loss since 2019.  She is unable to gain weight.  She was hospitalized in September with severe abdominal pain and was treated for reflux symptoms.  She states she does not have reflux symptoms now.  Chronic diarrhea-she gives history of chronic diarrhea for the last 3 to 4 years which is progressively getting worse.  She states now she has been having 2-3 loose stools every day which could be watery to soft stools.  She also gives history of occasional blood in her stool.  She notices mucus in her stools.  Patient states that her PCP is referring her to gastroenterology for evaluation.  Vitamin D deficiency-she has vitamin D deficiency.  She has been taking vitamin D 50,000 units/week.  Her B12 and folate levels were normal.  Other fatigue-she gives history of fatigue for the last few years.  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  Anxiety and depression - She is on Lexapro.  Primary insomnia-she gives history of chronic insomnia.  Orders: No orders of the defined types were placed in this encounter.  No orders of the defined types were placed in this encounter.   Face-to-face time spent with patient was 45 minutes. Greater than 50% of time was spent in counseling and coordination of care.  Follow-Up Instructions: Return for Polyarthralgia, positive ANA.   Bo Merino, MD  Note - This record has been created using Editor, commissioning.  Chart creation errors have been sought, but may not always  have been located. Such creation errors do not reflect on   the standard of medical care.

## 2021-07-17 ENCOUNTER — Encounter: Payer: No Typology Code available for payment source | Admitting: Physician Assistant

## 2021-07-20 ENCOUNTER — Encounter: Payer: Self-pay | Admitting: Nurse Practitioner

## 2021-07-21 ENCOUNTER — Ambulatory Visit: Payer: Self-pay

## 2021-07-24 ENCOUNTER — Encounter: Payer: Self-pay | Admitting: Rheumatology

## 2021-07-24 ENCOUNTER — Ambulatory Visit (INDEPENDENT_AMBULATORY_CARE_PROVIDER_SITE_OTHER): Payer: Self-pay | Admitting: Rheumatology

## 2021-07-24 ENCOUNTER — Other Ambulatory Visit: Payer: Self-pay

## 2021-07-24 VITALS — BP 105/73 | HR 71 | Ht 60.0 in | Wt 93.2 lb

## 2021-07-24 DIAGNOSIS — R768 Other specified abnormal immunological findings in serum: Secondary | ICD-10-CM

## 2021-07-24 DIAGNOSIS — K529 Noninfective gastroenteritis and colitis, unspecified: Secondary | ICD-10-CM

## 2021-07-24 DIAGNOSIS — F419 Anxiety disorder, unspecified: Secondary | ICD-10-CM

## 2021-07-24 DIAGNOSIS — M25561 Pain in right knee: Secondary | ICD-10-CM

## 2021-07-24 DIAGNOSIS — R61 Generalized hyperhidrosis: Secondary | ICD-10-CM

## 2021-07-24 DIAGNOSIS — F5101 Primary insomnia: Secondary | ICD-10-CM

## 2021-07-24 DIAGNOSIS — R5383 Other fatigue: Secondary | ICD-10-CM

## 2021-07-24 DIAGNOSIS — E559 Vitamin D deficiency, unspecified: Secondary | ICD-10-CM

## 2021-07-24 DIAGNOSIS — G8929 Other chronic pain: Secondary | ICD-10-CM

## 2021-07-24 DIAGNOSIS — R634 Abnormal weight loss: Secondary | ICD-10-CM

## 2021-07-24 DIAGNOSIS — M791 Myalgia, unspecified site: Secondary | ICD-10-CM

## 2021-07-24 DIAGNOSIS — M2550 Pain in unspecified joint: Secondary | ICD-10-CM

## 2021-07-24 DIAGNOSIS — F32A Depression, unspecified: Secondary | ICD-10-CM

## 2021-08-08 NOTE — Progress Notes (Deleted)
Office Visit Note  Patient: Mariah Bird             Date of Birth: 06-29-1986           MRN: [id number]             PCP: Jonetta Osgood, NP Referring: Jonetta Osgood, NP Visit Date: 08/19/2021 Occupation: @GUAROCC @  Subjective:  No chief complaint on file.   History of Present Illness: Mariah Bird is a 35 y.o. female ***   Activities of Daily Living:  Patient reports morning stiffness for *** {minute/hour:19697}.   Patient {ACTIONS;DENIES/REPORTS:21021675::"Denies"} nocturnal pain.  Difficulty dressing/grooming: {ACTIONS;DENIES/REPORTS:21021675::"Denies"} Difficulty climbing stairs: {ACTIONS;DENIES/REPORTS:21021675::"Denies"} Difficulty getting out of chair: {ACTIONS;DENIES/REPORTS:21021675::"Denies"} Difficulty using hands for taps, buttons, cutlery, and/or writing: {ACTIONS;DENIES/REPORTS:21021675::"Denies"}  No Rheumatology ROS completed.   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   Objective: Vital Signs: There were no vitals taken for this visit.   Physical Exam   Musculoskeletal Exam: ***  CDAI Exam: CDAI Score: -- Patient Global: --;  Provider Global: -- Swollen: --; Tender: -- Joint Exam 08/19/2021   No joint exam has been documented for this visit   There is currently no information documented on the homunculus. Go to the Rheumatology activity and complete the homunculus joint exam.  Investigation: No additional findings.  Imaging: No results found.  Recent Labs: Lab Results  Component Value Date   WBC 5.9 05/03/2021   HGB 13.5 05/03/2021   PLT 263 05/03/2021   NA 135 05/03/2021   K 3.5 05/03/2021   CL 105 05/03/2021   CO2 22 05/03/2021   GLUCOSE 114 (H) 05/03/2021   BUN 10 05/03/2021   CREATININE 0.43 (L) 05/03/2021   BILITOT 0.7 05/03/2021   ALKPHOS 57 05/03/2021   AST 19 05/03/2021   ALT 22 05/03/2021   PROT 8.3 (H) 05/03/2021   ALBUMIN 4.8 05/03/2021   CALCIUM 9.8 05/03/2021   GFRAA 137 07/18/2020   QFTBGOLDPLUS Negative 03/08/2018      Speciality Comments: No specialty comments available.  Procedures:  No procedures performed Allergies: Patient has no known allergies.   Assessment / Plan:     Visit Diagnoses: Positive ANA (antinuclear antibody)  Polyarthralgia  Chronic pain of right knee  Myalgia  Weight loss  Chronic diarrhea  Vitamin D deficiency  Other fatigue  Primary insomnia  Hyperhidrosis  Anxiety and depression  Orders: No orders of the defined types were placed in this encounter.  No orders of the defined types were placed in this encounter.   Face-to-face time spent with  patient was *** minutes. Greater than 50% of time was spent in counseling and coordination of care.  Follow-Up Instructions: No follow-ups on file.   Bo Merino, MD  Note - This record has been created using Editor, commissioning.  Chart creation errors have been sought, but may not always  have been located. Such creation errors do not reflect on  the standard of medical care.

## 2021-08-13 ENCOUNTER — Telehealth: Payer: Self-pay | Admitting: *Deleted

## 2021-08-13 NOTE — Telephone Encounter (Signed)
LMOM, previously drawn AVISE lab specimen was unable to be delivered to lab due to weather, labs need to be redrawn, form at front desk for patient to pick up

## 2021-08-14 ENCOUNTER — Encounter: Payer: Self-pay | Admitting: Nurse Practitioner

## 2021-08-14 DIAGNOSIS — R1084 Generalized abdominal pain: Secondary | ICD-10-CM

## 2021-08-14 DIAGNOSIS — R197 Diarrhea, unspecified: Secondary | ICD-10-CM

## 2021-08-19 ENCOUNTER — Ambulatory Visit: Payer: Self-pay | Admitting: Rheumatology

## 2021-08-19 ENCOUNTER — Telehealth: Payer: Self-pay

## 2021-08-19 NOTE — Telephone Encounter (Signed)
Lvm for patient to return my call to see if she now has health coverage before I fax GI referral-Toni

## 2021-08-20 ENCOUNTER — Ambulatory Visit: Payer: Self-pay | Admitting: Nurse Practitioner

## 2021-08-20 NOTE — Telephone Encounter (Signed)
GI referral manually faxed to [facility] [phone]

## 2021-08-22 NOTE — Progress Notes (Signed)
Office Visit Note  Patient: Mariah Bird             Date of Birth: Aug 22, 1985           MRN: [id number]             PCP: Jonetta Osgood, NP Referring: Jonetta Osgood, NP Visit Date: 09/04/2021 Occupation: @GUAROCC @  Subjective:  Pain in joints and muscles.   History of Present Illness: Mariah Bird is a 36 y.o. female was recently evaluated for arthralgias and myalgias.  She denies any history of joint swelling but she continues to have some stiffness in her joints.  She has some discomfort in her hands and her right knee.  She has not seen the gastroenterologist yet.  She continues to have chronic diarrhea.  She complains of numbness in her hands especially in the morning.  She states that she is not on the computer but she is on the cell phone quite a lot.  Activities of Daily Living:  Patient reports morning stiffness for 30 minutes.   Patient Reports nocturnal pain.  Difficulty dressing/grooming: Denies Difficulty climbing stairs: Reports Difficulty getting out of chair: Denies Difficulty using hands for taps, buttons, cutlery, and/or writing: Denies  Review of Systems  Constitutional:  Positive for fatigue.  HENT:  Negative for mouth sores, mouth dryness and nose dryness.   Eyes:  Negative for pain, itching and dryness.  Respiratory:  Negative for shortness of breath and difficulty breathing.   Cardiovascular:  Negative for chest pain and palpitations.  Gastrointestinal:  Positive for diarrhea. Negative for blood in stool and constipation.  Endocrine: Negative for increased urination.  Genitourinary:  Negative for difficulty urinating.  Musculoskeletal:  Positive for joint pain, joint pain, joint swelling, myalgias, morning stiffness, muscle tenderness and myalgias.  Skin:  Negative for rash and redness.  Allergic/Immunologic: Negative for susceptible to infections.  Neurological:  Positive for dizziness, numbness, headaches, parasthesias and weakness.   Hematological:  Positive for bruising/bleeding tendency.  Psychiatric/Behavioral:  Positive for depressed mood and sleep disturbance. The patient is nervous/anxious.    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   Objective: Vital Signs: BP 108/77 (BP Location: Left Arm, Patient Position: Sitting, Cuff Size: Normal)    Pulse 82    Ht 5\' 1"  (1.549 m)    Wt 94 lb 9.6 oz (42.9 kg)    BMI 17.87 kg/m    Physical Exam Vitals and nursing note reviewed.  Constitutional:      Appearance: She is well-developed.  HENT:  Head: Normocephalic and atraumatic.  Eyes:     Conjunctiva/sclera: Conjunctivae normal.  Cardiovascular:     Rate and Rhythm: Normal rate and regular rhythm.     Heart sounds: Normal heart sounds.  Pulmonary:     Effort: Pulmonary effort is normal.     Breath sounds: Normal breath sounds.  Abdominal:     General: Bowel sounds are normal.     Palpations: Abdomen is soft.  Musculoskeletal:     Cervical back: Normal range of motion.  Lymphadenopathy:     Cervical: No cervical adenopathy.  Skin:    General: Skin is warm and dry.     Capillary Refill: Capillary  refill takes less than 2 seconds.  Neurological:     Mental Status: She is alert and oriented to person, place, and time.  Psychiatric:        Behavior: Behavior normal.     Musculoskeletal Exam: C-spine thoracic and lumbar spine with good range of motion.  She had no tenderness on the palpation.  Shoulder joints, elbow joints, wrist joints, MCPs PIPs and DIPs with good range of motion with no synovitis.  Hip joints, knee joints, ankles, MTPs and PIPs with good range of motion with no synovitis.  She had no tender points.  No hyperalgesia was noted.  CDAI Exam: CDAI Score: -- Patient Global: --; Provider Global: -- Swollen: --; Tender: -- Joint Exam 09/04/2021   No joint exam has been documented for this visit   There is currently no information documented on the homunculus. Go to the Rheumatology activity and complete the homunculus joint exam.  Investigation: No additional findings.  Imaging: No results found.  Recent Labs: Lab Results  Component Value Date   WBC 5.9 05/03/2021   HGB 13.5 05/03/2021   PLT 263 05/03/2021   NA 135 05/03/2021   K 3.5 05/03/2021   CL 105 05/03/2021   CO2 22 05/03/2021   GLUCOSE 114 (H) 05/03/2021   BUN 10 05/03/2021   CREATININE 0.43 (L) 05/03/2021   BILITOT 0.7 05/03/2021   ALKPHOS 57 05/03/2021   AST 19 05/03/2021   ALT 22 05/03/2021   PROT 8.3 (H) 05/03/2021   ALBUMIN 4.8 05/03/2021   CALCIUM 9.8 05/03/2021   GFRAA 137 07/18/2020   QFTBGOLDPLUS Negative 03/08/2018     Speciality Comments: No specialty comments available.  Procedures:  No procedures performed Allergies: Patient has no known allergies.   Assessment / Plan:     Visit Diagnoses: Polyarthralgia - History of pain and discomfort in multiple joints.  No synovitis was noted.  Positive ANA (antinuclear antibody) - December 28, 2022[facility] lupus index -2.7, ANA negative, ENA negative, Jo 1 negative, CB CAP negative, anticardiolipin negative, beta-2 GP 1 negative,  antiphosphatidylserine negative, antihistone weak positive, anticarpi negative, antithyroglobulin antibody equivocal, anti-TPO antibody positive.  Lab findings were discussed with the patient.  She has no clinical features of autoimmune disease and all the autoimmune work-up is negative.  I advised her to contact me in case she develops any new symptoms.  Chronic pain of right knee - She had arthroscopic surgery at age 55 and has chronic discomfort in her knee joint.  No warmth swelling or effusion was noted.  Lower extremity muscle strength exercises were discussed.  Paresthesia of both hands-she is on her cell phone quite a lot.  She has tingling in her hands especially in the morning.  I offered a prescription for carpal tunnel brace.  She states she has the braces at home.  She will try those for now.  Myalgia - History of generalized muscle pain and achiness.  We discussed possibility of myofascial pain.  She also have some hypermobility in her joints.  She had no tender points on my examination.  Weight loss - History of progressive weight loss since 2019.  Chronic diarrhea - History of diarrhea of for the last 3 to 4 years.  Her appointment with the gastroenterologist is pending.  Vitamin D deficiency - She is on vitamin D supplement.  Other fatigue-she continues to have chronic fatigue.  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  Anxiety and depression  Primary insomnia  Orders: No orders of the defined types were placed in this encounter.  No orders of the defined types were placed in this encounter.   Follow-Up Instructions: Return if symptoms worsen or fail to improve, for Arthralgias and myalgias.   Bo Merino, MD  Note - This record has been created using Editor, commissioning.  Chart creation errors have been sought, but may not always  have been located. Such creation errors do not reflect on  the standard of medical care.

## 2021-09-02 NOTE — Telephone Encounter (Signed)
GI appointment >> 09/29/21 @ 1:30 w/ Michele Rockers

## 2021-09-04 ENCOUNTER — Other Ambulatory Visit: Payer: Self-pay

## 2021-09-04 ENCOUNTER — Telehealth: Payer: Self-pay

## 2021-09-04 ENCOUNTER — Ambulatory Visit (INDEPENDENT_AMBULATORY_CARE_PROVIDER_SITE_OTHER): Payer: Self-pay | Admitting: Rheumatology

## 2021-09-04 ENCOUNTER — Encounter: Payer: Self-pay | Admitting: Rheumatology

## 2021-09-04 VITALS — BP 108/77 | HR 82 | Ht 61.0 in | Wt 94.6 lb

## 2021-09-04 DIAGNOSIS — R5383 Other fatigue: Secondary | ICD-10-CM

## 2021-09-04 DIAGNOSIS — G8929 Other chronic pain: Secondary | ICD-10-CM

## 2021-09-04 DIAGNOSIS — F32A Depression, unspecified: Secondary | ICD-10-CM

## 2021-09-04 DIAGNOSIS — M791 Myalgia, unspecified site: Secondary | ICD-10-CM

## 2021-09-04 DIAGNOSIS — R61 Generalized hyperhidrosis: Secondary | ICD-10-CM

## 2021-09-04 DIAGNOSIS — E559 Vitamin D deficiency, unspecified: Secondary | ICD-10-CM

## 2021-09-04 DIAGNOSIS — R634 Abnormal weight loss: Secondary | ICD-10-CM

## 2021-09-04 DIAGNOSIS — F419 Anxiety disorder, unspecified: Secondary | ICD-10-CM

## 2021-09-04 DIAGNOSIS — M2550 Pain in unspecified joint: Secondary | ICD-10-CM

## 2021-09-04 DIAGNOSIS — F5101 Primary insomnia: Secondary | ICD-10-CM

## 2021-09-04 DIAGNOSIS — K529 Noninfective gastroenteritis and colitis, unspecified: Secondary | ICD-10-CM

## 2021-09-04 DIAGNOSIS — M25561 Pain in right knee: Secondary | ICD-10-CM

## 2021-09-04 DIAGNOSIS — R202 Paresthesia of skin: Secondary | ICD-10-CM

## 2021-09-04 DIAGNOSIS — R768 Other specified abnormal immunological findings in serum: Secondary | ICD-10-CM

## 2021-09-04 NOTE — Telephone Encounter (Signed)
Patient states she received a bill from [facility] for $150 for the [place] labs. I called our [facility] representative and was then advised to call [facility] directly. I was advised that due to the patient being self-pay, her co-pay is $150. I called patient to explain and patient was very understanding and okay to pay the bill. She will call to pay it.

## 2021-10-02 ENCOUNTER — Telehealth: Payer: Self-pay

## 2021-10-02 NOTE — Telephone Encounter (Signed)
Left vm and sent mychart message to confirm 10/06/21 appointment-Toni

## 2021-10-06 ENCOUNTER — Encounter: Payer: BLUE CROSS/BLUE SHIELD | Admitting: Physician Assistant

## 2021-11-13 ENCOUNTER — Telehealth: Payer: Self-pay

## 2021-11-13 NOTE — Telephone Encounter (Signed)
Left vm and sent mychart message to confirm 11/20/21 appointment-Toni ?

## 2021-11-20 ENCOUNTER — Encounter: Payer: Self-pay | Admitting: Nurse Practitioner

## 2021-11-20 ENCOUNTER — Ambulatory Visit: Payer: BLUE CROSS/BLUE SHIELD | Admitting: Nurse Practitioner

## 2021-11-20 VITALS — BP 108/78 | HR 68 | Temp 98.7°F | Resp 16 | Ht 61.0 in | Wt 93.8 lb

## 2021-11-20 DIAGNOSIS — E559 Vitamin D deficiency, unspecified: Secondary | ICD-10-CM

## 2021-11-20 DIAGNOSIS — R197 Diarrhea, unspecified: Secondary | ICD-10-CM

## 2021-11-20 DIAGNOSIS — R300 Dysuria: Secondary | ICD-10-CM

## 2021-11-20 DIAGNOSIS — R630 Anorexia: Secondary | ICD-10-CM

## 2021-11-20 DIAGNOSIS — E782 Mixed hyperlipidemia: Secondary | ICD-10-CM

## 2021-11-20 DIAGNOSIS — G4700 Insomnia, unspecified: Secondary | ICD-10-CM

## 2021-11-20 DIAGNOSIS — E049 Nontoxic goiter, unspecified: Secondary | ICD-10-CM

## 2021-11-20 DIAGNOSIS — R5383 Other fatigue: Secondary | ICD-10-CM

## 2021-11-20 DIAGNOSIS — R7989 Other specified abnormal findings of blood chemistry: Secondary | ICD-10-CM

## 2021-11-20 DIAGNOSIS — F411 Generalized anxiety disorder: Secondary | ICD-10-CM

## 2021-11-20 DIAGNOSIS — Z0001 Encounter for general adult medical examination with abnormal findings: Secondary | ICD-10-CM

## 2021-11-20 DIAGNOSIS — E538 Deficiency of other specified B group vitamins: Secondary | ICD-10-CM

## 2021-11-20 DIAGNOSIS — R634 Abnormal weight loss: Secondary | ICD-10-CM

## 2021-11-20 MED ORDER — DIPHENOXYLATE-ATROPINE 2.5-0.025 MG PO TABS: 1.0000 | ORAL_TABLET | Freq: Four times a day (QID) | ORAL | 0 refills | Status: AC | PRN

## 2021-11-20 MED ORDER — VITAMIN D (ERGOCALCIFEROL) 1.25 MG (50000 UNIT) PO CAPS: 50000.0000 [IU] | ORAL_CAPSULE | ORAL | 1 refills | Status: DC

## 2021-11-20 MED ORDER — TRAZODONE HCL 50 MG PO TABS: 25.0000 mg | ORAL_TABLET | Freq: Every evening | ORAL | 1 refills | Status: DC | PRN

## 2021-11-20 MED ORDER — CITALOPRAM HYDROBROMIDE 20 MG PO TABS: 20.0000 mg | ORAL_TABLET | Freq: Every day | ORAL | 3 refills | Status: DC

## 2021-11-20 MED ORDER — ONDANSETRON 4 MG PO TBDP: ORAL_TABLET | ORAL | 0 refills | Status: DC

## 2021-11-20 MED ORDER — MEGESTROL ACETATE 40 MG PO TABS: 80.0000 mg | ORAL_TABLET | Freq: Two times a day (BID) | ORAL | 5 refills | Status: DC

## 2021-11-20 NOTE — Progress Notes (Signed)
[facility] ?[address] ?[place], [place] [address] ? ?Internal MEDICINE  ?Office Visit Note ? ?Patient Name: Mariah Bird ? [id number]  ?[id number] ? ?Date of Service: 11/20/2021 ? ?Chief Complaint  ?Patient presents with  ? Annual Exam  ?  Referral to endo   ? ? ?HPI ?Mariah Bird presents for an annual well visit and physical exam. 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 She is being followed by rheumatology and a large panel of labs were recently done. Her Thyroid peroxidase antibody was elevated but she has not had other thyroid labs drawn. She reports a history of enlarged thyroid. She also reports that she is still experiencing hair loss.  ?She is requesting medication to increase her appetite because she cannot gain weight and is underweight. Additional labs will most likely need to be drawn. ?She is starting a new job and will have insurance starting in may.  ?She is not due for any preventive screenings at this time. Weight and BMI are low. Blood pressure is normal, all other vitals are normal.  ?She is not due for pap smear and has declined a clinical breast exam.  ? ? ?Current Medication: ?Outpatient Encounter Medications as of 11/20/2021  ?Medication Sig Note  ? citalopram (CELEXA) 20 MG tablet Take 1 tablet (20 mg total) by mouth daily.   ? megestrol (MEGACE) 40 MG tablet Take 2 tablets (80 mg total) by mouth 2 (two) times daily.   ? [DISCONTINUED] diphenoxylate-atropine (LOMOTIL) 2.5-0.025 MG tablet Take 1 tablet by mouth 4 (four) times daily as needed for diarrhea or loose stools.   ? [DISCONTINUED] escitalopram (LEXAPRO) 20 MG tablet Take 1 tablet (20 mg total) by mouth daily.   ? [DISCONTINUED] hydrOXYzine (VISTARIL) 25 MG capsule Take 1 capsule (25 mg total) by mouth at bedtime as needed.   ? [DISCONTINUED] ondansetron (ZOFRAN ODT) 4 MG disintegrating tablet '4mg'$  ODT q4 hours prn nausea/vomit   ?  [DISCONTINUED] Vitamin D, Ergocalciferol, (DRISDOL) 1.25 MG (50000 UNIT) CAPS capsule Take 1 capsule (50,000 Units total) by mouth every 7 (seven) days.   ? diphenoxylate-atropine (LOMOTIL) 2.5-0.025 MG tablet Take 1 tablet by mouth 4 (four) times daily as needed for diarrhea or loose stools.   ? ondansetron (ZOFRAN ODT) 4 MG disintegrating tablet '4mg'$  ODT q4 hours prn nausea/vomit   ? traZODone (DESYREL) 50 MG tablet Take 0.5-1 tablets (25-50 mg total) by mouth at bedtime as needed for sleep.   ? Vitamin D, Ergocalciferol, (DRISDOL) 1.25 MG (50000 UNIT) CAPS capsule Take 1 capsule (50,000 Units total) by mouth every 7 (seven) days.   ? [DISCONTINUED] cyclobenzaprine (FLEXERIL) 10 MG tablet  (Patient not taking: Reported on 07/24/2021) 02/02/2017: PRN  ? [DISCONTINUED] pantoprazole (PROTONIX) 20 MG tablet Take 1 tablet (20 mg total) by mouth daily. (Patient not taking: Reported on 07/24/2021)   ? [DISCONTINUED] traZODone (DESYREL) 50 MG tablet Take 0.5-1 tablets (25-50 mg total) by mouth at bedtime as needed for sleep. (Patient not taking: Reported on 09/04/2021)   ? ?No facility-administered encounter medications on file as of 11/20/2021.  ? ? ?Surgical History: ?Past Surgical History:  ?Procedure Laterality Date  ? KNEE ARTHROSCOPY Right   ? NASAL SINUS SURGERY  12/18/2016  ? ? ?Medical History: ?Past Medical History:  ?Diagnosis Date  ? Anxiety   ? Depression   ? Hyperthyroidism   ? Nephrolithiasis   ? ? ?Family History: ?Family History  ?Problem Relation  Age of Onset  ? Lupus Sister   ? Kidney disease Maternal Uncle   ? Kidney Stones Paternal Grandmother   ? Bladder Cancer Neg Hx   ? Kidney cancer Neg Hx   ? Prostate cancer Neg Hx   ? ? ?Social History  ? ?Socioeconomic History  ? Marital status: Single  ?  Spouse name: Not on file  ? Number of children: Not on file  ? Years of education: Not on file  ? Highest education level: Not on file  ?Occupational History  ? Not on file  ?Tobacco Use  ? Smoking status: Never  ?  Smokeless tobacco: Never  ?Vaping Use  ? Vaping Use: Never used  ?Substance and Sexual Activity  ? Alcohol use: Not Currently  ?  Comment: occ  ? Drug use: Yes  ?  Types: Marijuana  ?  Comment: occ  ? Sexual activity: Not on file  ?Other Topics Concern  ? Not on file  ?Social History Narrative  ? Not on file  ? ?Social Determinants of Health  ? ?Financial Resource Strain: Not on file  ?Food Insecurity: Not on file  ?Transportation Needs: Not on file  ?Physical Activity: Not on file  ?Stress: Not on file  ?Social Connections: Not on file  ?Intimate Partner Violence: Not on file  ? ? ? ? ?Review of Systems  ?Constitutional:  Negative for activity change, appetite change, chills, fatigue, fever and unexpected weight change.  ?HENT: Negative.  Negative for congestion, ear pain, rhinorrhea, sore throat and trouble swallowing.   ?Eyes: Negative.   ?Respiratory: Negative.  Negative for cough, chest tightness, shortness of breath and wheezing.   ?Cardiovascular: Negative.  Negative for chest pain and palpitations.  ?Gastrointestinal: Negative.  Negative for abdominal pain, blood in stool, constipation, diarrhea, nausea and vomiting.  ?Endocrine: Negative.   ?Genitourinary: Negative.  Negative for difficulty urinating, dysuria, frequency, hematuria and urgency.  ?Musculoskeletal: Negative.  Negative for arthralgias, back pain, joint swelling, myalgias and neck pain.  ?Skin: Negative.  Negative for rash and wound.  ?Allergic/Immunologic: Negative.  Negative for immunocompromised state.  ?Neurological: Negative.  Negative for dizziness, seizures, numbness and headaches.  ?Hematological: Negative.   ?Psychiatric/Behavioral: Negative.  Negative for behavioral problems, self-injury and suicidal ideas. The patient is not nervous/anxious.   ? ?Vital Signs: ?BP 108/78   Pulse 68   Temp 98.7 ?F (37.1 ?C)   Resp 16   Ht '5\' 1"'$  (1.549 m)   Wt 93 lb 12.8 oz (42.5 kg)   SpO2 97%   BMI 17.72 kg/m?  ? ? ?Physical Exam ?Vitals  reviewed.  ?Constitutional:   ?   General: She is awake. She is not in acute distress. ?   Appearance: Normal appearance. She is well-developed and underweight. She is not ill-appearing or diaphoretic.  ?HENT:  ?   Head: Normocephalic and atraumatic.  ?   Right Ear: Tympanic membrane, ear canal and external ear normal.  ?   Left Ear: Tympanic membrane, ear canal and external ear normal.  ?   Nose: Nose normal. No congestion or rhinorrhea.  ?   Mouth/Throat:  ?   Lips: Pink.  ?   Mouth: Mucous membranes are moist.  ?   Pharynx: Oropharynx is clear. Uvula midline. No oropharyngeal exudate or posterior oropharyngeal erythema.  ?Eyes:  ?   General: Lids are normal. Vision grossly intact. Gaze aligned appropriately. No scleral icterus.    ?   Right eye: No discharge.     ?  Left eye: No discharge.  ?   Extraocular Movements: Extraocular movements intact.  ?   Conjunctiva/sclera: Conjunctivae normal.  ?   Pupils: Pupils are equal, round, and reactive to light.  ?   Funduscopic exam: ?   Right eye: Red reflex present.     ?   Left eye: Red reflex present. ?Neck:  ?   Thyroid: No thyromegaly.  ?   Vascular: No JVD.  ?   Trachea: Trachea and phonation normal. No tracheal deviation.  ?Cardiovascular:  ?   Rate and Rhythm: Normal rate and regular rhythm.  ?   Pulses: Normal pulses.  ?   Heart sounds: Normal heart sounds, S1 normal and S2 normal. No murmur heard. ?  No friction rub. No gallop.  ?Pulmonary:  ?   Effort: Pulmonary effort is normal. No accessory muscle usage or respiratory distress.  ?   Breath sounds: Normal breath sounds and air entry. No stridor. No wheezing or rales.  ?Chest:  ?   Chest wall: No tenderness.  ?   Comments: Declined clinical breast exam ?Abdominal:  ?   General: Bowel sounds are normal. There is no distension.  ?   Palpations: Abdomen is soft. There is no shifting dullness, fluid wave, mass or pulsatile mass.  ?   Tenderness: There is no abdominal tenderness. There is no guarding or rebound.   ?Musculoskeletal:     ?   General: No tenderness or deformity. Normal range of motion.  ?   Cervical back: Normal range of motion and neck supple.  ?   Right lower leg: No edema.  ?   Left lower leg: No edema.  ?Lymphad

## 2021-11-21 LAB — UA/M W/RFLX CULTURE, ROUTINE
Bilirubin, UA: NEGATIVE
Glucose, UA: NEGATIVE
Leukocytes,UA: NEGATIVE
Nitrite, UA: NEGATIVE
RBC, UA: NEGATIVE
Specific Gravity, UA: 1.027 (ref 1.005–1.030)
Urobilinogen, Ur: 1 mg/dL (ref 0.2–1.0)
pH, UA: 6 (ref 5.0–7.5)

## 2021-11-21 LAB — MICROSCOPIC EXAMINATION
Casts: NONE SEEN /lpf
RBC, Urine: NONE SEEN /hpf (ref 0–2)
WBC, UA: NONE SEEN /hpf (ref 0–5)

## 2021-11-24 ENCOUNTER — Encounter: Payer: Self-pay | Admitting: Nurse Practitioner

## 2022-01-19 ENCOUNTER — Other Ambulatory Visit: Payer: 59

## 2022-01-21 ENCOUNTER — Encounter: Payer: Self-pay | Admitting: Nurse Practitioner

## 2022-01-26 ENCOUNTER — Telehealth: Payer: Self-pay

## 2022-01-26 NOTE — Telephone Encounter (Signed)
Left vm and sent mychart message to confirm 02/02/22 appointment-Mariah Bird

## 2022-01-28 LAB — CBC WITH DIFFERENTIAL/PLATELET
Basophils Absolute: 0 10*3/uL (ref 0.0–0.2)
Basos: 1 %
EOS (ABSOLUTE): 0.1 10*3/uL (ref 0.0–0.4)
Eos: 2 %
Hematocrit: 37.8 % (ref 34.0–46.6)
Hemoglobin: 12.9 g/dL (ref 11.1–15.9)
Immature Grans (Abs): 0 10*3/uL (ref 0.0–0.1)
Immature Granulocytes: 0 %
Lymphocytes Absolute: 1.4 10*3/uL (ref 0.7–3.1)
Lymphs: 43 %
MCH: 33 pg (ref 26.6–33.0)
MCHC: 34.1 g/dL (ref 31.5–35.7)
MCV: 97 fL (ref 79–97)
Monocytes Absolute: 0.3 10*3/uL (ref 0.1–0.9)
Monocytes: 10 %
Neutrophils Absolute: 1.5 10*3/uL (ref 1.4–7.0)
Neutrophils: 44 %
Platelets: 237 10*3/uL (ref 150–450)
RBC: 3.91 x10E6/uL (ref 3.77–5.28)
RDW: 12.4 % (ref 11.7–15.4)
WBC: 3.3 10*3/uL — ABNORMAL LOW (ref 3.4–10.8)

## 2022-01-28 LAB — CMP14+EGFR
ALT: 16 IU/L (ref 0–32)
AST: 16 IU/L (ref 0–40)
Albumin/Globulin Ratio: 1.6 (ref 1.2–2.2)
Albumin: 4.6 g/dL (ref 3.8–4.8)
Alkaline Phosphatase: 77 IU/L (ref 44–121)
BUN/Creatinine Ratio: 24 — ABNORMAL HIGH (ref 9–23)
BUN: 16 mg/dL (ref 6–20)
Bilirubin Total: 0.4 mg/dL (ref 0.0–1.2)
CO2: 22 mmol/L (ref 20–29)
Calcium: 9.8 mg/dL (ref 8.7–10.2)
Chloride: 104 mmol/L (ref 96–106)
Creatinine, Ser: 0.66 mg/dL (ref 0.57–1.00)
Globulin, Total: 2.9 g/dL (ref 1.5–4.5)
Glucose: 87 mg/dL (ref 70–99)
Potassium: 4.5 mmol/L (ref 3.5–5.2)
Sodium: 140 mmol/L (ref 134–144)
Total Protein: 7.5 g/dL (ref 6.0–8.5)
eGFR: 118 mL/min/{1.73_m2} (ref >59)

## 2022-01-28 LAB — LIPID PANEL
Chol/HDL Ratio: 2.5 ratio (ref 0.0–4.4)
Cholesterol, Total: 193 mg/dL (ref 100–199)
HDL: 77 mg/dL (ref >39)
LDL Chol Calc (NIH): 107 mg/dL — ABNORMAL HIGH (ref 0–99)
Triglycerides: 49 mg/dL (ref 0–149)
VLDL Cholesterol Cal: 9 mg/dL (ref 5–40)

## 2022-01-28 LAB — T3: T3, Total: 104 ng/dL (ref 71–180)

## 2022-01-28 LAB — B12 AND FOLATE PANEL
Folate: 11.4 ng/mL (ref >3.0)
Vitamin B-12: 489 pg/mL (ref 232–1245)

## 2022-01-28 LAB — T3, FREE: T3, Free: 2.7 pg/mL (ref 2.0–4.4)

## 2022-01-28 LAB — TSH+FREE T4
Free T4: 1.03 ng/dL (ref 0.82–1.77)
TSH: 1.2 u[IU]/mL (ref 0.450–4.500)

## 2022-01-28 LAB — VITAMIN D 25 HYDROXY (VIT D DEFICIENCY, FRACTURES): Vit D, 25-Hydroxy: 45.1 ng/mL (ref 30.0–100.0)

## 2022-01-28 NOTE — Progress Notes (Signed)
I have reviewed the lab results. There are no critically abnormal values requiring immediate intervention but there are some abnormals that will be discussed at the next office visit.  

## 2022-01-29 ENCOUNTER — Ambulatory Visit: Payer: 59 | Admitting: Nurse Practitioner

## 2022-02-02 ENCOUNTER — Ambulatory Visit (INDEPENDENT_AMBULATORY_CARE_PROVIDER_SITE_OTHER): Payer: 59

## 2022-02-02 DIAGNOSIS — E049 Nontoxic goiter, unspecified: Secondary | ICD-10-CM | POA: Diagnosis not present

## 2022-02-12 ENCOUNTER — Ambulatory Visit: Payer: 59 | Admitting: Nurse Practitioner

## 2022-02-12 ENCOUNTER — Encounter: Payer: Self-pay | Admitting: Nurse Practitioner

## 2022-02-12 VITALS — BP 107/70 | HR 62 | Temp 97.7°F | Resp 16 | Ht 61.0 in | Wt 94.6 lb

## 2022-02-12 DIAGNOSIS — R634 Abnormal weight loss: Secondary | ICD-10-CM | POA: Diagnosis not present

## 2022-02-12 DIAGNOSIS — E049 Nontoxic goiter, unspecified: Secondary | ICD-10-CM | POA: Diagnosis not present

## 2022-02-12 DIAGNOSIS — R630 Anorexia: Secondary | ICD-10-CM | POA: Diagnosis not present

## 2022-02-12 MED ORDER — DRONABINOL 5 MG PO CAPS: 5.0000 mg | ORAL_CAPSULE | Freq: Two times a day (BID) | ORAL | 1 refills | Status: DC

## 2022-02-12 NOTE — Progress Notes (Signed)
[facility] [place], [place] [address]  Internal MEDICINE  Office Visit Note  Patient Name: Mariah Bird  [id number]  [id number]  Date of Service: 02/12/2022  Chief Complaint  Patient presents with   Follow-up    Here to review ultrasound    HPI Brianne present for a follow up visit for review of thyroid ultrasound Thyroid ultrasound and most recent thyroid hormone labs were normal.  Tried megestrol to increase appetite and aid in weight gain but did not help.  Wants to try alternative med.  Has tried smoking marijuana in the past which did successfully increase her appetite.    Current Medication: Outpatient Encounter Medications as of 02/12/2022  Medication Sig   citalopram (CELEXA) 20 MG tablet Take 1 tablet (20 mg total) by mouth daily.   diphenoxylate-atropine (LOMOTIL) 2.5-0.025 MG tablet Take 1 tablet by mouth 4 (four) times daily as needed for diarrhea or loose stools.   dronabinol (MARINOL) 5 MG capsule Take 1 capsule (5 mg total) by mouth 2 (two) times daily before lunch and supper. Take 1 hour before each meal (except breakfast)   megestrol (MEGACE) 40 MG tablet Take 2 tablets (80 mg total) by mouth 2 (two) times daily.   ondansetron (ZOFRAN ODT) 4 MG disintegrating tablet '4mg'$  ODT q4 hours prn nausea/vomit   traZODone (DESYREL) 50 MG tablet Take 0.5-1 tablets (25-50 mg total) by mouth at bedtime as needed for sleep.   Vitamin D, Ergocalciferol, (DRISDOL) 1.25 MG (50000 UNIT) CAPS capsule Take 1 capsule (50,000 Units total) by mouth every 7 (seven) days.   No facility-administered encounter medications on file as of 02/12/2022.    Surgical History: Past Surgical History:  Procedure Laterality Date   KNEE ARTHROSCOPY Right    NASAL SINUS SURGERY  12/18/2016    Medical History: Past Medical History:  Diagnosis Date   Anxiety    Depression    Hyperthyroidism    Nephrolithiasis     Family History: Family History  Problem  Relation Age of Onset   Lupus Sister    Kidney disease Maternal Uncle    Kidney Stones Paternal Grandmother    Bladder Cancer Neg Hx    Kidney cancer Neg Hx    Prostate cancer Neg Hx     Social History   Socioeconomic History   Marital status: Single    Spouse name: Not on file   Number of children: Not on file   Years of education: Not on file   Highest education level: Not on file  Occupational History   Not on file  Tobacco Use   Smoking status: Never   Smokeless tobacco: Never  Vaping Use   Vaping Use: Never used  Substance and Sexual Activity   Alcohol use: Not Currently    Comment: occ   Drug use: Yes    Types: Marijuana    Comment: occ   Sexual activity: Not on file  Other Topics Concern   Not on file  Social History Narrative   Not on file   Social Determinants of Health   Financial Resource Strain: Not on file  Food Insecurity: Not on file  Transportation Needs: Not on file  Physical Activity: Not on file  Stress: Not on file  Social Connections: Not on file  Intimate Partner Violence: Not on file      Review of Systems  Constitutional:  Positive for appetite change and unexpected weight change. Negative for activity change, chills, fatigue and fever.  HENT:  Negative.  Negative for congestion, ear pain, rhinorrhea, sore throat and trouble swallowing.   Eyes: Negative.   Respiratory: Negative.  Negative for cough, chest tightness, shortness of breath and wheezing.   Cardiovascular: Negative.  Negative for chest pain and palpitations.  Gastrointestinal: Negative.  Negative for abdominal pain, blood in stool, constipation, diarrhea, nausea and vomiting.  Endocrine: Negative.   Genitourinary: Negative.  Negative for difficulty urinating, dysuria, frequency, hematuria and urgency.  Musculoskeletal: Negative.  Negative for arthralgias, back pain, joint swelling, myalgias and neck pain.  Skin: Negative.  Negative for rash and wound.  Allergic/Immunologic:  Negative.  Negative for immunocompromised state.  Neurological: Negative.  Negative for dizziness, seizures, numbness and headaches.  Hematological: Negative.   Psychiatric/Behavioral: Negative.  Negative for behavioral problems, self-injury and suicidal ideas. The patient is not nervous/anxious.     Vital Signs: BP 107/70   Pulse 62   Temp 97.7 F (36.5 C)   Resp 16   Ht '5\' 1"'$  (1.549 m)   Wt 94 lb 9.6 oz (42.9 kg)   SpO2 98%   BMI 17.87 kg/m    Physical Exam Vitals reviewed.  Constitutional:      Appearance: Normal appearance.  HENT:     Head: Normocephalic and atraumatic.  Eyes:     Pupils: Pupils are equal, round, and reactive to light.  Cardiovascular:     Rate and Rhythm: Normal rate and regular rhythm.  Pulmonary:     Effort: Pulmonary effort is normal. No respiratory distress.  Neurological:     Mental Status: She is alert and oriented to person, place, and time.  Psychiatric:        Mood and Affect: Mood normal.        Behavior: Behavior normal.        Assessment/Plan: 1. Abnormal weight loss Megestrol ineffective, will try dronabinol, probably will need prior auth. If approved, instructed patient to avoid marijuana use since dronabinol is a derivative of marijuana/THC.  - dronabinol (MARINOL) 5 MG capsule; Take 1 capsule (5 mg total) by mouth 2 (two) times daily before lunch and supper. Take 1 hour before each meal (except breakfast)  Dispense: 60 capsule; Refill: 1  2. Decreased appetite See problem #1 - dronabinol (MARINOL) 5 MG capsule; Take 1 capsule (5 mg total) by mouth 2 (two) times daily before lunch and supper. Take 1 hour before each meal (except breakfast)  Dispense: 60 capsule; Refill: 1  3. Goiter, nodular Last thyroid labs were normal, no change in thyroid ultrasound.    General Counseling: Mariah Bird understanding of the findings of todays visit and agrees with plan of treatment. I have discussed any further diagnostic evaluation  that may be needed or ordered today. We also reviewed her medications today. she has been encouraged to call the office with any questions or concerns that should arise related to todays visit.    No orders of the defined types were placed in this encounter.   Meds ordered this encounter  Medications   dronabinol (MARINOL) 5 MG capsule    Sig: Take 1 capsule (5 mg total) by mouth 2 (two) times daily before lunch and supper. Take 1 hour before each meal (except breakfast)    Dispense:  60 capsule    Refill:  1    Return in about 1-2 months (around 04/14/2022) for F/U , eval new med, [place] PCP.   Total time spent:30 Minutes Time spent includes review of chart, medications, test results, and follow up plan  with the patient.   [place] Controlled Substance Database was reviewed by me.  This patient was seen by Jonetta Osgood, FNP-C in collaboration with Dr. Clayborn Bigness as a part of collaborative care agreement.   Ennifer Harston R. Valetta Fuller, MSN, FNP-C Internal medicine

## 2022-04-02 ENCOUNTER — Encounter: Payer: Self-pay | Admitting: Nurse Practitioner

## 2022-04-08 ENCOUNTER — Ambulatory Visit
Admission: RE | Admit: 2022-04-08 | Discharge: 2022-04-08 | Disposition: A | Discharge status: Home / Self Care | Payer: 59 | Source: Ambulatory Visit | Attending: Nurse Practitioner | Admitting: Nurse Practitioner

## 2022-04-08 ENCOUNTER — Other Ambulatory Visit: Payer: Self-pay | Admitting: Nurse Practitioner

## 2022-04-08 ENCOUNTER — Encounter: Payer: Self-pay | Admitting: Nurse Practitioner

## 2022-04-08 ENCOUNTER — Ambulatory Visit: Payer: 59 | Admitting: Nurse Practitioner

## 2022-04-08 VITALS — BP 126/83 | HR 65 | Temp 98.5°F | Resp 16 | Ht 61.0 in | Wt 90.8 lb

## 2022-04-08 DIAGNOSIS — R630 Anorexia: Secondary | ICD-10-CM | POA: Diagnosis not present

## 2022-04-08 DIAGNOSIS — N938 Other specified abnormal uterine and vaginal bleeding: Secondary | ICD-10-CM

## 2022-04-08 DIAGNOSIS — R634 Abnormal weight loss: Secondary | ICD-10-CM | POA: Insufficient documentation

## 2022-04-08 LAB — POCT URINE PREGNANCY: Preg Test, Ur: NEGATIVE

## 2022-04-08 NOTE — Progress Notes (Signed)
[facility] [place], [place] [address]  Internal MEDICINE  Office Visit Note  Patient Name: Mariah Bird  [id number]  [id number]  Date of Service: 04/08/2022  Chief Complaint  Patient presents with   Menstrual Problem    Having irregular cycles - on 8/3, off 8/7 - came back on 8/17 - lingering cycles - concerned about thyroid and Hashimoto's     HPI Mariah Bird presents for an acute sick visit for prolonged, irregular and heavy menstrual cycle with additional symptoms and continued unexplained weight loss.  Abnormal uterine bleeding in female of childbearing age: MP started on 03/19/22 with heavy flow and passing large clots, saturating 3-4 pads per day, flow lightened up on 03/23/22 but did not stop completely until 8/16. LMP started 8/18, 2 days later with the same heavy flow and passing large clots and she is currently still on her cycle today. Additional symptoms include nausea, bloating, abd distention, bloating, fatigue, muscle pain and weakness, back pain and cramping. Denies pelvic pain.  Unexplained easy bruising: bruises spontaneously appearing on legs,denies any injury, or fall. Denies any physical abuse from family, friend or significant other.  Continued unexplained weight loss: continuing to lose weight, lost 4 more lbs, underweight low BMI 17.16, current weight 90 lbs. Has had issues with nausea and decreased appetite but has not intentionally been trying to lose weight.  Decreased/lack of appetite: has not been able to start dronabinol to increase her appetite yet due to cortisone injections she had recently for back pain.   her last pap smear was ASC-US in June 2021 and was UNSAT in may 2021 before that. HPV negative both times. Patient followed up with Dr. Garwin Brothers.   Current Medication:  Outpatient Encounter Medications as of 04/08/2022  Medication Sig   citalopram (CELEXA) 20 MG tablet Take 1 tablet (20 mg total) by mouth daily.    diphenoxylate-atropine (LOMOTIL) 2.5-0.025 MG tablet Take 1 tablet by mouth 4 (four) times daily as needed for diarrhea or loose stools.   dronabinol (MARINOL) 5 MG capsule Take 1 capsule (5 mg total) by mouth 2 (two) times daily before lunch and supper. Take 1 hour before each meal (except breakfast)   megestrol (MEGACE) 40 MG tablet Take 2 tablets (80 mg total) by mouth 2 (two) times daily.   traZODone (DESYREL) 50 MG tablet Take 0.5-1 tablets (25-50 mg total) by mouth at bedtime as needed for sleep.   [DISCONTINUED] ondansetron (ZOFRAN ODT) 4 MG disintegrating tablet '4mg'$  ODT q4 hours prn nausea/vomit   [DISCONTINUED] Vitamin D, Ergocalciferol, (DRISDOL) 1.25 MG (50000 UNIT) CAPS capsule Take 1 capsule (50,000 Units total) by mouth every 7 (seven) days.   No facility-administered encounter medications on file as of 04/08/2022.      Medical History: Past Medical History:  Diagnosis Date   Anxiety    Depression    Hyperthyroidism    Nephrolithiasis      Vital Signs: BP 126/83   Pulse 65   Temp 98.5 F (36.9 C)   Resp 16   Ht '5\' 1"'$  (1.549 m)   Wt 90 lb 12.8 oz (41.2 kg)   SpO2 99%   BMI 17.16 kg/m    Review of Systems  Constitutional:  Positive for appetite change (continued decreased/lack of appetite. has not started dronabinol yet.), fatigue and unexpected weight change (lost 4 more lbs, down to 90 lbs, BMI 17.16). Negative for chills and fever.  HENT: Negative.  Negative for dental problem.   Eyes: Negative.  Respiratory: Negative.  Negative for cough, chest tightness, shortness of breath and wheezing.   Cardiovascular: Negative.  Negative for chest pain and palpitations.  Gastrointestinal:  Positive for abdominal distention, nausea and rectal pain (has an irritated small hemorrhoid).       Increased frequency and variable consistency of stool  Endocrine:       Temp fluctuations, hair loss, weight loss, need to order labs to rule out hormone abnormalities   Genitourinary:  Positive for menstrual problem, pelvic pain and vaginal bleeding. Negative for difficulty urinating, dysuria, enuresis, flank pain, frequency, hematuria and vaginal discharge.  Musculoskeletal:  Positive for myalgias.  Skin: Negative.   Allergic/Immunologic: Negative for immunocompromised state.  Neurological:  Positive for weakness (muscle weakness, body ache).  Hematological:  Bruises/bleeds easily.  Psychiatric/Behavioral:  The patient is nervous/anxious (expected due to current medical problem).     Physical Exam Vitals reviewed.  Constitutional:      General: She is awake. She is not in acute distress.    Appearance: Normal appearance. She is well-groomed and underweight. She is not ill-appearing.  HENT:     Head: Normocephalic and atraumatic.  Neck:     Thyroid: No thyromegaly.  Cardiovascular:     Rate and Rhythm: Normal rate and regular rhythm.  Pulmonary:     Effort: Pulmonary effort is normal. No accessory muscle usage or respiratory distress.  Abdominal:     General: Abdomen is protuberant. Bowel sounds are normal. There is distension. There are no signs of injury.     Palpations: There is no shifting dullness, fluid wave, mass or pulsatile mass.     Tenderness: There is abdominal tenderness in the periumbilical area, suprapubic area and left lower quadrant. There is guarding.     Hernia: No hernia is present.  Musculoskeletal:     Cervical back: Normal range of motion and neck supple.     Right lower leg: No edema.     Left lower leg: No edema.  Skin:    Findings: Bruising (scattered on lower extremities) present.  Neurological:     General: No focal deficit present.     Mental Status: She is alert.     Coordination: Coordination is intact.     Gait: Gait is intact.  Psychiatric:        Behavior: Behavior is cooperative.       Assessment/Plan: 1. Dysfunctional uterine bleeding Negative urine pregnancy test.  Additional blood tests ordered  -- patient will go to [facility] -- rule out anemia, bleeding/clotting disorders, STDs, and hormonal abnormalities.  - POCT urine pregnancy - INR/PT - Iron, TIBC and Ferritin Panel - Von Willebrand panel - FSH/LH - Estradiol - CBC with Differential/Platelet - TSH + free T4 - Basic Metabolic Panel (BMET) - hCG, serum, qualitative - Chlamydia/Gonococcus/Trichomonas, NAA - HIV antibody (with reflex) - Coag Studies Interp Report  2. Abnormal weight loss Several lab tests ordered to rule out multiple possible causes of unintentional weight loss - POCT urine pregnancy - INR/PT - Iron, TIBC and Ferritin Panel - Von Willebrand panel - FSH/LH - Estradiol - CBC with Differential/Platelet - TSH + free T4 - Basic Metabolic Panel (BMET) - hCG, serum, qualitative - HIV antibody (with reflex)  3. Decreased appetite Workup is aligned with problem #2 and the decreased appetite and abnormal weight loss has been going on much longer than the dysfunctional uterine bleeding.  - POCT urine pregnancy - INR/PT - Iron, TIBC and Ferritin Panel - Von Willebrand panel - FSH/LH -  Estradiol - CBC with Differential/Platelet - TSH + free T4 - Basic Metabolic Panel (BMET) - hCG, serum, qualitative   General Counseling: Rossi verbalizes understanding of the findings of todays visit and agrees with plan of treatment. I have discussed any further diagnostic evaluation that may be needed or ordered today. We also reviewed her medications today. she has been encouraged to call the office with any questions or concerns that should arise related to todays visit.    Counseling:    Orders Placed This Encounter  Procedures   Chlamydia/Gonococcus/Trichomonas, NAA   INR/PT   Iron, TIBC and Ferritin Panel   Von Willebrand panel   FSH/LH   Estradiol   CBC with Differential/Platelet   TSH + free T4   Basic Metabolic Panel (BMET)   hCG, serum, qualitative   HIV antibody (with reflex)   Coag Studies  Interp Report   POCT urine pregnancy    No orders of the defined types were placed in this encounter.   Return for stat imaging and labs will call patient with results and next steps or f/u appt.  [place] Controlled Substance Database was reviewed by me for overdose risk score (ORS)  Time spent:30 Minutes Time spent with patient included reviewing progress notes, labs, imaging studies, and discussing plan for follow up.   This patient was seen by Jonetta Osgood, FNP-C in collaboration with Dr. Clayborn Bigness as a part of collaborative care agreement.  Ariann Khaimov R. Valetta Fuller, MSN, FNP-C Internal Medicine

## 2022-04-09 ENCOUNTER — Ambulatory Visit: Payer: 59 | Admitting: Nurse Practitioner

## 2022-04-09 ENCOUNTER — Encounter: Payer: Self-pay | Admitting: Nurse Practitioner

## 2022-04-09 VITALS — BP 101/70 | HR 83 | Temp 98.4°F | Resp 16 | Ht 61.0 in | Wt 88.8 lb

## 2022-04-09 DIAGNOSIS — R102 Pelvic and perineal pain: Secondary | ICD-10-CM | POA: Diagnosis not present

## 2022-04-09 DIAGNOSIS — R9389 Abnormal findings on diagnostic imaging of other specified body structures: Secondary | ICD-10-CM

## 2022-04-09 DIAGNOSIS — M629 Disorder of muscle, unspecified: Secondary | ICD-10-CM

## 2022-04-09 NOTE — Progress Notes (Signed)
[facility] [place], [place] [address]  Internal MEDICINE  Office Visit Note  Patient Name: Mariah Bird  [id number]  [id number]  Date of Service: 04/09/2022  Chief Complaint  Patient presents with   Follow-up    Diagnostic pap for abnormal pelvic ultrasound    HPI [name] presents for a follow up visit for pap smear due to abnormal pelvic ultrasound showing thickening of the cervix with concern due to last pap result being ASC-US in June 2021.  --lost 2 more lbs since yesterday, weight is now 88 lbs.  --has tender spot on right gluteal/posterior hip. Showed up last night, tender to touch with a deep hard knot.     Current Medication: Outpatient Encounter Medications as of 04/09/2022  Medication Sig   citalopram (CELEXA) 20 MG tablet Take 1 tablet (20 mg total) by mouth daily.   diphenoxylate-atropine (LOMOTIL) 2.5-0.025 MG tablet Take 1 tablet by mouth 4 (four) times daily as needed for diarrhea or loose stools.   dronabinol (MARINOL) 5 MG capsule Take 1 capsule (5 mg total) by mouth 2 (two) times daily before lunch and supper. Take 1 hour before each meal (except breakfast)   megestrol (MEGACE) 40 MG tablet Take 2 tablets (80 mg total) by mouth 2 (two) times daily.   ondansetron (ZOFRAN ODT) 4 MG disintegrating tablet '4mg'$  ODT q4 hours prn nausea/vomit   traZODone (DESYREL) 50 MG tablet Take 0.5-1 tablets (25-50 mg total) by mouth at bedtime as needed for sleep.   Vitamin D, Ergocalciferol, (DRISDOL) 1.25 MG (50000 UNIT) CAPS capsule Take 1 capsule (50,000 Units total) by mouth every 7 (seven) days.   No facility-administered encounter medications on file as of 04/09/2022.    Surgical History: Past Surgical History:  Procedure Laterality Date   KNEE ARTHROSCOPY Right    NASAL SINUS SURGERY  12/18/2016    Medical History: Past Medical History:  Diagnosis Date   Anxiety    Depression    Hyperthyroidism    Nephrolithiasis     Family  History: Family History  Problem Relation Age of Onset   Lupus Sister    Kidney disease Maternal Uncle    Kidney Stones Paternal Grandmother    Bladder Cancer Neg Hx    Kidney cancer Neg Hx    Prostate cancer Neg Hx     Social History   Socioeconomic History   Marital status: Single    Spouse name: Not on file   Number of children: Not on file   Years of education: Not on file   Highest education level: Not on file  Occupational History   Not on file  Tobacco Use   Smoking status: Never   Smokeless tobacco: Never  Vaping Use   Vaping Use: Never used  Substance and Sexual Activity   Alcohol use: Not Currently    Comment: occ   Drug use: Yes    Types: Marijuana    Comment: occ   Sexual activity: Not on file  Other Topics Concern   Not on file  Social History Narrative   Not on file   Social Determinants of Health   Financial Resource Strain: Not on file  Food Insecurity: Not on file  Transportation Needs: Not on file  Physical Activity: Not on file  Stress: Not on file  Social Connections: Not on file  Intimate Partner Violence: Not on file      Review of Systems  Constitutional:  Positive for appetite change (continued decreased/lack of appetite.  has not started dronabinol yet.), fatigue and unexpected weight change (lost 4 more lbs, down to 90 lbs, BMI 17.16). Negative for chills and fever.  HENT: Negative.  Negative for dental problem.   Eyes: Negative.   Respiratory: Negative.  Negative for cough, chest tightness, shortness of breath and wheezing.   Cardiovascular: Negative.  Negative for chest pain and palpitations.  Gastrointestinal:  Positive for abdominal distention, nausea and rectal pain (has an irritated small hemorrhoid).       Increased frequency and variable consistency of stool  Endocrine:       Temp fluctuations, hair loss, weight loss, need to order labs to rule out hormone abnormalities  Genitourinary:  Positive for frequency, menstrual  problem, pelvic pain and vaginal bleeding. Negative for difficulty urinating, dysuria, enuresis, flank pain, hematuria and vaginal discharge.  Musculoskeletal:  Positive for myalgias.  Skin: Negative.   Allergic/Immunologic: Negative for immunocompromised state.  Neurological:  Positive for weakness (muscle weakness, body ache).  Hematological:  Bruises/bleeds easily.  Psychiatric/Behavioral:  The patient is nervous/anxious (expected due to current medical problem).     Vital Signs: BP 101/70   Pulse 83   Temp 98.4 F (36.9 C)   Resp 16   Ht '5\' 1"'$  (1.549 m)   Wt 88 lb 12.8 oz (40.3 kg)   SpO2 97%   BMI 16.78 kg/m    Physical Exam Vitals reviewed.  Constitutional:      General: She is not in acute distress.    Appearance: Normal appearance. She is underweight. She is not ill-appearing.  HENT:     Head: Normocephalic and atraumatic.  Eyes:     Pupils: Pupils are equal, round, and reactive to light.  Cardiovascular:     Rate and Rhythm: Normal rate and regular rhythm.  Pulmonary:     Effort: Pulmonary effort is normal. No respiratory distress.  Abdominal:     Hernia: There is no hernia in the left inguinal area or right inguinal area.  Genitourinary:    General: Normal vulva.     Exam position: Lithotomy position.     Pubic Area: No rash or pubic lice.      Labia:        Right: No rash, tenderness, lesion or injury.        Left: No rash, tenderness, lesion or injury.      Urethra: No prolapse, urethral pain, urethral swelling or urethral lesion.     Vagina: Normal. No signs of injury and foreign body. No vaginal discharge, erythema, tenderness, bleeding, lesions or prolapsed vaginal walls.     Cervix: Discharge (pale mucous membranes, most likely anemic due to blood loss) and cervical bleeding present. No cervical motion tenderness, friability, erythema or eversion.     Uterus: Normal.      Adnexa: Right adnexa normal and left adnexa normal.       Right: No mass,  tenderness or fullness.         Left: No mass, tenderness or fullness.       Rectum: Normal. No mass, tenderness or anal fissure.  Musculoskeletal:       Legs:     Comments: Nodule in deep right gluteal muscle.   Lymphadenopathy:     Lower Body: No right inguinal adenopathy. No left inguinal adenopathy.  Neurological:     Mental Status: She is alert and oriented to person, place, and time.  Psychiatric:        Mood and Affect: Mood normal.  Behavior: Behavior normal.        Assessment/Plan: 1. Pelvic pain Last pap was abnormal, ASC-US, HPV negative. Pap repeated today.  - IGP, Aptima HPV - NuSwab Vaginitis Plus (VG+)  2. Abnormal ultrasound of pelvis Thickening of cervix with fluid within the cervix seen on stat ultrasound yesterday, along with current signs and symptoms, pap done today to rule out cervical cancer.  - IGP, Aptima HPV - NuSwab Vaginitis Plus (VG+)  3. Nodule in muscle Small hard tender nodule in deep gluteal muscle tissue, tender to light and deep palpation, sudden onset, unknown etiology - US PELVIS (TRANSABDOMINAL ONLY); Future - US PELVIS (TRANSABDOMINAL ONLY)   General Counseling: Joeli verbalizes understanding of the findings of todays visit and agrees with plan of treatment. I have discussed any further diagnostic evaluation that may be needed or ordered today. We also reviewed her medications today. she has been encouraged to call the office with any questions or concerns that should arise related to todays visit.    Orders Placed This Encounter  Procedures   US PELVIS LIMITED (TRANSABDOMINAL ONLY)   NuSwab Vaginitis Plus (VG+)    No orders of the defined types were placed in this encounter.   Return for previously scheduled upcoming virtual visit to discuss labs with , [place] PCP.   Total time spent:30 Minutes Time spent includes review of chart, medications, test results, and follow up plan with the patient.   [place] Controlled  Substance Database was reviewed by me.  This patient was seen by Jonetta Osgood, FNP-C in collaboration with Dr. Clayborn Bigness as a part of collaborative care agreement.   Courtany Mcmurphy R. Valetta Fuller, MSN, FNP-C Internal medicine

## 2022-04-11 LAB — CHLAMYDIA/GONOCOCCUS/TRICHOMONAS, NAA
Chlamydia by NAA: NEGATIVE
Gonococcus by NAA: NEGATIVE
Trich vag by NAA: NEGATIVE

## 2022-04-12 ENCOUNTER — Encounter: Payer: Self-pay | Admitting: Nurse Practitioner

## 2022-04-13 ENCOUNTER — Telehealth: Payer: Self-pay

## 2022-04-13 ENCOUNTER — Ambulatory Visit: Payer: 59 | Admitting: Nurse Practitioner

## 2022-04-13 ENCOUNTER — Ambulatory Visit
Admission: RE | Admit: 2022-04-13 | Discharge: 2022-04-13 | Disposition: A | Discharge status: Home / Self Care | Payer: 59 | Source: Ambulatory Visit | Attending: Nurse Practitioner | Admitting: Nurse Practitioner

## 2022-04-13 ENCOUNTER — Emergency Department: Payer: 59

## 2022-04-13 ENCOUNTER — Emergency Department
Admission: EM | Admit: 2022-04-13 | Discharge: 2022-04-13 | Disposition: A | Discharge status: Home / Self Care | Payer: 59 | Attending: Emergency Medicine | Admitting: Emergency Medicine

## 2022-04-13 ENCOUNTER — Other Ambulatory Visit: Payer: Self-pay

## 2022-04-13 ENCOUNTER — Encounter: Payer: Self-pay | Admitting: Nurse Practitioner

## 2022-04-13 VITALS — BP 119/85 | HR 60 | Temp 97.8°F | Resp 16 | Ht 61.0 in | Wt 88.0 lb

## 2022-04-13 DIAGNOSIS — N899 Noninflammatory disorder of vagina, unspecified: Secondary | ICD-10-CM | POA: Insufficient documentation

## 2022-04-13 DIAGNOSIS — R102 Pelvic and perineal pain: Secondary | ICD-10-CM | POA: Insufficient documentation

## 2022-04-13 DIAGNOSIS — N819 Female genital prolapse, unspecified: Secondary | ICD-10-CM

## 2022-04-13 DIAGNOSIS — M629 Disorder of muscle, unspecified: Secondary | ICD-10-CM | POA: Diagnosis not present

## 2022-04-13 DIAGNOSIS — B3731 Acute candidiasis of vulva and vagina: Secondary | ICD-10-CM

## 2022-04-13 DIAGNOSIS — R634 Abnormal weight loss: Secondary | ICD-10-CM | POA: Diagnosis not present

## 2022-04-13 DIAGNOSIS — R9389 Abnormal findings on diagnostic imaging of other specified body structures: Secondary | ICD-10-CM | POA: Diagnosis not present

## 2022-04-13 LAB — CBC WITH DIFFERENTIAL/PLATELET
Abs Immature Granulocytes: 0.01 10*3/uL (ref 0.00–0.07)
Basophils Absolute: 0 10*3/uL (ref 0.0–0.2)
Basophils Absolute: 0 10*3/uL (ref 0.0–0.1)
Basophils Relative: 0 %
Basos: 1 %
EOS (ABSOLUTE): 0 10*3/uL (ref 0.0–0.4)
Eos: 1 %
Eosinophils Absolute: 0 10*3/uL (ref 0.0–0.5)
Eosinophils Relative: 0 %
HCT: 41.1 % (ref 36.0–46.0)
Hematocrit: 41.7 % (ref 34.0–46.6)
Hemoglobin: 14.1 g/dL (ref 11.1–15.9)
Hemoglobin: 14.2 g/dL (ref 12.0–15.0)
Immature Grans (Abs): 0 10*3/uL (ref 0.0–0.1)
Immature Granulocytes: 0 %
Immature Granulocytes: 0 %
Lymphocytes Absolute: 1.7 10*3/uL (ref 0.7–3.1)
Lymphocytes Relative: 31 %
Lymphs Abs: 1.5 10*3/uL (ref 0.7–4.0)
Lymphs: 39 %
MCH: 33 pg (ref 26.6–33.0)
MCH: 32.9 pg (ref 26.0–34.0)
MCHC: 33.8 g/dL (ref 31.5–35.7)
MCHC: 34.5 g/dL (ref 30.0–36.0)
MCV: 98 fL — ABNORMAL HIGH (ref 79–97)
MCV: 95.1 fL (ref 80.0–100.0)
Monocytes Absolute: 0.4 10*3/uL (ref 0.1–0.9)
Monocytes Absolute: 0.4 10*3/uL (ref 0.1–1.0)
Monocytes Relative: 8 %
Monocytes: 10 %
Neutro Abs: 3 10*3/uL (ref 1.7–7.7)
Neutrophils Absolute: 2.1 10*3/uL (ref 1.4–7.0)
Neutrophils Relative %: 61 %
Neutrophils: 49 %
Platelets: 286 10*3/uL (ref 150–450)
Platelets: 274 10*3/uL (ref 150–400)
RBC: 4.27 x10E6/uL (ref 3.77–5.28)
RBC: 4.32 MIL/uL (ref 3.87–5.11)
RDW: 12.5 % (ref 11.7–15.4)
RDW: 12.4 % (ref 11.5–15.5)
WBC: 4.2 10*3/uL (ref 3.4–10.8)
WBC: 5 10*3/uL (ref 4.0–10.5)
nRBC: 0 % (ref 0.0–0.2)

## 2022-04-13 LAB — COMPREHENSIVE METABOLIC PANEL
ALT: 16 U/L (ref 0–44)
AST: 20 U/L (ref 15–41)
Albumin: 4.9 g/dL (ref 3.5–5.0)
Alkaline Phosphatase: 61 U/L (ref 38–126)
Anion gap: 10 (ref 5–15)
BUN: 14 mg/dL (ref 6–20)
CO2: 27 mmol/L (ref 22–32)
Calcium: 9.6 mg/dL (ref 8.9–10.3)
Chloride: 102 mmol/L (ref 98–111)
Creatinine, Ser: 0.47 mg/dL (ref 0.44–1.00)
GFR, Estimated: >60 mL/min (ref >60)
Glucose, Bld: 121 mg/dL — ABNORMAL HIGH (ref 70–99)
Potassium: 3.4 mmol/L — ABNORMAL LOW (ref 3.5–5.1)
Sodium: 139 mmol/L (ref 135–145)
Total Bilirubin: 0.8 mg/dL (ref 0.3–1.2)
Total Protein: 8.6 g/dL — ABNORMAL HIGH (ref 6.5–8.1)

## 2022-04-13 LAB — NUSWAB VAGINITIS PLUS (VG+)
Candida albicans, NAA: POSITIVE — AB
Candida glabrata, NAA: NEGATIVE
Chlamydia trachomatis, NAA: NEGATIVE
Neisseria gonorrhoeae, NAA: NEGATIVE
Trich vag by NAA: NEGATIVE

## 2022-04-13 LAB — BASIC METABOLIC PANEL
BUN/Creatinine Ratio: 17 (ref 9–23)
BUN: 12 mg/dL (ref 6–20)
CO2: 19 mmol/L — ABNORMAL LOW (ref 20–29)
Calcium: 10.1 mg/dL (ref 8.7–10.2)
Chloride: 103 mmol/L (ref 96–106)
Creatinine, Ser: 0.71 mg/dL (ref 0.57–1.00)
Glucose: 84 mg/dL (ref 70–99)
Potassium: 4.3 mmol/L (ref 3.5–5.2)
Sodium: 141 mmol/L (ref 134–144)
eGFR: 114 mL/min/{1.73_m2} (ref >59)

## 2022-04-13 LAB — WET PREP, GENITAL
Clue Cells Wet Prep HPF POC: NONE SEEN
Sperm: NONE SEEN
Trich, Wet Prep: NONE SEEN
WBC, Wet Prep HPF POC: <10 (ref <10)
Yeast Wet Prep HPF POC: NONE SEEN

## 2022-04-13 LAB — FSH/LH
FSH: 6.8 m[IU]/mL
LH: 5.2 m[IU]/mL

## 2022-04-13 LAB — CHLAMYDIA/NGC RT PCR (ARMC ONLY)
Chlamydia Tr: NOT DETECTED
N gonorrhoeae: NOT DETECTED

## 2022-04-13 LAB — IRON,TIBC AND FERRITIN PANEL
Ferritin: 45 ng/mL (ref 15–150)
Iron Saturation: 25 % (ref 15–55)
Iron: 94 ug/dL (ref 27–159)
Total Iron Binding Capacity: 374 ug/dL (ref 250–450)
UIBC: 280 ug/dL (ref 131–425)

## 2022-04-13 LAB — POC URINE PREG, ED: Preg Test, Ur: NEGATIVE

## 2022-04-13 LAB — HCG, SERUM, QUALITATIVE: hCG,Beta Subunit,Qual,Serum: NEGATIVE m[IU]/mL (ref <6)

## 2022-04-13 LAB — TSH+FREE T4
Free T4: 1.3 ng/dL (ref 0.82–1.77)
TSH: 0.802 u[IU]/mL (ref 0.450–4.500)

## 2022-04-13 LAB — PROTIME-INR
INR: 1.1 (ref 0.9–1.2)
Prothrombin Time: 11.4 s (ref 9.1–12.0)

## 2022-04-13 LAB — VON WILLEBRAND PANEL
Factor VIII Activity: 68 % (ref 56–140)
Von Willebrand Ag: 75 % (ref 50–200)
Von Willebrand Factor: 45 % — ABNORMAL LOW (ref 50–200)

## 2022-04-13 LAB — COAG STUDIES INTERP REPORT

## 2022-04-13 LAB — TYPE AND SCREEN
ABO/RH(D): O POS
Antibody Screen: NEGATIVE

## 2022-04-13 LAB — ESTRADIOL: Estradiol: 68.6 pg/mL

## 2022-04-13 MED ORDER — IOHEXOL 300 MG/ML  SOLN: 100.0000 mL | Freq: Once | INTRAMUSCULAR | Status: DC | PRN

## 2022-04-13 MED ORDER — CEFTRIAXONE SODIUM 1 G IJ SOLR
500.0000 mg | Freq: Once | INTRAMUSCULAR | Status: AC
  Administered 2022-04-13: 500 mg via INTRAMUSCULAR
  Filled 2022-04-13: qty 10

## 2022-04-13 MED ORDER — LIDOCAINE HCL (PF) 1 % IJ SOLN
5.0000 mL | Freq: Once | INTRAMUSCULAR | Status: AC
  Administered 2022-04-13: 5 mL
  Filled 2022-04-13: qty 5

## 2022-04-13 MED ORDER — METRONIDAZOLE 500 MG PO TABS: 500.0000 mg | ORAL_TABLET | Freq: Two times a day (BID) | ORAL | 0 refills | Status: AC

## 2022-04-13 MED ORDER — DOXYCYCLINE MONOHYDRATE 100 MG PO TABS: 100.0000 mg | ORAL_TABLET | Freq: Two times a day (BID) | ORAL | 0 refills | Status: AC

## 2022-04-13 MED ORDER — KETOROLAC TROMETHAMINE 15 MG/ML IJ SOLN
15.0000 mg | Freq: Once | INTRAMUSCULAR | Status: AC
  Administered 2022-04-13: 15 mg via INTRAVENOUS
  Filled 2022-04-13: qty 1

## 2022-04-13 MED ORDER — IOHEXOL 350 MG/ML SOLN
75.0000 mL | Freq: Once | INTRAVENOUS | Status: AC | PRN
  Administered 2022-04-13: 75 mL via INTRAVENOUS

## 2022-04-13 MED ORDER — DOXYCYCLINE HYCLATE 100 MG PO TABS
100.0000 mg | ORAL_TABLET | Freq: Once | ORAL | Status: AC
  Administered 2022-04-13: 100 mg via ORAL
  Filled 2022-04-13: qty 1

## 2022-04-13 MED ORDER — NAPROXEN 250 MG PO TABS: 250.0000 mg | ORAL_TABLET | Freq: Two times a day (BID) | ORAL | 0 refills | Status: AC

## 2022-04-13 MED ORDER — METRONIDAZOLE 500 MG PO TABS
500.0000 mg | ORAL_TABLET | Freq: Once | ORAL | Status: AC
Start: 2022-04-13 — End: 2022-04-13
  Administered 2022-04-13: 500 mg via ORAL
  Filled 2022-04-13: qty 1

## 2022-04-13 MED ORDER — OXYCODONE-ACETAMINOPHEN 5-325 MG PO TABS
1.0000 | ORAL_TABLET | Freq: Once | ORAL | Status: AC
  Administered 2022-04-13: 1 via ORAL
  Filled 2022-04-13: qty 1

## 2022-04-13 NOTE — Discharge Instructions (Signed)
Your blood work is reassuring.  Your CAT scan showed some nonspecific increased vascularity to the vagina region which could be due to infection or inflammation.  Please take the antibiotics twice a day for the next 14 days for treatment of potential pelvic inflammatory disease.  If you develop fevers or pain that is not controlled please return to the emergency department.  Otherwise please follow-up with your primary care provider and your gynecologist.

## 2022-04-13 NOTE — Progress Notes (Signed)
Please let the patient know that she has a yeast infection but was negative for BV and STDs. Please send fluconazole 150 mg PO once for 1 dose but send 3 tabs, she can take a second dose 3 days later if symptomatic x2

## 2022-04-13 NOTE — Progress Notes (Unsigned)
[facility] [place], [place] [address]  Internal MEDICINE  Office Visit Note  Patient Name: Mariah Bird  [id number]  [id number]  Date of Service: 04/13/2022  Chief Complaint  Patient presents with   Vaginal Prolapse     HPI Mariah Bird presents for an acute sick visit for severe pelvic pain, pressure in vaginal canal. Thinks she has something prolapsed in her vagina.  --has yeast infection per lab results --pap smear is pending --was seen twice last week for DUB/irregular menstrual cycles. Since her visit on thursday last week, her pelvic pain has increased, there is a brown discharge, she felt something bulging in her vaginal canal, feeling like there is a thickness or pressure there. Her lower abdomen feels distended and tight per patient. She has also developed urinary incontinence and cannot hold her urine if her bladder is even partially full.   Pelvic ultrasound TVP 8/23 last week: IMPRESSION: Normal endometrial thickness with no uterine abnormality. If bleeding remains unresponsive to hormonal or medical therapy, sonohysterogram should be considered for focal lesion work-up. (Ref: Radiological Reasoning: Algorithmic Workup of Abnormal Vaginal Bleeding with Endovaginal Sonography and Sonohysterography. AJR 2008; 737:T06-26) Fullness in the cervix with fluid in the cervix which may be artifactual but there is a small amount of fluid in the endocervical canal as fullness of the cervix with a small amount of fluid in the endocervical canal. Findings are of uncertain significance and this could be within the range of normal and accentuated by the angle of the cervix, uterus and transducer. Would however correlate with recent Pap smear results or with follow-up Pap smear as warranted for further evaluation. Normal appearance of bilateral ovaries without free fluid.  Pelvic ultrasound right buttock-- IMPRESSION: Ill-defined hyperechoic area  measuring 1.5 cm in the superficial subcutaneous fat of the right buttock at the patient identified site of palpable abnormality. This is most likely a small focus of fat necrosis secondary to minor trauma or a small lipoma. Consider additional imaging or surgical referral if there is concerning clinical change, such as continued growth or persistent referable pain.   Vaginal exam showed something protruding in vaginal canal, unable to perform speculum exam, caused excruciating pain.   Current Medication:  Outpatient Encounter Medications as of 04/13/2022  Medication Sig   citalopram (CELEXA) 20 MG tablet Take 1 tablet (20 mg total) by mouth daily.   diphenoxylate-atropine (LOMOTIL) 2.5-0.025 MG tablet Take 1 tablet by mouth 4 (four) times daily as needed for diarrhea or loose stools.   dronabinol (MARINOL) 5 MG capsule Take 1 capsule (5 mg total) by mouth 2 (two) times daily before lunch and supper. Take 1 hour before each meal (except breakfast)   megestrol (MEGACE) 40 MG tablet Take 2 tablets (80 mg total) by mouth 2 (two) times daily.   ondansetron (ZOFRAN ODT) 4 MG disintegrating tablet '4mg'$  ODT q4 hours prn nausea/vomit   traZODone (DESYREL) 50 MG tablet Take 0.5-1 tablets (25-50 mg total) by mouth at bedtime as needed for sleep.   Vitamin D, Ergocalciferol, (DRISDOL) 1.25 MG (50000 UNIT) CAPS capsule Take 1 capsule (50,000 Units total) by mouth every 7 (seven) days.   No facility-administered encounter medications on file as of 04/13/2022.      Medical History: Past Medical History:  Diagnosis Date   Anxiety    Depression    Hyperthyroidism    Nephrolithiasis      Vital Signs: BP 119/85   Pulse 60   Temp 97.8 F (36.6 C)  Resp 16   Ht '5\' 1"'$  (1.549 m)   Wt 88 lb (39.9 kg)   LMP 04/05/2022   SpO2 98%   BMI 16.63 kg/m    Review of Systems  Constitutional:  Positive for appetite change (continued decreased/lack of appetite. has not started dronabinol yet.),  fatigue and unexpected weight change (lost 2 more lbs, down to 88 lbs, BMI 16.63). Negative for chills and fever.  HENT: Negative.  Negative for dental problem.   Eyes: Negative.   Respiratory: Negative.  Negative for cough, chest tightness, shortness of breath and wheezing.   Cardiovascular: Negative.  Negative for chest pain and palpitations.  Gastrointestinal:  Positive for abdominal distention, nausea and rectal pain (has an irritated small hemorrhoid).       Increased frequency and variable consistency of stool  Endocrine:       Temp fluctuations, hair loss, weight loss, need to order labs to rule out hormone abnormalities  Genitourinary:  Positive for frequency, menstrual problem, pelvic pain, vaginal bleeding, vaginal discharge and vaginal pain. Negative for difficulty urinating, dysuria, enuresis, flank pain and hematuria.       New onset urinary incontinence and pelvic/vaginal pressure  Musculoskeletal:  Positive for myalgias.  Skin: Negative.   Allergic/Immunologic: Negative for immunocompromised state.  Neurological:  Positive for weakness (muscle weakness, body ache).  Hematological:  Bruises/bleeds easily.  Psychiatric/Behavioral:  The patient is nervous/anxious (expected due to current medical problem).     Physical Exam Vitals and nursing note reviewed.  Constitutional:      General: She is not in acute distress.    Appearance: Normal appearance. She is underweight. She is not ill-appearing.  HENT:     Head: Normocephalic and atraumatic.  Eyes:     Pupils: Pupils are equal, round, and reactive to light.  Cardiovascular:     Rate and Rhythm: Normal rate and regular rhythm.  Pulmonary:     Effort: Pulmonary effort is normal. No respiratory distress.  Abdominal:     Hernia: There is no hernia in the left inguinal area or right inguinal area.  Genitourinary:    General: Normal vulva.     Exam position: Lithotomy position.     Pubic Area: No rash or pubic lice.       Labia:        Right: No rash, tenderness, lesion or injury.        Left: No rash, tenderness, lesion or injury.      Urethra: No prolapse, urethral pain, urethral swelling or urethral lesion.     Vagina: No signs of injury and foreign body. Vaginal discharge, tenderness and prolapsed vaginal walls (small bulge noted at introitus, possible prolapse, unable to see further or determine if only vaginal or if uterus is prolapsed) present. No erythema, bleeding or lesions.     Cervix: Discharge: pale mucous membranes, most likely anemic due to blood loss.     Adnexa: Right adnexa normal.       Right: No mass, tenderness or fullness.         Left: Tenderness present. No mass or fullness.       Rectum: Normal. No mass, tenderness or anal fissure.     Comments: Unable to perform complete speculum exam due to severe tenderness and pain and possible vaginal prolapse Musculoskeletal:       Legs:     Comments: Nodule in deep right gluteal muscle.   Lymphadenopathy:     Lower Body: No right inguinal adenopathy. No left  inguinal adenopathy.  Neurological:     Mental Status: She is alert and oriented to person, place, and time.  Psychiatric:        Mood and Affect: Mood normal.        Behavior: Behavior normal.       Assessment/Plan: 1. Pelvic pain ***  2. Female genital prolapse, unspecified type ***  3. Abnormal ultrasound of pelvis ***  4. Nodule in muscle ***  5. Vulvovaginal candidiasis ***   General Counseling: Anderson Malta verbalizes understanding of the findings of todays visit and agrees with plan of treatment. I have discussed any further diagnostic evaluation that may be needed or ordered today. We also reviewed her medications today. she has been encouraged to call the office with any questions or concerns that should arise related to todays visit.    Counseling:    No orders of the defined types were placed in this encounter.   No orders of the defined types were  placed in this encounter.   Return for patient 's mother picked her up and took her to ER, which I instructed her to go to ER.  [place] Controlled Substance Database was reviewed by me for overdose risk score (ORS)  Time spent:30 Minutes Time spent with patient included reviewing progress notes, labs, imaging studies, and discussing plan for follow up.   This patient was seen by Jonetta Osgood, FNP-C in collaboration with Dr. Clayborn Bigness as a part of collaborative care agreement.  Jomo Forand R. Valetta Fuller, MSN, FNP-C Internal Medicine

## 2022-04-13 NOTE — Telephone Encounter (Signed)
-----   Message from Jonetta Osgood, NP sent at 04/13/2022  6:31 AM EDT ----- Please let the patient know that she has a yeast infection but was negative for BV and STDs. Please send fluconazole 150 mg PO once for 1 dose but send 3 tabs, she can take a second dose 3 days later if symptomatic x2

## 2022-04-13 NOTE — ED Provider Notes (Signed)
[facility] Provider Note    Event Date/Time   First MD Initiated Contact with Patient 04/13/22 1241     (approximate)   History   Vaginal Bleeding   HPI  Mariah Bird is a 36 y.o. female past medical history of anxiety depression presents with pelvic pain.  Patient has had pelvic pain for about 10 days.  Menstrual period started the second of the month and returned again the 18th and since that time she has had ongoing pain.  Bleeding was initially heavy but it has slowed down and she is now having some brown-pink discharge.  Pain is in the suprapubic region and vaginal pelvic area does not localize to either side.  Has been more severe over the last day or so.  Denies dysuria hematuria.  No dyspareunia.  Saw her primary doctor several days ago and had a pelvic ultrasound that was ordered that showed some nonspecific fluid in her cervix.  Pap smear was performed but has not resulted yet.  Pregnancy test and GC chlamydia were negative.  Patient presented today to her PCPs office and there was some concern for pelvic organ prolapse and patient was in significant pain so she was referred to the ED.  She has not been taking anything over-the-counter for pain.  She is sexually active.  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 --      Excl. in [place]? --     Most recent vital signs: Vitals:   04/13/22 1147 04/13/22 1533  BP: (!) 129/96   Pulse: (!) 138 80  Resp: 18   Temp: 98.7 F (37.1 C)   SpO2: 100%      General: Awake, patient is thin, appears uncomfortable CV:  Good peripheral perfusion.  Resp:  Normal effort.  Abd:  No distention.  Abdomen is soft mild tenderness in the suprapubic region and right lower quadrant without guarding Neuro:             Awake, Alert, Oriented x 3  Other:  Normal external GU exam, there is significant discomfort with speculum exam, there is a ulceration on the cervix at the 9 o'clock position near the os, scant yellow discharge, mild cervical motion tenderness, no adnexal masses   ED Results / Procedures / Treatments  Labs (all  labs ordered are listed, but only abnormal results are displayed) Labs Reviewed  COMPREHENSIVE METABOLIC PANEL - Abnormal; Notable for the following components:      Result Value   Potassium 3.4 (*)    Glucose, Bld 121 (*)    Total Protein 8.6 (*)    All other components within normal limits  WET PREP, GENITAL  CHLAMYDIA/NGC RT PCR ([facility] ONLY)            CBC WITH DIFFERENTIAL/PLATELET  POC URINE PREG, ED  TYPE AND SCREEN     EKG     RADIOLOGY CT abdomen pelvis interpreted myself shows increased vascularity in the vagina region no abscess   PROCEDURES:  Critical Care performed: No  Procedures   MEDICATIONS ORDERED IN ED: Medications  cefTRIAXone (ROCEPHIN) injection 500 mg (has no administration in time range)  doxycycline (VIBRA-TABS) tablet 100 mg (has no administration in time range)  metroNIDAZOLE (FLAGYL) tablet 500 mg (has no administration in time range)  oxyCODONE-acetaminophen (PERCOCET/ROXICET) 5-325 MG per tablet  1 tablet (1 tablet Oral Given 04/13/22 1345)  ketorolac (TORADOL) 15 MG/ML injection 15 mg (15 mg Intravenous Given 04/13/22 1426)  iohexol (OMNIPAQUE) 350 MG/ML injection 75 mL (75 mLs Intravenous Contrast Given 04/13/22 1434)     IMPRESSION / MDM / ASSESSMENT AND PLAN / ED COURSE  I reviewed the triage vital signs and the nursing notes.                              Patient's presentation is most consistent with acute complicated illness / injury requiring diagnostic workup.  Differential diagnosis includes, but is not limited to, PID, cervical prolapse, cervical malignancy, chronic pelvic pain, endometriosis  Patient is a 36 year old female presenting with pelvic pain.'s been going on for about 10 days seem to start after her menstrual period came on twice this month.  She is no longer bleeding but is having some vaginal discharge that is pink/brown.  Pelvic pain does not localize to either lower quadrant and is rather diffuse.  She has no fevers chills nausea vomiting diarrhea or constipation.  Patient has had some unintentional weight loss which she is seeing her PCP for.  She had an ultrasound 4 days ago which showed some nonspecific fluid in the cervix.  Radiology recommends correlation with Pap smear which was performed by her PCP but is not yet resulted.  Has had STD checks that were negative.  Patient looks thin somewhat uncomfortable appearing.  Abdominal exam overall is reassuring and benign there is some mild tenderness in bilateral lower quadrants specifically on the right side.  On pelvic exam she is quite sensitive to the speculum and there is an abnormality on the cervix which almost looks like an ulceration or abrasion near the cervical os at the 9 o'clock position, which is somewhat suspicious appearing.  There is also some yellow discharge.  She has some cervical motion tenderness but no obvious adnexal tenderness.  Patient says the pain is really similar in character as several days  ago when she had the ultrasound of her pelvis did not feel that repeating this to be of much use.  Given the mild right lower quadrant tenderness I did obtain a CT abdomen pelvis to further assess for any obvious masses or appendicitis is negative for any acute surgical process but does show some nonspecific increased vascularity of the vagina which could represent infectious or inflammatory process.  Patient's labs overall  reassuring pregnancy test is negative, there is no leukocytosis.  I am concerned about potential underlying malignancy specially given the patient's weight loss and this finding on cervical exam.  She has had a Pap smear in and will follow-up with her primary doctor and her gynecologist.  With the cervical motion tenderness discharge and the fact that she is sexually active I will treat her empirically for PID to see if this improves her symptoms at all.  She is given first dose of antibiotics in the ED and discharged with prescription for Doxy and Flagyl.  Recommende       FINAL CLINICAL IMPRESSION(S) / ED DIAGNOSES   Final diagnoses:  Pelvic pain     Rx / DC Orders   ED Discharge Orders          Ordered    doxycycline (ADOXA) 100 MG tablet  2 times daily        04/13/22 1542    metroNIDAZOLE (FLAGYL) 500 MG tablet  2 times daily        04/13/22 1542    naproxen (NAPROSYN) 250 MG tablet  2 times daily with meals        04/13/22 1546             Note:  This document was prepared using Dragon voice recognition software and may include unintentional dictation errors.   Rada Hay, MD 04/13/22 [phone]

## 2022-04-13 NOTE — ED Triage Notes (Signed)
Pt coming from home today POV with vaginal pain, bleeding and abdominal pain, tenderness around bladder and bloating. Pt stating pain is a very irritating dull pain with burning near ovaries.  Pt PMH ovarian cysts, but no other reproductive history.   Pt has not taken anything for pain today.

## 2022-04-13 NOTE — ED Provider Triage Note (Signed)
Emergency Medicine Provider Triage Evaluation Note  Mariah Bird , a 36 y.o. female  was evaluated in triage.  Pt complains of vaginal issues.  Has been having vaginal bleeding for 1 month.  Has had ultrasound x 2 and was at PCP office today and told to come to ED.    Review of Systems  Positive: Vaginal bleeding.   Negative: No headache, dizziness  Physical Exam  BP (!) 129/96 (BP Location: Left Arm)   Pulse (!) 138   Temp 98.7 F (37.1 C) (Oral)   Resp 18   Ht '5\' 1"'$  (1.549 m)   Wt 40.3 kg   SpO2 100%   BMI 16.78 kg/m  Gen:   Awake, no distress   Resp:  Normal effort  MSK:   Moves extremities without difficulty  Other:    Medical Decision Making  Medically screening exam initiated at 11:49 AM.  Appropriate orders placed.  Mariah Bird was informed that the remainder of the evaluation will be completed by another provider, this initial triage assessment does not replace that evaluation, and the importance of remaining in the ED until their evaluation is complete.     Johnn Hai, PA-C 04/18/22 1546

## 2022-04-13 NOTE — Telephone Encounter (Signed)
Patient is in for visit with Alyssa, will discuss results in office.

## 2022-04-14 ENCOUNTER — Encounter: Payer: Self-pay | Admitting: Nurse Practitioner

## 2022-04-14 ENCOUNTER — Telehealth: Payer: Self-pay

## 2022-04-14 NOTE — Telephone Encounter (Signed)
Letter from [place] and work note from [employer] to patient's supervisor [email or web address]

## 2022-04-14 NOTE — Telephone Encounter (Signed)
Per patient's request, MR faxed to Dr. Jeannetta Nap [phone]

## 2022-04-15 LAB — IGP, APTIMA HPV: HPV Aptima: NEGATIVE

## 2022-04-16 ENCOUNTER — Encounter: Payer: Self-pay | Admitting: Nurse Practitioner

## 2022-04-16 ENCOUNTER — Telehealth: Payer: Self-pay | Admitting: Nurse Practitioner

## 2022-04-16 NOTE — Telephone Encounter (Signed)
Discussed recent labs and imaging with patient over the phone.  Pap smear was normal and HPV negative Will call Dr. Garwin Brothers office to discuss patient and urgent need for evaluation by GYN. Dr. Garwin Brothers Phone [phone] Fax # [phone]

## 2022-04-21 ENCOUNTER — Telehealth: Payer: Self-pay | Admitting: Nurse Practitioner

## 2022-04-21 NOTE — Telephone Encounter (Signed)
Error

## 2022-04-21 NOTE — Telephone Encounter (Signed)
Emailed 04/08/22-04/13/22 work note to Engineer, technical sales. Put copy @ front desk for patient's mother to p/u-Toni

## 2022-04-22 ENCOUNTER — Encounter (HOSPITAL_COMMUNITY): Payer: Self-pay | Admitting: Emergency Medicine

## 2022-04-22 ENCOUNTER — Emergency Department (HOSPITAL_COMMUNITY)
Admission: EM | Admit: 2022-04-22 | Discharge: 2022-04-23 | Disposition: A | Discharge status: Home / Self Care | Payer: 59 | Attending: Emergency Medicine | Admitting: Emergency Medicine

## 2022-04-22 ENCOUNTER — Encounter: Payer: Self-pay | Admitting: Nurse Practitioner

## 2022-04-22 DIAGNOSIS — R197 Diarrhea, unspecified: Secondary | ICD-10-CM | POA: Insufficient documentation

## 2022-04-22 DIAGNOSIS — N9489 Other specified conditions associated with female genital organs and menstrual cycle: Secondary | ICD-10-CM | POA: Insufficient documentation

## 2022-04-22 DIAGNOSIS — R112 Nausea with vomiting, unspecified: Secondary | ICD-10-CM | POA: Insufficient documentation

## 2022-04-22 DIAGNOSIS — R079 Chest pain, unspecified: Secondary | ICD-10-CM | POA: Insufficient documentation

## 2022-04-22 DIAGNOSIS — R1013 Epigastric pain: Secondary | ICD-10-CM | POA: Insufficient documentation

## 2022-04-22 LAB — COMPREHENSIVE METABOLIC PANEL
ALT: 20 U/L (ref 0–44)
AST: 23 U/L (ref 15–41)
Albumin: 4.5 g/dL (ref 3.5–5.0)
Alkaline Phosphatase: 52 U/L (ref 38–126)
Anion gap: 8 (ref 5–15)
BUN: 8 mg/dL (ref 6–20)
CO2: 24 mmol/L (ref 22–32)
Calcium: 9.6 mg/dL (ref 8.9–10.3)
Chloride: 106 mmol/L (ref 98–111)
Creatinine, Ser: 0.61 mg/dL (ref 0.44–1.00)
GFR, Estimated: >60 mL/min (ref >60)
Glucose, Bld: 87 mg/dL (ref 70–99)
Potassium: 3.8 mmol/L (ref 3.5–5.1)
Sodium: 138 mmol/L (ref 135–145)
Total Bilirubin: 0.5 mg/dL (ref 0.3–1.2)
Total Protein: 7.6 g/dL (ref 6.5–8.1)

## 2022-04-22 LAB — CBC
HCT: 37.9 % (ref 36.0–46.0)
Hemoglobin: 13 g/dL (ref 12.0–15.0)
MCH: 33.1 pg (ref 26.0–34.0)
MCHC: 34.3 g/dL (ref 30.0–36.0)
MCV: 96.4 fL (ref 80.0–100.0)
Platelets: 253 10*3/uL (ref 150–400)
RBC: 3.93 MIL/uL (ref 3.87–5.11)
RDW: 12.8 % (ref 11.5–15.5)
WBC: 6.8 10*3/uL (ref 4.0–10.5)
nRBC: 0 % (ref 0.0–0.2)

## 2022-04-22 LAB — LIPASE, BLOOD: Lipase: 26 U/L (ref 11–51)

## 2022-04-22 LAB — I-STAT BETA HCG BLOOD, ED (MC, WL, AP ONLY): I-stat hCG, quantitative: <5 m[IU]/mL (ref <5)

## 2022-04-22 NOTE — Telephone Encounter (Signed)
sure

## 2022-04-22 NOTE — Telephone Encounter (Signed)
Is there 140

## 2022-04-22 NOTE — ED Triage Notes (Signed)
Pt endorses chest and abd burning. Reports decreased appetite and unable to keep anything down. Pain worse at night where pt cannot sleep. Reports constant diarrhea.

## 2022-04-22 NOTE — ED Provider Triage Note (Signed)
Emergency Medicine Provider Triage Evaluation Note  Mariah Bird , a 36 y.o. female  was evaluated in triage.  Pt complains of abd pain NV for a "few weeks" was seen at [facility] and had reassuring workup -- treated for PID. Sx have continued.  Review of Systems  Positive: Abd pain, NV Negative: Fever   Physical Exam  BP 121/82 (BP Location: Right Arm)   Pulse 60   Temp 98.5 F (36.9 C) (Oral)   Resp 18   Ht '5\' 1"'$  (1.549 m)   Wt 40 kg   LMP 04/05/2022   SpO2 98%   BMI 16.66 kg/m  Gen:   Awake, no distress   Resp:  Normal effort  MSK:   Moves extremities without difficulty  Other:  Abd soft nttp  Medical Decision Making  Medically screening exam initiated at 5:48 PM.  Appropriate orders placed.  Mariah Bird was informed that the remainder of the evaluation will be completed by another provider, this initial triage assessment does not replace that evaluation, and the importance of remaining in the ED until their evaluation is complete.  [address]    Mariah Bird [place], [place] 04/22/22 [phone]

## 2022-04-23 ENCOUNTER — Telehealth: Payer: 59 | Admitting: Nurse Practitioner

## 2022-04-23 ENCOUNTER — Ambulatory Visit: Payer: 59 | Admitting: Internal Medicine

## 2022-04-23 LAB — URINALYSIS, ROUTINE W REFLEX MICROSCOPIC
Bilirubin Urine: NEGATIVE
Glucose, UA: NEGATIVE mg/dL
Hgb urine dipstick: NEGATIVE
Ketones, ur: 80 mg/dL — AB
Leukocytes,Ua: NEGATIVE
Nitrite: NEGATIVE
Protein, ur: NEGATIVE mg/dL
Specific Gravity, Urine: 1.016 (ref 1.005–1.030)
pH: 7 (ref 5.0–8.0)

## 2022-04-23 MED ORDER — LOPERAMIDE HCL 2 MG PO CAPS: 2.0000 mg | ORAL_CAPSULE | Freq: Four times a day (QID) | ORAL | 0 refills | Status: AC | PRN

## 2022-04-23 MED ORDER — ALUM & MAG HYDROXIDE-SIMETH 200-200-20 MG/5ML PO SUSP
30.0000 mL | Freq: Once | ORAL | Status: AC
  Administered 2022-04-23: 30 mL via ORAL
  Filled 2022-04-23: qty 30

## 2022-04-23 MED ORDER — ONDANSETRON 4 MG PO TBDP: 4.0000 mg | ORAL_TABLET | Freq: Three times a day (TID) | ORAL | 0 refills | Status: AC | PRN

## 2022-04-23 MED ORDER — PANTOPRAZOLE SODIUM 40 MG PO TBEC: 40.0000 mg | DELAYED_RELEASE_TABLET | Freq: Every day | ORAL | 0 refills | Status: DC

## 2022-04-23 MED ORDER — PANTOPRAZOLE SODIUM 40 MG IV SOLR
40.0000 mg | Freq: Once | INTRAVENOUS | Status: AC
  Administered 2022-04-23: 40 mg via INTRAVENOUS
  Filled 2022-04-23: qty 10

## 2022-04-23 MED ORDER — ONDANSETRON HCL 4 MG/2ML IJ SOLN
4.0000 mg | Freq: Once | INTRAMUSCULAR | Status: AC
  Administered 2022-04-23: 4 mg via INTRAVENOUS
  Filled 2022-04-23: qty 2

## 2022-04-23 MED ORDER — MORPHINE SULFATE (PF) 2 MG/ML IV SOLN
2.0000 mg | Freq: Once | INTRAVENOUS | Status: AC
  Administered 2022-04-23: 2 mg via INTRAVENOUS
  Filled 2022-04-23: qty 1

## 2022-04-23 NOTE — ED Provider Notes (Signed)
[facility] EMERGENCY DEPARTMENT Provider Note   CSN: [id number] Arrival date & time: 04/22/22  1713     History  Chief Complaint  Patient presents with   Abdominal Pain   Chest Pain    Mariah Bird is a 36 y.o. female with a past medical history of vitamin D deficiency presenting today with epigastric pain.  Describes it as "burning."  Denies NSAID use or alcohol use.  Says that she used to drink a lot but no longer does.  No history of PUD.  Says that she has been nauseated and had episodes of emesis and is unable to keep down any food or medications.  Says that this has been going on for "a while."  Says that GI is unable to get her in until the end of October and she "has to know what is going on."  She says she is here to see gastroenterology sooner because she cannot wait until October.   Abdominal Pain Associated symptoms: chest pain, diarrhea, nausea and vomiting   Associated symptoms: no chills and no fever   Chest Pain Associated symptoms: abdominal pain, nausea and vomiting   Associated symptoms: no fever        Home Medications Prior to Admission medications   Medication Sig Start Date End Date Taking? Authorizing Provider  citalopram (CELEXA) 20 MG tablet Take 1 tablet (20 mg total) by mouth daily. 11/20/21   Jonetta Osgood, NP  diphenoxylate-atropine (LOMOTIL) 2.5-0.025 MG tablet Take 1 tablet by mouth 4 (four) times daily as needed for diarrhea or loose stools. 11/20/21   Jonetta Osgood, NP  doxycycline (ADOXA) 100 MG tablet Take 1 tablet (100 mg total) by mouth 2 (two) times daily for 14 days. 04/13/22 04/27/22  Rada Hay, MD  dronabinol (MARINOL) 5 MG capsule Take 1 capsule (5 mg total) by mouth 2 (two) times daily before lunch and supper. Take 1 hour before each meal (except breakfast) 02/12/22   Jonetta Osgood, NP  megestrol (MEGACE) 40 MG tablet Take 2 tablets (80 mg total) by mouth 2 (two) times daily. 11/20/21   Jonetta Osgood,  NP  metroNIDAZOLE (FLAGYL) 500 MG tablet Take 1 tablet (500 mg total) by mouth 2 (two) times daily for 14 days. 04/13/22 04/27/22  Rada Hay, MD  ondansetron (ZOFRAN ODT) 4 MG disintegrating tablet '4mg'$  ODT q4 hours prn nausea/vomit 11/20/21   Jonetta Osgood, NP  traZODone (DESYREL) 50 MG tablet Take 0.5-1 tablets (25-50 mg total) by mouth at bedtime as needed for sleep. 11/20/21   Jonetta Osgood, NP  Vitamin D, Ergocalciferol, (DRISDOL) 1.25 MG (50000 UNIT) CAPS capsule Take 1 capsule (50,000 Units total) by mouth every 7 (seven) days. 11/20/21   Jonetta Osgood, NP      Allergies    Patient has no known allergies.    Review of Systems   Review of Systems  Constitutional:  Positive for unexpected weight change. Negative for chills and fever.  Cardiovascular:  Positive for chest pain.  Gastrointestinal:  Positive for abdominal pain, diarrhea, nausea and vomiting.    Physical Exam Updated Vital Signs BP 118/79 (BP Location: Right Arm)   Pulse (!) 54   Temp 98.6 F (37 C) (Oral)   Resp 14   Ht '5\' 1"'$  (1.549 m)   Wt 40 kg   LMP 04/05/2022   SpO2 100%   BMI 16.66 kg/m  Physical Exam Vitals and nursing note reviewed.  Constitutional:      Appearance: Normal appearance.  Comments: Chronically ill-appearing, underweight  HENT:     Head: Normocephalic and atraumatic.  Eyes:     General: No scleral icterus.    Conjunctiva/sclera: Conjunctivae normal.  Pulmonary:     Effort: Pulmonary effort is normal. No respiratory distress.  Abdominal:     General: Abdomen is flat.     Palpations: Abdomen is soft.     Tenderness: There is abdominal tenderness in the epigastric area.  Skin:    Findings: No rash.  Neurological:     Mental Status: She is alert.  Psychiatric:        Mood and Affect: Mood normal.     ED Results / Procedures / Treatments   Labs (all labs ordered are listed, but only abnormal results are displayed) Labs Reviewed  URINALYSIS, ROUTINE W REFLEX  MICROSCOPIC - Abnormal; Notable for the following components:      Result Value   APPearance CLOUDY (*)    Ketones, ur 80 (*)    Bacteria, UA RARE (*)    All other components within normal limits  LIPASE, BLOOD  COMPREHENSIVE METABOLIC PANEL  CBC  I-STAT BETA HCG BLOOD, ED ([facility], [facility], [facility] ONLY)     EKG EKG Interpretation  Date/Time:  Wednesday April 22 2022 17:20:39 EDT Ventricular Rate:  58 PR Interval:  130 QRS Duration: 74 QT Interval:  398 QTC Calculation: 390 R Axis:   77 Text Interpretation: Sinus bradycardia with sinus arrhythmia Otherwise normal ECG Confirmed by Ripley Fraise [phone]) on 04/23/2022 3:57:07 AM  Radiology No results found.  Procedures Procedures   Medications Ordered in ED Medications  alum & mag hydroxide-simeth (MAALOX/MYLANTA) 200-200-20 MG/5ML suspension 30 mL (30 mLs Oral Given 04/23/22 0511)  pantoprazole (PROTONIX) injection 40 mg (40 mg Intravenous Given 04/23/22 0504)  morphine (PF) 2 MG/ML injection 2 mg (2 mg Intravenous Given 04/23/22 0504)  ondansetron (ZOFRAN) injection 4 mg (4 mg Intravenous Given 04/23/22 0503)    ED Course/ Medical Decision Making/ A&P                           Medical Decision Making Risk OTC drugs. Prescription drug management.   This is a 36 year-old female who presents to the ED for concern of epigastric tenderness.  Differential includes but is not limited to pancreatitis, cholecystitis, cholelithiasis, PUD, GERD and gastritis.    This is not an exhaustive differential.    Past Medical History / Co-morbidities / Social History: Anxiety, depression, and chronic abdominal pain.   Additional history: I reviewed patient's visit at [facility] 1 week ago for pelvic pain.  At that time she had a negative abdominal work-up.  Was noted to have some increased vascularity in the vagina.  Her pelvic exam revealed cervical motion tenderness as well as a potential ulceration on her cervix.  She was treated for PID  empirically.  Per further chart review, patient has been seen for this same complaint multiple times since the fall 2022.   Physical Exam: Pertinent physical exam findings include Shaking and discomfort, epigastric tenderness  Lab Tests: I ordered, and personally interpreted labs.  The pertinent results include: Negative labs   Imaging Studies: I considered RUQ ultrasound and CT abdomen pelvis but she has had imaging within the past 10 days.   Medications: I ordered medication including Zofran, morphine and Protonix. Reevaluation of the patient after these medicines showed that the patient improved. I have reviewed the patients home medicines and have made  adjustments as needed.    MDM/Disposition: This is a 36 year old female presenting today with chronic epigastric pain.  Appears to be an acute flareup.  No history of IBS/IBD.  Has seen GI many years ago for this but is unable to get them until the end of October.  Work-up negative.  Imaging was not repeated from a week ago, I did not believe this is indicated and the patient agreed.  After being given Maalox, Protonix, Zofran and morphine patient felt better and is agreeable to discharge at this time.  I put in an ambulatory gastroenterology referral, hopefully this will get her into see somebody sooner.  Also given her contact information for [facility].  At this time there is no indication for admission.  She does not appear to have any emergent findings requiring admission today.  Normal vital signs, afebrile, not tachycardic and normal white count.  She will be discharged home at this time.  Needs to see GI and likely have endoscopy/colonoscopy.  No personal history of IBD/IBS, suspect this may be present to some degree and be contributed to unintentional weight loss and malnutrition.  No other explanations on her lab work or CT scan.  She is agreeable to outpatient follow-up.    I discussed this case with my attending physician Dr.  Christy Gentles who cosigned this note including patient's presenting symptoms, physical exam, and planned diagnostics and interventions. Attending physician stated agreement with plan or made changes to plan which were implemented.      Final Clinical Impression(s) / ED Diagnoses Final diagnoses:  Abdominal pain, epigastric    Rx / DC Orders ED Discharge Orders          Ordered    pantoprazole (PROTONIX) 40 MG tablet  Daily        04/23/22 0411    ondansetron (ZOFRAN-ODT) 4 MG disintegrating tablet  Every 8 hours PRN        04/23/22 0411    Ambulatory referral to Gastroenterology        04/23/22 0511           Results and diagnoses were explained to the patient. Return precautions discussed in full. Patient had no additional questions and expressed complete understanding.   This chart was dictated using voice recognition software.  Despite best efforts to proofread,  errors can occur which can change the documentation meaning.    Rhae Hammock, PA-C 04/23/22 0557    Ripley Fraise, MD 04/23/22 [phone]

## 2022-04-23 NOTE — Telephone Encounter (Signed)
I noticed pt was given doxycycline

## 2022-04-23 NOTE — Discharge Instructions (Addendum)
I have put in a referral to GI.  Look out for phone call from them in the next 72 hours.  If you do not hear from them, please call the [place] office and ask for sooner appointment as an " emergency department follow-up."  This may help you get in sooner.  The medications I sent to your pharmacy are as follows: Pantoprazole.  This is something we used to treat ulcers.  It should be taken in the morning, on an empty stomach.  It is best to take this 30 to 60 minutes before eating breakfast Ondansetron dissolvable pill.  This is for nausea as needed Loperamide is a medication that you may use for diarrhea as needed Maalox is a medication that you may buy over-the-counter.  It is used to coat your stomach and hopefully relieve some discomfort.  Please return with any worsening pain however your labs and imaging over the last few weeks have all been normal.  It is important that you see GI for a likely endoscopy.  It was a pleasure to meet you and we hope you feel better!  Your work note is attached.

## 2022-04-30 ENCOUNTER — Encounter: Payer: Self-pay | Admitting: Nurse Practitioner

## 2022-05-06 ENCOUNTER — Telehealth: Payer: Self-pay | Admitting: Nurse Practitioner

## 2022-05-06 NOTE — Telephone Encounter (Signed)
Received call from [facility] requesting verification of work excuse. She stated it looks like return date had been altered. I verified original dates were from 04/08/22 with a return back to work on 04/13/22-Toni

## 2022-05-17 DIAGNOSIS — Z8719 Personal history of other diseases of the digestive system: Secondary | ICD-10-CM

## 2022-05-17 HISTORY — DX: Personal history of other diseases of the digestive system: Z87.19

## 2022-05-17 HISTORY — PX: COLONOSCOPY WITH ESOPHAGOGASTRODUODENOSCOPY (EGD): SHX5779

## 2022-05-18 ENCOUNTER — Ambulatory Visit: Payer: 59 | Admitting: Surgery

## 2022-05-18 ENCOUNTER — Other Ambulatory Visit: Payer: Self-pay

## 2022-05-18 ENCOUNTER — Encounter: Payer: Self-pay | Admitting: Surgery

## 2022-05-18 VITALS — BP 108/72 | HR 64 | Temp 98.4°F | Ht 61.0 in | Wt 82.4 lb

## 2022-05-18 DIAGNOSIS — R2241 Localized swelling, mass and lump, right lower limb: Secondary | ICD-10-CM | POA: Diagnosis not present

## 2022-05-18 DIAGNOSIS — K644 Residual hemorrhoidal skin tags: Secondary | ICD-10-CM | POA: Diagnosis not present

## 2022-05-18 DIAGNOSIS — R229 Localized swelling, mass and lump, unspecified: Secondary | ICD-10-CM

## 2022-05-18 MED ORDER — HYDROCORTISONE (PERIANAL) 2.5 % EX CREA: 1.0000 | TOPICAL_CREAM | Freq: Two times a day (BID) | CUTANEOUS | 1 refills | Status: DC

## 2022-05-18 MED ORDER — LIDOCAINE 5 % EX OINT: 1.0000 | TOPICAL_OINTMENT | CUTANEOUS | 0 refills | Status: DC | PRN

## 2022-05-18 NOTE — Progress Notes (Signed)
05/18/2022  Reason for Visit: Right hip nodule and external hemorrhoid  Requesting Provider: Jonetta Osgood, NP  History of Present Illness: Mariah Bird is a 36 y.o. female presenting for evaluation of skin nodule in the right lateral hip as well as external hemorrhoid.  With regard to the skin nodule, the patient reports that she has noticed this about 3 to 4 months ago and feels that it is becoming tender and increasing in spread.  The patient reports that initially she felt that there was a small pea-sized lump in the right lateral hip.  This has been having tenderness recently to palpation and she is also noticed dimpling of the skin and skin discoloration.  This was seen on a CT scan of the abdomen pelvis on 04/05/2022 when the patient presented to emergency room for abdominal pain.  CT scan noted a 1.3 cm right gluteal subcutaneous nodule which could be related to a contusion or inflammation.  No abscess noted in the area.  The patient also reports that she has issues with hemorrhoids that been ongoing for at least a year.  She reports that she will have some frequent flareups where she will have a combination of symptoms.  Mostly she has pain at the outside of the anal canal and a sensation of tissue protruding as well as itchiness.  She reports some blood that she notes in the toilet paper but not in the toilet bowl.  She has tried Preparation H and Tucks pads but has not been treated with any other prescription medications.  She denies any constipation but reports more issues with diarrhea or loose stool.  She has been having weight loss issues as well and she is going to have a colonoscopy and upper endoscopy this week for further evaluation of this.  Past Medical History: Past Medical History:  Diagnosis Date   Anxiety    Depression    Hyperthyroidism    Nephrolithiasis      Past Surgical History: Past Surgical History:  Procedure Laterality Date   KNEE ARTHROSCOPY Right     NASAL SINUS SURGERY  12/18/2016    Home Medications: Prior to Admission medications   Medication Sig Start Date End Date Taking? Authorizing Provider  citalopram (CELEXA) 20 MG tablet Take 1 tablet (20 mg total) by mouth daily. 11/20/21  Yes Abernathy, Yetta Flock, NP  diphenoxylate-atropine (LOMOTIL) 2.5-0.025 MG tablet Take 1 tablet by mouth 4 (four) times daily as needed for diarrhea or loose stools. 11/20/21  Yes Abernathy, Yetta Flock, NP  dronabinol (MARINOL) 5 MG capsule Take 1 capsule (5 mg total) by mouth 2 (two) times daily before lunch and supper. Take 1 hour before each meal (except breakfast) 02/12/22  Yes Abernathy, Alyssa, NP  hydrocortisone (ANUSOL-HC) 2.5 % rectal cream Place 1 Application rectally 2 (two) times daily. 05/18/22  Yes Demiya Magno, MD  lidocaine (XYLOCAINE) 5 % ointment Apply 1 Application topically as needed. 05/18/22  Yes Rhys Lichty, Jacqulyn Bath, MD  loperamide (IMODIUM) 2 MG capsule Take 1 capsule (2 mg total) by mouth 4 (four) times daily as needed for diarrhea or loose stools. 04/23/22  Yes Redwine, Madison A, PA-C  megestrol (MEGACE) 40 MG tablet Take 2 tablets (80 mg total) by mouth 2 (two) times daily. 11/20/21  Yes Abernathy, Alyssa, NP  ondansetron (ZOFRAN-ODT) 4 MG disintegrating tablet Take 1 tablet (4 mg total) by mouth every 8 (eight) hours as needed for nausea or vomiting. 04/23/22  Yes Redwine, Madison A, PA-C  pantoprazole (PROTONIX) 40 MG tablet Take  1 tablet (40 mg total) by mouth daily. 04/23/22 05/23/22 Yes Redwine, Madison A, PA-C  traZODone (DESYREL) 50 MG tablet Take 0.5-1 tablets (25-50 mg total) by mouth at bedtime as needed for sleep. 11/20/21  Yes Abernathy, Yetta Flock, NP  Vitamin D, Ergocalciferol, (DRISDOL) 1.25 MG (50000 UNIT) CAPS capsule Take 1 capsule (50,000 Units total) by mouth every 7 (seven) days. 11/20/21  Yes Abernathy, Yetta Flock, NP    Allergies: No Known Allergies  Social History:  reports that she has never smoked. She has never used smokeless tobacco. She  reports that she does not currently use alcohol. She reports current drug use. Drug: Marijuana.   Family History: Family History  Problem Relation Age of Onset   Lupus Sister    Kidney disease Maternal Uncle    Kidney Stones Paternal Grandmother    Bladder Cancer Neg Hx    Kidney cancer Neg Hx    Prostate cancer Neg Hx     Review of Systems: Review of Systems  Constitutional:  Negative for chills and fever.  Respiratory:  Negative for shortness of breath.   Cardiovascular:  Negative for chest pain.  Gastrointestinal:  Positive for diarrhea. Negative for abdominal pain, nausea and vomiting.  Genitourinary:  Negative for dysuria.  Musculoskeletal:  Negative for myalgias.  Skin:        Right lateral hip skin discoloration and nodule.  Neurological:  Negative for dizziness.  Psychiatric/Behavioral:  Negative for depression.     Physical Exam BP 108/72 (BP Location: Left Arm, Patient Position: Sitting, Cuff Size: Small)   Pulse 64   Temp 98.4 F (36.9 C) (Oral)   Ht '5\' 1"'$  (1.549 m)   Wt 82 lb 6.4 oz (37.4 kg)   LMP 04/28/2022   SpO2 99%   BMI 15.57 kg/m  CONSTITUTIONAL: No acute distress HEENT:  Normocephalic, atraumatic, extraocular motion intact. NECK: Trachea is midline, and there is no jugular venous distension.  RESPIRATORY:  Normal respiratory effort without pathologic use of accessory muscles. CARDIOVASCULAR: Regular rhythm and rate. RECTAL: External exam reveals an enlarged right posterior external hemorrhoid which is soft, noninflamed, and nontender.  The other 2 components are not enlarged externally.  On digital rectal exam, the internal right posterior component is also mildly enlarged but the other 2 are normal in size.  No gross blood. MUSCULOSKELETAL:  Normal muscle strength and tone in all four extremities.  No peripheral edema or cyanosis. SKIN: The patient has an area of discoloration in the right lateral hip measuring about 3 x 2.5 cm in size with skin tone  lighter in color compared to the surrounding skin.  This area also appears to be dimpled or sunken in relative to the rest of the skin surface.  Within this area centrally, there is a small 5 mm size nodule deeper to the skin which is tender to palpation.  There is no drainage, erythema, or induration. NEUROLOGIC:  Motor and sensation is grossly normal.  Cranial nerves are grossly intact. PSYCH:  Alert and oriented to person, place and time. Affect is normal.  Laboratory Analysis: Labs from 04/22/2022: Sodium 138, potassium 3.8, chloride 106, CO2 24, BUN 8, creatinine 0.61.  LFTs within normal.  WBC 6.8, hemoglobin 13, hematocrit 37.9, platelets 253.  Imaging: CT abdomen/pelvis on 04/05/2022: IMPRESSION: There is no evidence of intestinal obstruction or pneumoperitoneum. There is no hydronephrosis.   There is wall thickening and increased vascularity in the vagina suggesting possible inflammatory/infectious process. There is no loculated fluid collection in or adjacent  to the vagina.   There is small linear area of faint increased density in subcutaneous plane in the right gluteal region suggesting possible small area of contusion or inflammation. There is no abnormal loculated fluid collection in the right gluteal region.  Assessment and Plan: This is a 36 y.o. female with a right lateral hip nodule and skin discoloration as well as external/internal hemorrhoids.  - Discussed with patient that to me it is unclear uncertain the etiology for the skin discoloration and nodule that she has palpable on the right lateral hip.  Unclear why this would be causing dimpling of the skin unless it is more related to scar tissue that is contracting.  I think it would be best to refer the patient to dermatology clinic in order to evaluate this further. - With regards to the hemorrhoids, the patient does have an enlarged external and internal hemorrhoid of the right posterior colon.  Discussed with the  patient that we can start conservative measures during the episodes of flareups.  Discussed with her the role for fiber supplementation to her diet to reduce the amount of diarrhea or loose stools that she has currently which is likely irritating the hemorrhoid tissue without the frequent wiping.  Also discussed the role for sitz bath, Anusol ointment, lidocaine ointment.  Discussed with patient that typically we will try to do conservative measures first rather than going to surgery right away due to the potential pain issues after surgery.  Discussed with her that the banding procedure is more reserved for internal hemorrhoid issues only but with external hemorrhoid, banding this will also cause significant pain.  Patient understands this plan.  We will give her a prescription for Anusol and lidocaine that she can use during flareups.  She can start doing now sitz bath's to help with cleanliness and hygiene particular with the itchiness that she is describing could be related to residual stool from the external hemorrhoid area.  She can also start using fiber supplement and reserve the Anusol and lidocaine for the flareup episodes. - Patient will follow-up with me in about a month to assess her progress and her symptoms.  I spent 40 minutes dedicated to the care of this patient on the date of this encounter to include pre-visit review of records, face-to-face time with the patient discussing diagnosis and management, and any post-visit coordination of care.   Melvyn Neth, [facility]

## 2022-05-18 NOTE — Patient Instructions (Addendum)
Referral to Dermatology. Someone from their office will call to schedule an appointment. If you do not hear from anyone within 5-7 days you may try calling their office.   Avoid constipation and Diarrhea.   Advised to pursue a goal of 25 to 30 g of fiber daily.  Made aware that the majority of this may be through natural sources, but advised to be aware of actual consumption and to ensure minimal consumption by daily supplementation.  Various forms of supplements discussed.  Recommended Psyllium husk, that mixes well with applesauce, or the powder which goes down well shaken with chocolate milk.  Strongly advised to consume more fluids to ensure adequate hydration, instructed to watch color of urine to determine adequacy of hydration.  Clarity is pursued in urine output, and bowel activity that correlates to significant meal intake.   We need to avoid deferring having bowel movements, advised to take the time at the first sign of sensation, typically following meals, and in the morning.   Subsequent utilization of MiraLAX may be needed ensure at least daily movement, ideally twice daily bowel movements.  If multiple doses of MiraLAX are necessary utilize them. Never skip a day...  To be regular, we must do the above EVERY day.       How to Take a [employer] A sitz bath is a warm water bath that may be used to care for your rectum, genital area, or the area between your rectum and genitals (perineum). In a sitz bath, the water only comes up to your hips and covers your buttocks. A sitz bath may be done in a bathtub or with a portable sitz bath that fits over the toilet. Your health care provider may recommend a sitz bath to help: Relieve pain and discomfort after delivering a baby. Relieve pain and itching from hemorrhoids or anal fissures. Relieve pain after certain surgeries. Relax muscles that are sore or tight. How to take a sitz bath Take 2-4 sitz baths a day, or as many as told by your  health care provider. Bathtub sitz bath To take a sitz bath in a bathtub: Partially fill a bathtub with warm water. The water should be deep enough to cover your hips and buttocks when you are sitting in the bathtub. Follow your health care provider's instructions if you are told to put medicine in the water. Sit in the water. Open the bathtub drain a little, and leave it open during your bath. Turn on the warm water again, enough to replace the water that is draining out. Keep the water running throughout your bath. This helps keep the water at the right level and temperature. Soak in the water for 15-20 minutes, or as long as told by your health care provider. When you are done, be careful when you stand up. You may feel dizzy. After the sitz bath, pat yourself dry. Do not rub your skin to dry it.  Over-the-toilet sitz bath To take a sitz bath with an over-the-toilet basin: Follow the manufacturer's instructions. Fill the basin with warm water. Follow your health care provider's instructions if you were told to put medicine in the water. Sit on the seat. Make sure the water covers your buttocks and perineum. Soak in the water for 15-20 minutes, or as long as told by your health care provider. After the sitz bath, pat yourself dry. Do not rub your skin to dry it. Clean and dry the basin between uses. Discard the basin if it cracks,  or according to the manufacturer's instructions.  Contact a health care provider if: Your pain or itching gets worse. Stop doing sitz baths if your symptoms get worse. You have new symptoms. Stop doing sitz baths until you talk with your health care provider. Summary A sitz bath is a warm water bath in which the water only comes up to your hips and covers your buttocks. Your health care provider may recommend a sitz bath to help relieve pain and discomfort after delivering a baby, relieve pain and itching from hemorrhoids or anal fissures, relieve pain after  certain surgeries, or help to relax muscles that are sore or tight. Take 2-4 sitz baths a day, or as many as told by your health care provider. Soak in the water for 15-20 minutes. Stop doing sitz baths if your symptoms get worse. This information is not intended to replace advice given to you by your health care provider. Make sure you discuss any questions you have with your health care provider. Document Revised: 11/04/2021 Document Reviewed: 11/04/2021 Elsevier Patient Education  [place]. Hemorrhoids Hemorrhoids are swollen veins in and around the rectum or anus. There are two types of hemorrhoids: Internal hemorrhoids. These occur in the veins that are just inside the rectum. They may poke through to the outside and become irritated and painful. External hemorrhoids. These occur in the veins that are outside the anus and can be felt as a painful swelling or hard lump near the anus. Most hemorrhoids do not cause serious problems, and they can be managed with home treatments such as diet and lifestyle changes. If home treatments do not help the symptoms, procedures can be done to shrink or remove the hemorrhoids. What are the causes? This condition is caused by increased pressure in the anal area. This pressure may result from various things, including: Constipation. Straining to have a bowel movement. Diarrhea. Pregnancy. Obesity. Sitting for long periods of time. Heavy lifting or other activity that causes you to strain. Anal sex. Riding a bike for a long period of time. What are the signs or symptoms? Symptoms of this condition include: Pain. Anal itching or irritation. Rectal bleeding. Leakage of stool (feces). Anal swelling. One or more lumps around the anus. How is this diagnosed? This condition can often be diagnosed through a visual exam. Other exams or tests may also be done, such as: An exam that involves feeling the rectal area with a gloved hand (digital  rectal exam). An exam of the anal canal that is done using a small tube (anoscope). A blood test, if you have lost a significant amount of blood. A test to look inside the colon using a flexible tube with a camera on the end (sigmoidoscopy or colonoscopy). How is this treated? This condition can usually be treated at home. However, various procedures may be done if dietary changes, lifestyle changes, and other home treatments do not help your symptoms. These procedures can help make the hemorrhoids smaller or remove them completely. Some of these procedures involve surgery, and others do not. Common procedures include: Rubber band ligation. Rubber bands are placed at the base of the hemorrhoids to cut off their blood supply. Sclerotherapy. Medicine is injected into the hemorrhoids to shrink them. Infrared coagulation. A type of light energy is used to get rid of the hemorrhoids. Hemorrhoidectomy surgery. The hemorrhoids are surgically removed, and the veins that supply them are tied off. Stapled hemorrhoidopexy surgery. The surgeon staples the base of the hemorrhoid to the  rectal wall. Follow these instructions at home: Eating and drinking  Eat foods that have a lot of fiber in them, such as whole grains, beans, nuts, fruits, and vegetables. Ask your health care provider about taking products that have added fiber (fiber supplements). Reduce the amount of fat in your diet. You can do this by eating low-fat dairy products, eating less red meat, and avoiding processed foods. Drink enough fluid to keep your urine pale yellow. Managing pain and swelling  Take warm sitz baths for 20 minutes, 3-4 times a day to ease pain and discomfort. You may do this in a bathtub or using a portable sitz bath that fits over the toilet. If directed, apply ice to the affected area. Using ice packs between sitz baths may be helpful. Put ice in a plastic bag. Place a towel between your skin and the bag. Leave the ice  on for 20 minutes, 2-3 times a day. General instructions Take over-the-counter and prescription medicines only as told by your health care provider. Use medicated creams or suppositories as told. Get regular exercise. Ask your health care provider how much and what kind of exercise is best for you. In general, you should do moderate exercise for at least 30 minutes on most days of the week (150 minutes each week). This can include activities such as walking, biking, or yoga. Go to the bathroom when you have the urge to have a bowel movement. Do not wait. Avoid straining to have bowel movements. Keep the anal area dry and clean. Use wet toilet paper or moist towelettes after a bowel movement. Do not sit on the toilet for long periods of time. This increases blood pooling and pain. Keep all follow-up visits as told by your health care provider. This is important. Contact a health care provider if you have: Increasing pain and swelling that are not controlled by treatment or medicine. Difficulty having a bowel movement, or you are unable to have a bowel movement. Pain or inflammation outside the area of the hemorrhoids. Get help right away if you have: Uncontrolled bleeding from your rectum. Summary Hemorrhoids are swollen veins in and around the rectum or anus. Most hemorrhoids can be managed with home treatments such as diet and lifestyle changes. Taking warm sitz baths can help ease pain and discomfort. In severe cases, procedures or surgery can be done to shrink or remove the hemorrhoids. This information is not intended to replace advice given to you by your health care provider. Make sure you discuss any questions you have with your health care provider. Document Revised: 02/12/2021 Document Reviewed: 02/12/2021 Elsevier Patient Education  [place].

## 2022-05-19 ENCOUNTER — Telehealth: Payer: Self-pay | Admitting: Surgery

## 2022-05-19 NOTE — Telephone Encounter (Signed)
Patient calls stating that she needs a work note for when she was here to see Dr. Hampton Abbot on 05/18/22.  Please call her when this is ready. Thank you.

## 2022-05-19 NOTE — Telephone Encounter (Signed)
Work note written and placed at the front desk for the patient. She will have someone pick this up for her.

## 2022-06-04 ENCOUNTER — Ambulatory Visit: Payer: 59 | Admitting: Dermatology

## 2022-06-04 DIAGNOSIS — R21 Rash and other nonspecific skin eruption: Secondary | ICD-10-CM | POA: Diagnosis not present

## 2022-06-04 DIAGNOSIS — L821 Other seborrheic keratosis: Secondary | ICD-10-CM | POA: Diagnosis not present

## 2022-06-04 DIAGNOSIS — D229 Melanocytic nevi, unspecified: Secondary | ICD-10-CM

## 2022-06-04 DIAGNOSIS — D2362 Other benign neoplasm of skin of left upper limb, including shoulder: Secondary | ICD-10-CM | POA: Diagnosis not present

## 2022-06-04 DIAGNOSIS — D225 Melanocytic nevi of trunk: Secondary | ICD-10-CM

## 2022-06-04 DIAGNOSIS — D492 Neoplasm of unspecified behavior of bone, soft tissue, and skin: Secondary | ICD-10-CM

## 2022-06-04 MED ORDER — MUPIROCIN 2 % EX OINT: 1.0000 | TOPICAL_OINTMENT | Freq: Every day | CUTANEOUS | 0 refills | Status: DC

## 2022-06-04 NOTE — Progress Notes (Signed)
   New Patient Visit  Subjective  Mariah Bird is a 36 y.o. female who presents for the following: Skin Problem (Patient here today for a new spot at right side, present for about 2 months. Spot is sensitive, discolored and has gotten larger. ).   The following portions of the chart were reviewed this encounter and updated as appropriate:   Tobacco  Allergies  Meds  Problems  Med Hx  Surg Hx  Fam Hx      Review of Systems:  No other skin or systemic complaints except as noted in HPI or Assessment and Plan.  Objective  Well appearing patient in no apparent distress; mood and affect are within normal limits.  A focused examination was performed including arms, neck, buttocks. Relevant physical exam findings are noted in the Assessment and Plan.  right neck 0.1 cm dark brown macule  right lateral buttock  right lateral buttock Atrophic hypopigmented patch with underlying approximately 0.5 cm subcutaneous papule       Assessment & Plan  Nevus right neck  Benign-appearing.  Observation.  Call clinic for new or changing lesions.  Recommend daily use of broad spectrum spf 30+ sunscreen to sun-exposed areas.     Rash and other nonspecific skin eruption right lateral buttock  Skin / nail biopsy - right lateral buttock Type of biopsy: punch   Informed consent: discussed and consent obtained   Timeout: patient name, date of birth, surgical site, and procedure verified   Procedure prep:  Patient was prepped and draped in usual sterile fashion Prep type:  Isopropyl alcohol Anesthesia: the lesion was anesthetized in a standard fashion   Anesthetic:  1% lidocaine w/ epinephrine 1-100,000 buffered w/ 8.4% NaHCO3 Punch size:  4 mm Suture size:  4-0 Suture type: Prolene (polypropylene)   Suture removal (days):  14 Hemostasis achieved with: suture   Outcome: patient tolerated procedure well   Post-procedure details: wound care instructions given   Additional details:   Petrolatum and a pressure dressing were applied  mupirocin ointment (BACTROBAN) 2 % - right lateral buttock Apply 1 Application topically daily.  Specimen 1 - Surgical pathology Differential Diagnosis: R/o LS et A vs morphea with underlying cyst or other  Check Margins: No Atrophic hypopigmented patch with underlying approximately 0.5 cm subcutaneous papule    Seborrheic Keratoses - Stuck-on, waxy, tan-brown papules and/or plaques left forearm - Benign-appearing - Discussed benign etiology and prognosis. - Observe - Call for any changes  Dermatofibroma - Firm pink/brown papulenodule with dimple sign at left forearm - Benign appearing - Call for any changes  Return in about 2 weeks (around 06/18/2022) for Suture Removal.  Graciella Belton, RMA, am acting as scribe for Forest Gleason, MD .  Documentation: I have reviewed the above documentation for accuracy and completeness, and I agree with the above.  Forest Gleason, MD

## 2022-06-04 NOTE — Patient Instructions (Signed)
Wound Care Instructions  Cleanse wound gently with soap and water once a day then pat dry with clean gauze. Apply a thin coat of Petrolatum (petroleum jelly, "Vaseline") over the wound (unless you have an allergy to this). We recommend that you use a new, sterile tube of Vaseline. Do not pick or remove scabs. Do not remove the yellow or white "healing tissue" from the base of the wound.  Cover the wound with fresh, clean, nonstick gauze and secure with paper tape. You may use Band-Aids in place of gauze and tape if the wound is small enough, but would recommend trimming much of the tape off as there is often too much. Sometimes Band-Aids can irritate the skin.  You should call the office for your biopsy report after 1 week if you have not already been contacted.  If you experience any problems, such as abnormal amounts of bleeding, swelling, significant bruising, significant pain, or evidence of infection, please call the office immediately.  FOR ADULT SURGERY PATIENTS: If you need something for pain relief you may take 1 extra strength Tylenol (acetaminophen) AND 2 Ibuprofen (200mg each) together every 4 hours as needed for pain. (do not take these if you are allergic to them or if you have a reason you should not take them.) Typically, you may only need pain medication for 1 to 3 days.    

## 2022-06-10 ENCOUNTER — Telehealth: Payer: Self-pay

## 2022-06-10 NOTE — Telephone Encounter (Signed)
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 Please also call [facility] and ask if they could see any changes c/w LSetA or morphea as well. Exam showed atrophic hypopigmented patch. Thank you!

## 2022-06-10 NOTE — Telephone Encounter (Signed)
Called [facility] and asked them to review again for changes with LSetA and morphea.   Called patient and discussed pathology results. If any changes after additional testing or review will discuss with her at appointment for suture removal.

## 2022-06-11 ENCOUNTER — Encounter: Payer: Self-pay | Admitting: Dermatology

## 2022-06-12 ENCOUNTER — Other Ambulatory Visit: Payer: Self-pay | Admitting: Nurse Practitioner

## 2022-06-12 DIAGNOSIS — E559 Vitamin D deficiency, unspecified: Secondary | ICD-10-CM

## 2022-06-12 MED ORDER — VITAMIN D (ERGOCALCIFEROL) 1.25 MG (50000 UNIT) PO CAPS: 50000.0000 [IU] | ORAL_CAPSULE | ORAL | 1 refills | Status: DC

## 2022-06-13 ENCOUNTER — Encounter: Payer: Self-pay | Admitting: Nurse Practitioner

## 2022-06-18 ENCOUNTER — Ambulatory Visit: Payer: 59 | Admitting: Dermatology

## 2022-06-18 DIAGNOSIS — M7989 Other specified soft tissue disorders: Secondary | ICD-10-CM | POA: Diagnosis not present

## 2022-06-18 NOTE — Progress Notes (Signed)
   Follow-Up Visit   Subjective  Mariah Bird is a 36 y.o. female who presents for the following: Suture / Staple Removal and follow-up.  The following portions of the chart were reviewed this encounter and updated as appropriate:   Tobacco  Allergies  Meds  Problems  Med Hx  Surg Hx  Fam Hx      Review of Systems:  No other skin or systemic complaints except as noted in HPI or Assessment and Plan.  Objective  Well appearing patient in no apparent distress; mood and affect are within normal limits.  A focused examination was performed including buttocks, face. Relevant physical exam findings are noted in the Assessment and Plan.  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  Offered patient ILK or oral prednisone to help with discomfort, patient defers tx today, will observe.   Encounter for Removal of Sutures - Incision site at the right lateral buttock is clean, dry and intact - Wound cleansed, sutures removed, wound cleansed and steri strips applied.  - Discussed pathology results showing NODULOCYSTIC FAT NECROSIS   - Patient advised to keep steri-strips dry until they fall off. - Scars remodel for a full year. - Once steri-strips fall off, patient can apply over-the-counter silicone scar cream each night to help with scar remodeling if desired. - Patient advised to call with any concerns or if they notice any new or changing lesions.  Return if symptoms worsen or fail to improve.  Graciella Belton, RMA, am acting as scribe for Forest Gleason, MD .  Documentation: I have reviewed the above documentation for accuracy and completeness, and I agree with the above.  Forest Gleason, MD

## 2022-06-18 NOTE — Patient Instructions (Signed)
Due to recent changes in healthcare laws, you may see results of your pathology and/or laboratory studies on MyChart before the doctors have had a chance to review them. We understand that in some cases there may be results that are confusing or concerning to you. Please understand that not all results are received at the same time and often the doctors may need to interpret multiple results in order to provide you with the best plan of care or course of treatment. Therefore, we ask that you please give us 2 business days to thoroughly review all your results before contacting the office for clarification. Should we see a critical lab result, you will be contacted sooner.   If You Need Anything After Your Visit  If you have any questions or concerns for your doctor, please call our main line at [phone] and press option 4 to reach your doctor's medical assistant. If no one answers, please leave a voicemail as directed and we will return your call as soon as possible. Messages left after 4 pm will be answered the following business day.   You may also send us a message via MyChart. We typically respond to MyChart messages within 1-2 business days.  For prescription refills, please ask your pharmacy to contact our office. Our fax number is [phone].  If you have an urgent issue when the clinic is closed that cannot wait until the next business day, you can page your doctor at the number below.    Please note that while we do our best to be available for urgent issues outside of office hours, we are not available 24/7.   If you have an urgent issue and are unable to reach us, you may choose to seek medical care at your doctor's office, retail clinic, urgent care center, or emergency room.  If you have a medical emergency, please immediately call 911 or go to the emergency department.  Pager Numbers  - Dr. Kowalski: [phone]  - Dr. Moye: [phone]  - Dr. Stewart:  [phone]  In the event of inclement weather, please call our main line at [phone] for an update on the status of any delays or closures.  Dermatology Medication Tips: Please keep the boxes that topical medications come in in order to help keep track of the instructions about where and how to use these. Pharmacies typically print the medication instructions only on the boxes and not directly on the medication tubes.   If your medication is too expensive, please contact our office at [phone] option 4 or send us a message through MyChart.   We are unable to tell what your co-pay for medications will be in advance as this is different depending on your insurance coverage. However, we may be able to find a substitute medication at lower cost or fill out paperwork to get insurance to cover a needed medication.   If a prior authorization is required to get your medication covered by your insurance company, please allow us 1-2 business days to complete this process.  Drug prices often vary depending on where the prescription is filled and some pharmacies may offer cheaper prices.  The website www.goodrx.com contains coupons for medications through different pharmacies. The prices here do not account for what the cost may be with help from insurance (it may be cheaper with your insurance), but the website can give you the price if you did not use any insurance.  - You can print the associated coupon and take it with   your prescription to the pharmacy.  - You may also stop by our office during regular business hours and pick up a GoodRx coupon card.  - If you need your prescription sent electronically to a different pharmacy, notify our office through [facility] MyChart or by phone at [phone] option 4.     Si Usted Necesita Algo Despus de Su Visita  Tambin puede enviarnos un mensaje a travs de MyChart. Por lo general respondemos a los mensajes de MyChart en el transcurso de 1 a 2  das hbiles.  Para renovar recetas, por favor pida a su farmacia que se ponga en contacto con nuestra oficina. Nuestro nmero de fax es el [phone].  Si tiene un asunto urgente cuando la clnica est cerrada y que no puede esperar hasta el siguiente da hbil, puede llamar/localizar a su doctor(a) al nmero que aparece a continuacin.   Por favor, tenga en cuenta que aunque hacemos todo lo posible para estar disponibles para asuntos urgentes fuera del horario de oficina, no estamos disponibles las 24 horas del da, los 7 das de la semana.   Si tiene un problema urgente y no puede comunicarse con nosotros, puede optar por buscar atencin mdica  en el consultorio de su doctor(a), en una clnica privada, en un centro de atencin urgente o en una sala de emergencias.  Si tiene una emergencia mdica, por favor llame inmediatamente al 911 o vaya a la sala de emergencias.  Nmeros de bper  - Dr. Kowalski: [phone]  - Dra. Moye: [phone]  - Dra. Stewart: [phone]  En caso de inclemencias del tiempo, por favor llame a nuestra lnea principal al [phone] para una actualizacin sobre el estado de cualquier retraso o cierre.  Consejos para la medicacin en dermatologa: Por favor, guarde las cajas en las que vienen los medicamentos de uso tpico para ayudarle a seguir las instrucciones sobre dnde y cmo usarlos. Las farmacias generalmente imprimen las instrucciones del medicamento slo en las cajas y no directamente en los tubos del medicamento.   Si su medicamento es muy caro, por favor, pngase en contacto con nuestra oficina llamando al [phone] y presione la opcin 4 o envenos un mensaje a travs de MyChart.   No podemos decirle cul ser su copago por los medicamentos por adelantado ya que esto es diferente dependiendo de la cobertura de su seguro. Sin embargo, es posible que podamos encontrar un medicamento sustituto a menor costo o llenar un formulario para que el  seguro cubra el medicamento que se considera necesario.   Si se requiere una autorizacin previa para que su compaa de seguros cubra su medicamento, por favor permtanos de 1 a 2 das hbiles para completar este proceso.  Los precios de los medicamentos varan con frecuencia dependiendo del lugar de dnde se surte la receta y alguna farmacias pueden ofrecer precios ms baratos.  El sitio web www.goodrx.com tiene cupones para medicamentos de diferentes farmacias. Los precios aqu no tienen en cuenta lo que podra costar con la ayuda del seguro (puede ser ms barato con su seguro), pero el sitio web puede darle el precio si no utiliz ningn seguro.  - Puede imprimir el cupn correspondiente y llevarlo con su receta a la farmacia.  - Tambin puede pasar por nuestra oficina durante el horario de atencin regular y recoger una tarjeta de cupones de GoodRx.  - Si necesita que su receta se enve electrnicamente a una farmacia diferente, informe a nuestra oficina a travs de MyChart de [place]   o por telfono llamando al [phone] y presione la opcin 4.  

## 2022-06-22 ENCOUNTER — Encounter: Payer: Self-pay | Admitting: Dermatology

## 2022-06-22 ENCOUNTER — Ambulatory Visit: Payer: 59 | Admitting: Surgery

## 2022-06-23 ENCOUNTER — Ambulatory Visit: Payer: 59 | Admitting: Nurse Practitioner

## 2022-06-25 ENCOUNTER — Other Ambulatory Visit: Payer: Self-pay | Admitting: Dermatology

## 2022-06-25 ENCOUNTER — Encounter: Payer: Self-pay | Admitting: Dermatology

## 2022-06-25 DIAGNOSIS — R21 Rash and other nonspecific skin eruption: Secondary | ICD-10-CM

## 2022-06-25 MED ORDER — DOXYCYCLINE MONOHYDRATE 100 MG PO CAPS: 100.0000 mg | ORAL_CAPSULE | Freq: Two times a day (BID) | ORAL | 0 refills | Status: DC

## 2022-06-25 MED ORDER — MUPIROCIN 2 % EX OINT: 1.0000 | TOPICAL_OINTMENT | Freq: Three times a day (TID) | CUTANEOUS | 0 refills | Status: DC

## 2022-06-25 NOTE — Telephone Encounter (Signed)
Patient left nurse voicemail that she is unable to get this area to heal and would like to see you.

## 2022-06-25 NOTE — Progress Notes (Signed)
Spoke with pt. She notes both significant itch and also pain. She also notes ulcer is larger than the original biopsy site. She is in [place] and unable to come to clinic today for wound PCR or culture and in person evaluation.  She also never received the mupirocin from [facility] when we sent in it in last month. She has been using OTC antibiotic ointment.  Will start doxycycline 100 mg twice a day x 7 days. Reviewed -Doxycycline should be taken with food to prevent nausea. Do not lay down for 30 minutes after taking. Be cautious with sun exposure and use good sun protection while on this medication. Pregnant women should not take this medication.   She will d/c OTC antibiotic ointment and start mupirocin ointment 3 times a day.  She will wash with chlorhexidine wash once a day.  Advised if she has any concern about an emergency or if this worsens/spreads over the weekend to seek medical care at urgent care or hospital.  Will recheck in clinic on Tuesday.

## 2022-06-30 ENCOUNTER — Encounter: Payer: Self-pay | Admitting: Dermatology

## 2022-06-30 ENCOUNTER — Ambulatory Visit: Payer: 59 | Admitting: Dermatology

## 2022-06-30 DIAGNOSIS — L233 Allergic contact dermatitis due to drugs in contact with skin: Secondary | ICD-10-CM | POA: Diagnosis not present

## 2022-06-30 DIAGNOSIS — Z5189 Encounter for other specified aftercare: Secondary | ICD-10-CM

## 2022-06-30 DIAGNOSIS — T490X5A Adverse effect of local antifungal, anti-infective and anti-inflammatory drugs, initial encounter: Secondary | ICD-10-CM

## 2022-06-30 DIAGNOSIS — L249 Irritant contact dermatitis, unspecified cause: Secondary | ICD-10-CM

## 2022-06-30 MED ORDER — TRIAMCINOLONE ACETONIDE 0.1 % EX OINT: TOPICAL_OINTMENT | CUTANEOUS | 2 refills | Status: DC

## 2022-06-30 NOTE — Progress Notes (Signed)
   Follow-Up Visit   Subjective  Mariah Bird is a 36 y.o. female who presents for the following: Wound Check (Check biopsy site. Did not receive Mupirocin. Has been using Neosporin. Thinks could be infected. Is not healing as expected).  The following portions of the chart were reviewed this encounter and updated as appropriate:  Tobacco  Allergies  Meds  Problems  Med Hx  Surg Hx  Fam Hx      Review of Systems: No other skin or systemic complaints except as noted in HPI or Assessment and Plan.   Objective  Well appearing patient in no apparent distress; mood and affect are within normal limits.  A focused examination was performed including right lateral buttock. Relevant physical exam findings are noted in the Assessment and Plan.  Right Hip (side) - Posterior Scaly erythematous patches  Right Hip (side) - Posterior Open wound, clean   Assessment & Plan  Allergic contact dermatitis due to drugs in contact with skin Right Hip (side) - Posterior  And irritant contact dermatitis  Avoid using Neosporin. Minimize adhesive.  Start Triamcinolone ointment twice daily to affected areas as needed for rash/itching. Avoid applying to face, groin, and axilla.  Topical steroids (such as triamcinolone, fluocinolone, fluocinonide, mometasone, clobetasol, halobetasol, betamethasone, hydrocortisone) can cause thinning and lightening of the skin if they are used for too long in the same area. Your physician has selected the right strength medicine for your problem and area affected on the body. Please use your medication only as directed by your physician to prevent side effects.    triamcinolone ointment (KENALOG) 0.1 % - Right Hip (side) - Posterior Apply twice daily to affected areas as needed for rash/itching. Avoid applying to face, groin, and axilla.  Encounter for wound re-check Right Hip (side) - Posterior  S/p biopsy site dehiscence.  Continue mupirocin ointment and  bandage daily. Cleanse with chlorhexidine daily.  OR Start using J&J Blister band-aids changing every 3 days. Cleanse with chlorhexidine at dressing changes.  Avoid neosporin due to allergic contact dermatitis.   Return if symptoms worsen or fail to improve.  I, Emelia Salisbury, MA, scribed for Alfonso Patten, MD.  Documentation: I have reviewed the above documentation for accuracy and completeness, and I agree with the above.  Forest Gleason, MD

## 2022-06-30 NOTE — Patient Instructions (Addendum)
Avoid using Neosporin.   Start using J&J Blister band-aids.  Start Triamcinolone ointment twice daily to affected areas as needed for rash/itching. Avoid applying to face, groin, and axilla.  Continue Mupirocin and bandaid until healed.    Due to recent changes in healthcare laws, you may see results of your pathology and/or laboratory studies on MyChart before the doctors have had a chance to review them. We understand that in some cases there may be results that are confusing or concerning to you. Please understand that not all results are received at the same time and often the doctors may need to interpret multiple results in order to provide you with the best plan of care or course of treatment. Therefore, we ask that you please give [place] 2 business days to thoroughly review all your results before contacting the office for clarification. Should we see a critical lab result, you will be contacted sooner.   If You Need Anything After Your Visit  If you have any questions or concerns for your doctor, please call our main line at [phone] and press option 4 to reach your doctor's medical assistant. If no one answers, please leave a voicemail as directed and we will return your call as soon as possible. Messages left after 4 pm will be answered the following business day.   You may also send [place] a message via [place]. We typically respond to MyChart messages within 1-2 business days.  For prescription refills, please ask your pharmacy to contact our office. Our fax number is [phone].  If you have an urgent issue when the clinic is closed that cannot wait until the next business day, you can page your doctor at the number below.    Please note that while we do our best to be available for urgent issues outside of office hours, we are not available 24/7.   If you have an urgent issue and are unable to reach [place], you may choose to seek medical care at your doctor's office, retail clinic,  urgent care center, or emergency room.  If you have a medical emergency, please immediately call 911 or go to the emergency department.  Pager Numbers  - Dr. Nehemiah Massed: [phone]  - Dr. Laurence Ferrari: [phone]  - Dr. Nicole Kindred: [phone]  In the event of inclement weather, please call our main line at [phone] for an update on the status of any delays or closures.  Dermatology Medication Tips: Please keep the boxes that topical medications come in in order to help keep track of the instructions about where and how to use these. Pharmacies typically print the medication instructions only on the boxes and not directly on the medication tubes.   If your medication is too expensive, please contact our office at [phone] option 4 or send [place] a message through [place].   We are unable to tell what your co-pay for medications will be in advance as this is different depending on your insurance coverage. However, we may be able to find a substitute medication at lower cost or fill out paperwork to get insurance to cover a needed medication.   If a prior authorization is required to get your medication covered by your insurance company, please allow [place] 1-2 business days to complete this process.  Drug prices often vary depending on where the prescription is filled and some pharmacies may offer cheaper prices.  The website www.goodrx.com contains coupons for medications through different pharmacies. The prices here do not account for what the cost  may be with help from insurance (it may be cheaper with your insurance), but the website can give you the price if you did not use any insurance.  - You can print the associated coupon and take it with your prescription to the pharmacy.  - You may also stop by our office during regular business hours and pick up a GoodRx coupon card.  - If you need your prescription sent electronically to a different pharmacy, notify our office through [facility] or by phone at [phone] option 4.     Si Usted Necesita Algo Despus de Su Visita  Tambin puede enviarnos un mensaje a travs de Pharmacist, community. Por lo general respondemos a los mensajes de MyChart en el transcurso de 1 a 2 das hbiles.  Para renovar recetas, por favor pida a su farmacia que se ponga en contacto con nuestra oficina. Harland Dingwall de fax es [place] [phone].  Si tiene un asunto urgente cuando la clnica est cerrada y que no puede esperar hasta el siguiente da hbil, puede llamar/localizar a su doctor(a) al nmero que aparece a continuacin.   Por favor, tenga en cuenta que aunque hacemos todo lo posible para estar disponibles para asuntos urgentes fuera del horario de [place], no estamos disponibles las 24 horas del da, los 7 das de la [place].   Si tiene un problema urgente y no puede comunicarse con nosotros, puede optar por buscar atencin mdica  en el consultorio de su doctor(a), en una clnica privada, en un centro de atencin urgente o en una sala de emergencias.  Si tiene Engineering geologist, por favor llame inmediatamente al 911 o vaya a la sala de emergencias.  Nmeros de bper  - Dr. Nehemiah Massed: [phone]  - Dra. Moye: [phone]  - Dra. Nicole Kindred: [phone]  En caso de inclemencias del [place], por favor llame a Johnsie Kindred principal al [phone] para una actualizacin sobre el [place] de cualquier retraso o cierre.  Consejos para la medicacin en dermatologa: Por favor, guarde las cajas en las que vienen los medicamentos de uso tpico para ayudarle a seguir las instrucciones sobre dnde y cmo usarlos. Las farmacias generalmente imprimen las instrucciones del medicamento slo en las cajas y no directamente en los tubos del [place].   Si su medicamento es muy caro, por favor, pngase en contacto con Zigmund Daniel llamando al [phone] y presione la opcin 4 o envenos un mensaje a travs de Pharmacist, community.   No podemos decirle cul  ser su copago por los medicamentos por adelantado ya que esto es diferente dependiendo de la cobertura de su seguro. Sin embargo, es posible que podamos encontrar un medicamento sustituto a Electrical engineer un formulario para que el seguro cubra el medicamento que se considera necesario.   Si se requiere una autorizacin previa para que su compaa de seguros [place] su medicamento, por favor permtanos de 1 a 2 das hbiles para completar este proceso.  Los precios de los medicamentos varan con frecuencia dependiendo del Environmental consultant de dnde se surte la receta y alguna farmacias pueden ofrecer precios ms baratos.  El sitio web www.goodrx.com tiene cupones para medicamentos de Airline pilot. Los precios aqu no tienen en cuenta lo que podra costar con la ayuda del seguro (puede ser ms barato con su seguro), pero el sitio web puede darle el precio si no utiliz Research scientist (physical sciences).  - Puede imprimir el cupn correspondiente y llevarlo con su receta a la farmacia.  - Tambin puede pasar por Cleotis Nipper  oficina durante el horario de atencin regular y Charity fundraiser una tarjeta de cupones de GoodRx.  - Si necesita que su receta se enve electrnicamente a una farmacia diferente, informe a nuestra oficina a travs de MyChart de [place] o por telfono llamando al [phone] y presione la opcin 4.

## 2022-07-14 ENCOUNTER — Telehealth: Payer: Self-pay | Admitting: Nurse Practitioner

## 2022-07-14 NOTE — Telephone Encounter (Signed)
Letter for seat cushion emailed to patient-Mariah Bird

## 2022-07-21 ENCOUNTER — Encounter: Payer: Self-pay | Admitting: Nurse Practitioner

## 2022-07-28 ENCOUNTER — Encounter: Payer: Self-pay | Admitting: Nurse Practitioner

## 2022-07-28 ENCOUNTER — Ambulatory Visit: Payer: 59 | Admitting: Nurse Practitioner

## 2022-07-28 VITALS — BP 116/78 | HR 80 | Temp 98.3°F | Resp 16 | Ht 61.0 in | Wt 86.6 lb

## 2022-07-28 DIAGNOSIS — R634 Abnormal weight loss: Secondary | ICD-10-CM | POA: Diagnosis not present

## 2022-07-28 DIAGNOSIS — F411 Generalized anxiety disorder: Secondary | ICD-10-CM

## 2022-07-28 DIAGNOSIS — J029 Acute pharyngitis, unspecified: Secondary | ICD-10-CM | POA: Diagnosis not present

## 2022-07-28 DIAGNOSIS — G4700 Insomnia, unspecified: Secondary | ICD-10-CM | POA: Diagnosis not present

## 2022-07-28 MED ORDER — MIRTAZAPINE 15 MG PO TABS: 15.0000 mg | ORAL_TABLET | Freq: Every day | ORAL | 2 refills | Status: DC

## 2022-07-28 MED ORDER — AZITHROMYCIN 250 MG PO TABS: ORAL_TABLET | ORAL | 0 refills | Status: AC

## 2022-07-28 NOTE — Progress Notes (Signed)
[facility] [place], [place] [address]  Internal MEDICINE  Office Visit Note  Patient Name: Mariah Bird  [id number]  [id number]  Date of Service: 07/28/2022  Chief Complaint  Patient presents with   Follow-up   Depression    HPI Mariah Bird presents for a follow up visit for depression, sinus symptoms, and forms for work accomodation. Depression -- wants medication dose increased.  Abnormal weight loss -- still cannot gain any weight with megestrol or dronabinol.  Sleep - takes trazodone to help her sleep.  Sinus symptoms -- sore throat, nasal congestion, runny nose,    Current Medication: Outpatient Encounter Medications as of 07/28/2022  Medication Sig   azithromycin (ZITHROMAX) 250 MG tablet Take 2 tablets on day 1, then 1 tablet daily on days 2 through 5   citalopram (CELEXA) 20 MG tablet Take 1 tablet (20 mg total) by mouth daily.   diphenoxylate-atropine (LOMOTIL) 2.5-0.025 MG tablet Take 1 tablet by mouth 4 (four) times daily as needed for diarrhea or loose stools.   doxycycline (MONODOX) 100 MG capsule Take 1 capsule (100 mg total) by mouth 2 (two) times daily. Doxycycline should be taken with food to prevent nausea. Do not lay down for 30 minutes after taking. Be cautious with sun exposure and use good sun protection while on this medication. Pregnant women should not take this medication.   dronabinol (MARINOL) 5 MG capsule Take 1 capsule (5 mg total) by mouth 2 (two) times daily before lunch and supper. Take 1 hour before each meal (except breakfast)   hydrocortisone (ANUSOL-HC) 2.5 % rectal cream Place 1 Application rectally 2 (two) times daily.   lidocaine (XYLOCAINE) 5 % ointment Apply 1 Application topically as needed.   loperamide (IMODIUM) 2 MG capsule Take 1 capsule (2 mg total) by mouth 4 (four) times daily as needed for diarrhea or loose stools.   megestrol (MEGACE) 40 MG tablet Take 2 tablets (80 mg total) by mouth 2 (two) times  daily.   mirtazapine (REMERON) 15 MG tablet Take 1 tablet (15 mg total) by mouth at bedtime.   mupirocin ointment (BACTROBAN) 2 % Apply 1 Application topically daily.   ondansetron (ZOFRAN-ODT) 4 MG disintegrating tablet Take 1 tablet (4 mg total) by mouth every 8 (eight) hours as needed for nausea or vomiting.   sucralfate (CARAFATE) 1 g tablet Take 1 g by mouth 3 (three) times daily.   triamcinolone ointment (KENALOG) 0.1 % Apply twice daily to affected areas as needed for rash/itching. Avoid applying to face, groin, and axilla.   Vitamin D, Ergocalciferol, (DRISDOL) 1.25 MG (50000 UNIT) CAPS capsule Take 1 capsule (50,000 Units total) by mouth every 7 (seven) days.   [DISCONTINUED] mupirocin ointment (BACTROBAN) 2 % Apply 1 Application topically 3 (three) times daily.   [DISCONTINUED] traZODone (DESYREL) 50 MG tablet Take 0.5-1 tablets (25-50 mg total) by mouth at bedtime as needed for sleep.   pantoprazole (PROTONIX) 40 MG tablet Take 1 tablet (40 mg total) by mouth daily.   No facility-administered encounter medications on file as of 07/28/2022.    Surgical History: Past Surgical History:  Procedure Laterality Date   KNEE ARTHROSCOPY Right    NASAL SINUS SURGERY  12/18/2016    Medical History: Past Medical History:  Diagnosis Date   Anxiety    Depression    Hyperthyroidism    Nephrolithiasis     Family History: Family History  Problem Relation Age of Onset   Lupus Sister    Kidney disease  Maternal Uncle    Kidney Stones Paternal Grandmother    Bladder Cancer Neg Hx    Kidney cancer Neg Hx    Prostate cancer Neg Hx     Social History   Socioeconomic History   Marital status: Single    Spouse name: Not on file   Number of children: Not on file   Years of education: Not on file   Highest education level: Not on file  Occupational History   Not on file  Tobacco Use   Smoking status: Never   Smokeless tobacco: Never  Vaping Use   Vaping Use: Never used   Substance and Sexual Activity   Alcohol use: Not Currently    Comment: occ   Drug use: Yes    Types: Marijuana    Comment: occ   Sexual activity: Not on file  Other Topics Concern   Not on file  Social History Narrative   Not on file   Social Determinants of Health   Financial Resource Strain: Not on file  Food Insecurity: Not on file  Transportation Needs: Not on file  Physical Activity: Not on file  Stress: Not on file  Social Connections: Not on file  Intimate Partner Violence: Not on file      Review of Systems  Constitutional:  Positive for appetite change and fatigue.  HENT:  Positive for congestion, postnasal drip, rhinorrhea, sinus pressure and sore throat.   Respiratory:  Positive for cough. Negative for chest tightness, shortness of breath and wheezing.   Cardiovascular: Negative.  Negative for chest pain and palpitations.  Neurological:  Positive for headaches.    Vital Signs: BP 116/78   Pulse 80   Temp 98.3 F (36.8 C)   Resp 16   Ht '5\' 1"'$  (1.549 m)   Wt 86 lb 9.6 oz (39.3 kg)   SpO2 96%   BMI 16.36 kg/m    Physical Exam Constitutional:      General: She is not in acute distress.    Appearance: Normal appearance. She is ill-appearing.  HENT:     Head: Normocephalic and atraumatic.     Right Ear: Tympanic membrane, ear canal and external ear normal.     Left Ear: Tympanic membrane, ear canal and external ear normal.     Nose: Mucosal edema, congestion and rhinorrhea present. Rhinorrhea is purulent.     Right Turbinates: Swollen and pale.     Left Turbinates: Swollen and pale.     Mouth/Throat:     Pharynx: Oropharyngeal exudate and posterior oropharyngeal erythema present.     Tonsils: 2+ on the right. 2+ on the left.      Comments: Bilateral tonsil stones at the apex of each tonsil.  Eyes:     Pupils: Pupils are equal, round, and reactive to light.  Cardiovascular:     Rate and Rhythm: Normal rate and regular rhythm.  Pulmonary:      Effort: Pulmonary effort is normal. No respiratory distress.  Neurological:     Mental Status: She is alert and oriented to person, place, and time.  Psychiatric:        Mood and Affect: Mood normal.        Behavior: Behavior normal.       Assessment/Plan: 1. Abnormal weight loss Trazodone discontinued and mirtazapine added. Mirtazapine can cause increase appetite and weight gain.  - mirtazapine (REMERON) 15 MG tablet; Take 1 tablet (15 mg total) by mouth at bedtime.  Dispense: 30 tablet; Refill: 2  2. Pharyngitis, unspecified etiology Zpack prescribed.  - azithromycin (ZITHROMAX) 250 MG tablet; Take 2 tablets on day 1, then 1 tablet daily on days 2 through 5  Dispense: 6 tablet; Refill: 0  3. Insomnia, unspecified type Mirtazapine prescribed, trazodone discontinued.  - mirtazapine (REMERON) 15 MG tablet; Take 1 tablet (15 mg total) by mouth at bedtime.  Dispense: 30 tablet; Refill: 2  4. GAD (generalized anxiety disorder) Try mirtazapine, trazodone discontinued.  - mirtazapine (REMERON) 15 MG tablet; Take 1 tablet (15 mg total) by mouth at bedtime.  Dispense: 30 tablet; Refill: 2   General Counseling: Mariah Bird verbalizes understanding of the findings of todays visit and agrees with plan of treatment. I have discussed any further diagnostic evaluation that may be needed or ordered today. We also reviewed her medications today. she has been encouraged to call the office with any questions or concerns that should arise related to todays visit.    No orders of the defined types were placed in this encounter.   Meds ordered this encounter  Medications   mirtazapine (REMERON) 15 MG tablet    Sig: Take 1 tablet (15 mg total) by mouth at bedtime.    Dispense:  30 tablet    Refill:  2    Discontinue trazodone, fill new script today   azithromycin (ZITHROMAX) 250 MG tablet    Sig: Take 2 tablets on day 1, then 1 tablet daily on days 2 through 5    Dispense:  6 tablet    Refill:   0    Return in about 1 month (around 08/28/2022) for F/U, eval new med, Martie Fulgham PCP.   Total time spent:30 Minutes Time spent includes review of chart, medications, test results, and follow up plan with the patient.   [place] Controlled Substance Database was reviewed by me.  This patient was seen by Jonetta Osgood, FNP-C in collaboration with Dr. Clayborn Bigness as a part of collaborative care agreement.   Loni Abdon R. Valetta Fuller, MSN, FNP-C Internal medicine

## 2022-08-31 ENCOUNTER — Ambulatory Visit: Payer: 59 | Admitting: Nurse Practitioner

## 2022-08-31 ENCOUNTER — Encounter: Payer: Self-pay | Admitting: Nurse Practitioner

## 2022-08-31 VITALS — BP 110/70 | HR 68 | Temp 98.0°F | Resp 16 | Ht 61.0 in | Wt 89.0 lb

## 2022-08-31 DIAGNOSIS — G4700 Insomnia, unspecified: Secondary | ICD-10-CM

## 2022-08-31 DIAGNOSIS — E559 Vitamin D deficiency, unspecified: Secondary | ICD-10-CM

## 2022-08-31 DIAGNOSIS — R634 Abnormal weight loss: Secondary | ICD-10-CM

## 2022-08-31 MED ORDER — VITAMIN D (ERGOCALCIFEROL) 1.25 MG (50000 UNIT) PO CAPS: 50000.0000 [IU] | ORAL_CAPSULE | ORAL | 1 refills | Status: DC

## 2022-08-31 NOTE — Progress Notes (Signed)
[facility] [place], [place] [address]  Internal MEDICINE  Office Visit Note  Patient Name: Mariah Bird  [id number]  [id number]  Date of Service: 08/31/2022  Chief Complaint  Patient presents with   Follow-up   Anxiety   Depression    HPI Narcissa presents for a follow-up visit for new medication, right knee pain and concern for autoimmune disease.  Mirtazapine is helping to improve sleep. Patient possibly gained 1-3 lbs.  Right knee bothering patient, tender just above superior edge of patella. Need working on ruling out possible causes of weight loss and GI symptoms. ANA was positive in June 2023.  Refill due for weekly vitamin D    Current Medication: Outpatient Encounter Medications as of 08/31/2022  Medication Sig   citalopram (CELEXA) 20 MG tablet Take 1 tablet (20 mg total) by mouth daily.   diphenoxylate-atropine (LOMOTIL) 2.5-0.025 MG tablet Take 1 tablet by mouth 4 (four) times daily as needed for diarrhea or loose stools.   doxycycline (MONODOX) 100 MG capsule Take 1 capsule (100 mg total) by mouth 2 (two) times daily. Doxycycline should be taken with food to prevent nausea. Do not lay down for 30 minutes after taking. Be cautious with sun exposure and use good sun protection while on this medication. Pregnant women should not take this medication.   dronabinol (MARINOL) 5 MG capsule Take 1 capsule (5 mg total) by mouth 2 (two) times daily before lunch and supper. Take 1 hour before each meal (except breakfast)   hydrocortisone (ANUSOL-HC) 2.5 % rectal cream Place 1 Application rectally 2 (two) times daily.   lidocaine (XYLOCAINE) 5 % ointment Apply 1 Application topically as needed.   loperamide (IMODIUM) 2 MG capsule Take 1 capsule (2 mg total) by mouth 4 (four) times daily as needed for diarrhea or loose stools.   megestrol (MEGACE) 40 MG tablet Take 2 tablets (80 mg total) by mouth 2 (two) times daily.   mirtazapine (REMERON) 15 MG  tablet Take 1 tablet (15 mg total) by mouth at bedtime.   mupirocin ointment (BACTROBAN) 2 % Apply 1 Application topically daily.   ondansetron (ZOFRAN-ODT) 4 MG disintegrating tablet Take 1 tablet (4 mg total) by mouth every 8 (eight) hours as needed for nausea or vomiting.   sucralfate (CARAFATE) 1 g tablet Take 1 g by mouth 3 (three) times daily.   triamcinolone ointment (KENALOG) 0.1 % Apply twice daily to affected areas as needed for rash/itching. Avoid applying to face, groin, and axilla.   [DISCONTINUED] Vitamin D, Ergocalciferol, (DRISDOL) 1.25 MG (50000 UNIT) CAPS capsule Take 1 capsule (50,000 Units total) by mouth every 7 (seven) days.   pantoprazole (PROTONIX) 40 MG tablet Take 1 tablet (40 mg total) by mouth daily.   Vitamin D, Ergocalciferol, (DRISDOL) 1.25 MG (50000 UNIT) CAPS capsule Take 1 capsule (50,000 Units total) by mouth every 7 (seven) days.   No facility-administered encounter medications on file as of 08/31/2022.    Surgical History: Past Surgical History:  Procedure Laterality Date   KNEE ARTHROSCOPY Right    NASAL SINUS SURGERY  12/18/2016    Medical History: Past Medical History:  Diagnosis Date   Anxiety    Depression    Hyperthyroidism    Nephrolithiasis     Family History: Family History  Problem Relation Age of Onset   Lupus Sister    Kidney disease Maternal Uncle    Kidney Stones Paternal Grandmother    Bladder Cancer Neg Hx    Kidney  cancer Neg Hx    Prostate cancer Neg Hx     Social History   Socioeconomic History   Marital status: Single    Spouse name: Not on file   Number of children: Not on file   Years of education: Not on file   Highest education level: Not on file  Occupational History   Not on file  Tobacco Use   Smoking status: Never   Smokeless tobacco: Never  Vaping Use   Vaping Use: Never used  Substance and Sexual Activity   Alcohol use: Not Currently    Comment: occ   Drug use: Yes    Types: Marijuana     Comment: occ   Sexual activity: Not on file  Other Topics Concern   Not on file  Social History Narrative   Not on file   Social Determinants of Health   Financial Resource Strain: Not on file  Food Insecurity: Not on file  Transportation Needs: Not on file  Physical Activity: Not on file  Stress: Not on file  Social Connections: Not on file  Intimate Partner Violence: Not on file      Review of Systems  Constitutional:  Positive for appetite change (continued decreased/lack of appetite. has not started dronabinol yet.), fatigue and unexpected weight change (lost 2 more lbs, down to 88 lbs, BMI 16.63). Negative for chills and fever.  HENT: Negative.  Negative for dental problem.   Eyes: Negative.   Respiratory: Negative.  Negative for cough, chest tightness, shortness of breath and wheezing.   Cardiovascular: Negative.  Negative for chest pain and palpitations.  Gastrointestinal:  Positive for abdominal distention, nausea and rectal pain (has an irritated small hemorrhoid).       Increased frequency and variable consistency of stool  Endocrine:       Temp fluctuations, hair loss, weight loss, need to order labs to rule out hormone abnormalities  Genitourinary:  Positive for frequency, menstrual problem, pelvic pain, vaginal bleeding, vaginal discharge and vaginal pain. Negative for difficulty urinating, dysuria, enuresis, flank pain and hematuria.       New onset urinary incontinence and pelvic/vaginal pressure  Musculoskeletal:  Positive for myalgias.  Skin: Negative.   Allergic/Immunologic: Negative for immunocompromised state.  Neurological:  Positive for weakness (muscle weakness, body ache).  Hematological:  Bruises/bleeds easily.  Psychiatric/Behavioral:  The patient is nervous/anxious (expected due to current medical problem).     Vital Signs: BP 110/70   Pulse 68   Temp 98 F (36.7 C)   Resp 16   Ht '5\' 1"'$  (1.549 m)   Wt 89 lb (40.4 kg)   SpO2 98%   BMI 16.82  kg/m    Physical Exam Vitals reviewed.  Constitutional:      General: She is not in acute distress.    Appearance: Normal appearance. She is underweight. She is not ill-appearing.  HENT:     Head: Normocephalic and atraumatic.  Eyes:     Pupils: Pupils are equal, round, and reactive to light.  Cardiovascular:     Rate and Rhythm: Normal rate and regular rhythm.  Pulmonary:     Effort: Pulmonary effort is normal. No respiratory distress.  Neurological:     Mental Status: She is alert and oriented to person, place, and time.  Psychiatric:        Mood and Affect: Mood normal.        Behavior: Behavior normal.        Assessment/Plan: 1. Abnormal weight loss  Additional labs ordered to rule out thyroid issues, patient also has a family history of thyroid cancer. Rule out addison's or adrenal gland issues - Thyroglobulin antibody - Thyroid peroxidase antibody - Cortisol-am, blood - TSH + free T4  2. Insomnia, unspecified type Sleep is improving.  - Thyroglobulin antibody - Thyroid peroxidase antibody - Cortisol-am, blood - TSH + free T4  3. Vitamin D deficiency Continue weekly vitamin D as prescribed. - Vitamin D, Ergocalciferol, (DRISDOL) 1.25 MG (50000 UNIT) CAPS capsule; Take 1 capsule (50,000 Units total) by mouth every 7 (seven) days.  Dispense: 15 capsule; Refill: 1   General Counseling: Kaprice verbalizes understanding of the findings of todays visit and agrees with plan of treatment. I have discussed any further diagnostic evaluation that may be needed or ordered today. We also reviewed her medications today. she has been encouraged to call the office with any questions or concerns that should arise related to todays visit.    Orders Placed This Encounter  Procedures   Thyroglobulin antibody   Thyroid peroxidase antibody   Cortisol-am, blood   TSH + free T4    Meds ordered this encounter  Medications   Vitamin D, Ergocalciferol, (DRISDOL) 1.25 MG (50000  UNIT) CAPS capsule    Sig: Take 1 capsule (50,000 Units total) by mouth every 7 (seven) days.    Dispense:  15 capsule    Refill:  1    Return in about 4 weeks (around 09/28/2022) for F/U, Labs, Maryjo Ragon PCP and weight gain.   Total time spent:30 Minutes Time spent includes review of chart, medications, test results, and follow up plan with the patient.   [place] Controlled Substance Database was reviewed by me.  This patient was seen by Jonetta Osgood, FNP-C in collaboration with Dr. Clayborn Bigness as a part of collaborative care agreement.   Halynn Reitano R. Valetta Fuller, MSN, FNP-C Internal medicine

## 2022-09-14 ENCOUNTER — Encounter: Payer: Self-pay | Admitting: Nurse Practitioner

## 2022-09-28 ENCOUNTER — Ambulatory Visit: Payer: 59 | Admitting: Nurse Practitioner

## 2022-10-11 ENCOUNTER — Encounter: Payer: Self-pay | Admitting: Nurse Practitioner

## 2022-10-11 ENCOUNTER — Emergency Department
Admission: EM | Admit: 2022-10-11 | Discharge: 2022-10-11 | Disposition: A | Discharge status: Home / Self Care | Payer: 59 | Attending: Emergency Medicine | Admitting: Emergency Medicine

## 2022-10-11 ENCOUNTER — Emergency Department: Payer: 59

## 2022-10-11 DIAGNOSIS — R1031 Right lower quadrant pain: Secondary | ICD-10-CM | POA: Insufficient documentation

## 2022-10-11 LAB — CBC WITH DIFFERENTIAL/PLATELET
Abs Immature Granulocytes: 0.02 10*3/uL (ref 0.00–0.07)
Basophils Absolute: 0 10*3/uL (ref 0.0–0.1)
Basophils Relative: 1 %
Eosinophils Absolute: 0 10*3/uL (ref 0.0–0.5)
Eosinophils Relative: 1 %
HCT: 37.3 % (ref 36.0–46.0)
Hemoglobin: 12.6 g/dL (ref 12.0–15.0)
Immature Granulocytes: 0 %
Lymphocytes Relative: 29 %
Lymphs Abs: 1.5 10*3/uL (ref 0.7–4.0)
MCH: 31.9 pg (ref 26.0–34.0)
MCHC: 33.8 g/dL (ref 30.0–36.0)
MCV: 94.4 fL (ref 80.0–100.0)
Monocytes Absolute: 0.4 10*3/uL (ref 0.1–1.0)
Monocytes Relative: 7 %
Neutro Abs: 3.3 10*3/uL (ref 1.7–7.7)
Neutrophils Relative %: 62 %
Platelets: 240 10*3/uL (ref 150–400)
RBC: 3.95 MIL/uL (ref 3.87–5.11)
RDW: 11.9 % (ref 11.5–15.5)
WBC: 5.3 10*3/uL (ref 4.0–10.5)
nRBC: 0 % (ref 0.0–0.2)

## 2022-10-11 LAB — URINALYSIS, ROUTINE W REFLEX MICROSCOPIC
Bacteria, UA: NONE SEEN
Bilirubin Urine: NEGATIVE
Glucose, UA: NEGATIVE mg/dL
Ketones, ur: 80 mg/dL — AB
Leukocytes,Ua: NEGATIVE
Nitrite: NEGATIVE
Protein, ur: 30 mg/dL — AB
RBC / HPF: >50 RBC/hpf (ref 0–5)
Specific Gravity, Urine: 1.015 (ref 1.005–1.030)
pH: 7 (ref 5.0–8.0)

## 2022-10-11 LAB — COMPREHENSIVE METABOLIC PANEL
ALT: 24 U/L (ref 0–44)
AST: 24 U/L (ref 15–41)
Albumin: 4.6 g/dL (ref 3.5–5.0)
Alkaline Phosphatase: 67 U/L (ref 38–126)
Anion gap: 11 (ref 5–15)
BUN: 12 mg/dL (ref 6–20)
CO2: 20 mmol/L — ABNORMAL LOW (ref 22–32)
Calcium: 9.4 mg/dL (ref 8.9–10.3)
Chloride: 106 mmol/L (ref 98–111)
Creatinine, Ser: 0.63 mg/dL (ref 0.44–1.00)
GFR, Estimated: >60 mL/min (ref >60)
Glucose, Bld: 117 mg/dL — ABNORMAL HIGH (ref 70–99)
Potassium: 3.5 mmol/L (ref 3.5–5.1)
Sodium: 137 mmol/L (ref 135–145)
Total Bilirubin: 0.8 mg/dL (ref 0.3–1.2)
Total Protein: 7.7 g/dL (ref 6.5–8.1)

## 2022-10-11 LAB — LIPASE, BLOOD: Lipase: 29 U/L (ref 11–51)

## 2022-10-11 LAB — HCG, QUANTITATIVE, PREGNANCY: hCG, Beta Chain, Quant, S: <1 m[IU]/mL (ref <5)

## 2022-10-11 MED ORDER — ONDANSETRON HCL 4 MG/2ML IJ SOLN
4.0000 mg | Freq: Once | INTRAMUSCULAR | Status: AC
  Administered 2022-10-11: 4 mg via INTRAVENOUS
  Filled 2022-10-11: qty 2

## 2022-10-11 MED ORDER — FENTANYL CITRATE PF 50 MCG/ML IJ SOSY
50.0000 ug | PREFILLED_SYRINGE | Freq: Once | INTRAMUSCULAR | Status: AC
  Administered 2022-10-11: 50 ug via INTRAVENOUS
  Filled 2022-10-11: qty 1

## 2022-10-11 MED ORDER — SODIUM CHLORIDE 0.9 % IV BOLUS
1000.0000 mL | Freq: Once | INTRAVENOUS | Status: AC
  Administered 2022-10-11: 1000 mL via INTRAVENOUS

## 2022-10-11 MED ORDER — HYDROCODONE-ACETAMINOPHEN 5-325 MG PO TABS: 1.0000 | ORAL_TABLET | ORAL | 0 refills | Status: DC | PRN

## 2022-10-11 MED ORDER — KETOROLAC TROMETHAMINE 30 MG/ML IJ SOLN
15.0000 mg | Freq: Once | INTRAMUSCULAR | Status: AC
  Administered 2022-10-11: 15 mg via INTRAVENOUS
  Filled 2022-10-11: qty 1

## 2022-10-11 NOTE — ED Triage Notes (Addendum)
RIGHT sided abdominal pain that began last night; Patient said that she woke up from sleep with that pain and it has not gotten any better; Denies dysuria, last bowel movement was yesterday; Patient is lying on the floor and screaming during triage; When asked if she could be pregnant, patient yelled "MAM I AM NOT PREGNANT"

## 2022-10-11 NOTE — ED Provider Notes (Signed)
[facility] Provider Note    Event Date/Time   First MD Initiated Contact with Patient 10/11/22 1511     (approximate)  History   Chief Complaint: Abdominal Pain (RIGHT sided abdominal pain that began last night; Patient said that she woke up from sleep with that pain and it has not gotten any better; Denies dysuria, last bowel movement was yesterday; Patient is lying on the floor and screaming during triage; When asked if she could be pregnant, patient yelled "MAM I AM NOT PREGNANT" )  HPI  Mariah Bird is a 37 y.o. female with a past medical history anxiety, depression, kidney stones, presents emergency department for acute onset right flank pain.  According to the patient on 2:00 in the morning she had acute onset of right flank pain that kept her awake throughout the night.  Patient states she felt the pain was getting better throughout the day but then worsened again this afternoon so the patient came to the emergency department.  Has not noted any dysuria or fever had not noted any hematuria.  Does have a history of kidney stones which this feels somewhat similar.  No prior abdominal surgeries.  Does not believe she could be pregnant.  Physical Exam   Triage Vital Signs: ED Triage Vitals  Enc Vitals Group     BP 10/11/22 1342 126/89     Pulse Rate 10/11/22 1342 (!) 59     Resp 10/11/22 1342 (!) 22     Temp --      Temp src --      SpO2 10/11/22 1342 100 %     Weight 10/11/22 1339 90 lb (40.8 kg)     Height 10/11/22 1339 '5\' 1"'$  (1.549 m)     Head Circumference --      Peak Flow --      Pain Score 10/11/22 1340 10     Pain Loc --      Pain Edu? --      Excl. in [place]? --     Most recent vital signs: Vitals:   10/11/22 1342  BP: 126/89  Pulse: (!) 59  Resp: (!) 22  SpO2: 100%    General: Awake, no distress.  CV:  Good peripheral perfusion.  Regular rate and rhythm  Resp:  Normal effort.  Equal breath sounds bilaterally.  Abd:  No distention.   Soft, mild right lower quadrant tenderness without rebound or guarding.   ED Results / Procedures / Treatments   RADIOLOGY  I have reviewed and interpreted the CT images.  No obvious ureterolithiasis seen on my evaluation. Radiology is read the CT scan is a tiny right renal calculus with no ureteral calculi noted.  No other acute finding either.   MEDICATIONS ORDERED IN ED: Medications  sodium chloride 0.9 % bolus 1,000 mL (has no administration in time range)  ketorolac (TORADOL) 30 MG/ML injection 15 mg (has no administration in time range)  ondansetron (ZOFRAN) injection 4 mg (has no administration in time range)  fentaNYL (SUBLIMAZE) injection 50 mcg (has no administration in time range)     IMPRESSION / MDM / ASSESSMENT AND PLAN / ED COURSE  I reviewed the triage vital signs and the nursing notes.  Patient's presentation is most consistent with acute presentation with potential threat to life or bodily function.  Patient presents to the emergency department for right flank pain starting at 2:00 this morning has become intermittent and sharp per patient.  Wrapping around to  her back.  History of kidney stones.  No hematuria or dysuria noted.  Patient is just finishing her menstrual cycle but does not believe she could be pregnant.  States some nausea but no vomiting.  No diarrhea.  No urinary symptoms.  Patient's lab work is reassuring normal CBC with normal white blood cell count, reassuring chemistry with normal LFTs and lipase.  Beta-hCG is negative.  Urinalysis is pending.  Given the patient's right flank pain history of kidney stones we will obtain CT renal scan to further evaluate.  Will treat the patient's pain and IV hydrate and continue to closely monitor while awaiting CT and urine results.  Patient agreeable to plan of care.  Differential would include ureterolithiasis, UTI or pyelonephritis, constipation colitis or diverticulitis.  Patient's workup is reassuring.   Urinalysis does show some blood but otherwise normal patient states she is finishing her menstrual cycle.  Patient CT scan is negative for any ureterolithiasis or other acute abnormality.  Given the patient's reassuring workup I believe the patient will be safe for discharge home.  We will prescribe a short course of pain medication for the patient have her follow-up with her doctor.  Patient agreeable to plan of care.  FINAL CLINICAL IMPRESSION(S) / ED DIAGNOSES   Right flank pain  Note:  This document was prepared using Dragon voice recognition software and may include unintentional dictation errors.   Harvest Dark, MD 10/11/22 [phone]

## 2022-10-12 ENCOUNTER — Encounter: Payer: Self-pay | Admitting: Nurse Practitioner

## 2022-10-12 ENCOUNTER — Ambulatory Visit: Payer: 59 | Admitting: Nurse Practitioner

## 2022-10-12 VITALS — BP 100/73 | HR 66 | Temp 98.3°F | Resp 16 | Ht 61.0 in | Wt 89.0 lb

## 2022-10-12 DIAGNOSIS — R634 Abnormal weight loss: Secondary | ICD-10-CM

## 2022-10-12 DIAGNOSIS — R1084 Generalized abdominal pain: Secondary | ICD-10-CM

## 2022-10-12 DIAGNOSIS — R197 Diarrhea, unspecified: Secondary | ICD-10-CM

## 2022-10-12 DIAGNOSIS — R630 Anorexia: Secondary | ICD-10-CM

## 2022-10-12 NOTE — Progress Notes (Signed)
[facility] [place], [place] [address]  Internal MEDICINE  Office Visit Note  Patient Name: Mariah Bird  [id number]  [id number]  Date of Service: 10/12/2022  Chief Complaint  Patient presents with   Follow-up    F/u labs and weight gain    HPI Dezlynn presents for a follow-up visit for abnormal weight loss, and other GI symptoms.  --requesting second opinion from [facility] gastroenterology.  --still experiencing generalized abdominal pain, abnormal weight loss, decreased appetite, and diarrhea. Most symptoms have been present for several months or longer.  Has been feeling sick and has not been taking either of the 2 appetite stimulants she has been prescribed. Is considering restarting those.  Keeps lomotil on hand in her purse just incase she has an episode of diarrhea.     Current Medication: Outpatient Encounter Medications as of 10/12/2022  Medication Sig   citalopram (CELEXA) 20 MG tablet Take 1 tablet (20 mg total) by mouth daily.   diphenoxylate-atropine (LOMOTIL) 2.5-0.025 MG tablet Take 1 tablet by mouth 4 (four) times daily as needed for diarrhea or loose stools.   doxycycline (MONODOX) 100 MG capsule Take 1 capsule (100 mg total) by mouth 2 (two) times daily. Doxycycline should be taken with food to prevent nausea. Do not lay down for 30 minutes after taking. Be cautious with sun exposure and use good sun protection while on this medication. Pregnant women should not take this medication.   dronabinol (MARINOL) 5 MG capsule Take 1 capsule (5 mg total) by mouth 2 (two) times daily before lunch and supper. Take 1 hour before each meal (except breakfast)   HYDROcodone-acetaminophen (NORCO/VICODIN) 5-325 MG tablet Take 1 tablet by mouth every 4 (four) hours as needed.   hydrocortisone (ANUSOL-HC) 2.5 % rectal cream Place 1 Application rectally 2 (two) times daily.   lidocaine (XYLOCAINE) 5 % ointment Apply 1 Application topically as needed.    loperamide (IMODIUM) 2 MG capsule Take 1 capsule (2 mg total) by mouth 4 (four) times daily as needed for diarrhea or loose stools.   megestrol (MEGACE) 40 MG tablet Take 2 tablets (80 mg total) by mouth 2 (two) times daily.   mirtazapine (REMERON) 15 MG tablet Take 1 tablet (15 mg total) by mouth at bedtime.   mupirocin ointment (BACTROBAN) 2 % Apply 1 Application topically daily.   ondansetron (ZOFRAN-ODT) 4 MG disintegrating tablet Take 1 tablet (4 mg total) by mouth every 8 (eight) hours as needed for nausea or vomiting.   sucralfate (CARAFATE) 1 g tablet Take 1 g by mouth 3 (three) times daily.   triamcinolone ointment (KENALOG) 0.1 % Apply twice daily to affected areas as needed for rash/itching. Avoid applying to face, groin, and axilla.   Vitamin D, Ergocalciferol, (DRISDOL) 1.25 MG (50000 UNIT) CAPS capsule Take 1 capsule (50,000 Units total) by mouth every 7 (seven) days.   pantoprazole (PROTONIX) 40 MG tablet Take 1 tablet (40 mg total) by mouth daily.   No facility-administered encounter medications on file as of 10/12/2022.    Surgical History: Past Surgical History:  Procedure Laterality Date   KNEE ARTHROSCOPY Right    NASAL SINUS SURGERY  12/18/2016    Medical History: Past Medical History:  Diagnosis Date   Anxiety    Depression    Hyperthyroidism    Nephrolithiasis     Family History: Family History  Problem Relation Age of Onset   Lupus Sister    Kidney disease Maternal Uncle    Kidney Stones  Paternal Grandmother    Bladder Cancer Neg Hx    Kidney cancer Neg Hx    Prostate cancer Neg Hx     Social History   Socioeconomic History   Marital status: Single    Spouse name: Not on file   Number of children: Not on file   Years of education: Not on file   Highest education level: Not on file  Occupational History   Not on file  Tobacco Use   Smoking status: Never   Smokeless tobacco: Never  Vaping Use   Vaping Use: Never used  Substance and Sexual  Activity   Alcohol use: Not Currently    Comment: occ   Drug use: Yes    Types: Marijuana    Comment: occ   Sexual activity: Not on file  Other Topics Concern   Not on file  Social History Narrative   Not on file   Social Determinants of Health   Financial Resource Strain: Not on file  Food Insecurity: Not on file  Transportation Needs: Not on file  Physical Activity: Not on file  Stress: Not on file  Social Connections: Not on file  Intimate Partner Violence: Not on file      Review of Systems  Constitutional:  Positive for appetite change (continued decreased/lack of appetite. has not started dronabinol yet.), fatigue and unexpected weight change (lost 2 more lbs, down to 88 lbs, BMI 16.63). Negative for chills and fever.  HENT: Negative.  Negative for dental problem.   Eyes: Negative.   Respiratory: Negative.  Negative for cough, chest tightness, shortness of breath and wheezing.   Cardiovascular: Negative.  Negative for chest pain and palpitations.  Gastrointestinal:  Positive for abdominal distention, nausea and rectal pain (has an irritated small hemorrhoid).       Increased frequency and variable consistency of stool  Endocrine:       Temp fluctuations, hair loss, weight loss, need to order labs to rule out hormone abnormalities  Genitourinary:  Positive for frequency, menstrual problem, pelvic pain, vaginal bleeding, vaginal discharge and vaginal pain. Negative for difficulty urinating, dysuria, enuresis, flank pain and hematuria.       New onset urinary incontinence and pelvic/vaginal pressure  Musculoskeletal:  Positive for myalgias.  Skin: Negative.   Allergic/Immunologic: Negative for immunocompromised state.  Neurological:  Positive for weakness (muscle weakness, body ache).  Hematological:  Bruises/bleeds easily.  Psychiatric/Behavioral:  The patient is nervous/anxious (expected due to current medical problem).     Vital Signs: BP 100/73   Pulse 66   Temp  98.3 F (36.8 C)   Resp 16   Ht '5\' 1"'$  (1.549 m)   Wt 89 lb (40.4 kg)   LMP 10/06/2022   SpO2 99%   BMI 16.82 kg/m    Physical Exam Vitals reviewed.  Constitutional:      General: She is not in acute distress.    Appearance: Normal appearance. She is underweight. She is not ill-appearing.  HENT:     Head: Normocephalic and atraumatic.  Eyes:     Pupils: Pupils are equal, round, and reactive to light.  Cardiovascular:     Rate and Rhythm: Normal rate and regular rhythm.  Pulmonary:     Effort: Pulmonary effort is normal. No respiratory distress.  Neurological:     Mental Status: She is alert and oriented to person, place, and time.  Psychiatric:        Mood and Affect: Mood normal.  Behavior: Behavior normal.        Assessment/Plan: 1. Abnormal weight loss Referred to [facility] - Ambulatory referral to Gastroenterology  2. Decreased appetite Referred to [facility] GI. Also encouraged to restart her dronabinol or megestrol - Ambulatory referral to Gastroenterology  3. Diarrhea, unspecified type Referred to GI, continue lomotil as prescribed - Ambulatory referral to Gastroenterology  4. Generalized abdominal pain Referred to [facility] GI - Ambulatory referral to Gastroenterology   General Counseling: Rogue Bussing understanding of the findings of todays visit and agrees with plan of treatment. I have discussed any further diagnostic evaluation that may be needed or ordered today. We also reviewed her medications today. she has been encouraged to call the office with any questions or concerns that should arise related to todays visit.    Orders Placed This Encounter  Procedures   Ambulatory referral to Gastroenterology    No orders of the defined types were placed in this encounter.   Return in about 2 months (around 12/11/2022) for F/U, Sacha Radloff PCP.   Total time spent:30 Minutes Time spent includes review of chart, medications, test results, and follow up  plan with the patient.   [place] Controlled Substance Database was reviewed by me.  This patient was seen by Jonetta Osgood, FNP-C in collaboration with Dr. Clayborn Bigness as a part of collaborative care agreement.   Malessa Zartman R. Valetta Fuller, MSN, FNP-C Internal medicine

## 2022-10-12 NOTE — Telephone Encounter (Signed)
Spoke with pt she going to keep her appt today

## 2022-10-13 ENCOUNTER — Emergency Department
Admission: EM | Admit: 2022-10-13 | Discharge: 2022-10-13 | Disposition: A | Discharge status: Home / Self Care | Payer: 59 | Attending: Emergency Medicine | Admitting: Emergency Medicine

## 2022-10-13 ENCOUNTER — Other Ambulatory Visit: Payer: Self-pay

## 2022-10-13 ENCOUNTER — Telehealth: Payer: Self-pay | Admitting: Nurse Practitioner

## 2022-10-13 DIAGNOSIS — R112 Nausea with vomiting, unspecified: Secondary | ICD-10-CM

## 2022-10-13 DIAGNOSIS — R109 Unspecified abdominal pain: Secondary | ICD-10-CM | POA: Diagnosis present

## 2022-10-13 DIAGNOSIS — Z79899 Other long term (current) drug therapy: Secondary | ICD-10-CM | POA: Insufficient documentation

## 2022-10-13 DIAGNOSIS — F12988 Cannabis use, unspecified with other cannabis-induced disorder: Secondary | ICD-10-CM | POA: Diagnosis not present

## 2022-10-13 LAB — URINE DRUG SCREEN, QUALITATIVE (ARMC ONLY)
Amphetamines, Ur Screen: NOT DETECTED
Barbiturates, Ur Screen: NOT DETECTED
Benzodiazepine, Ur Scrn: NOT DETECTED
Cannabinoid 50 Ng, Ur ~~LOC~~: POSITIVE — AB
Cocaine Metabolite,Ur ~~LOC~~: NOT DETECTED
MDMA (Ecstasy)Ur Screen: NOT DETECTED
Methadone Scn, Ur: NOT DETECTED
Opiate, Ur Screen: POSITIVE — AB
Phencyclidine (PCP) Ur S: NOT DETECTED
Tricyclic, Ur Screen: NOT DETECTED

## 2022-10-13 LAB — COMPREHENSIVE METABOLIC PANEL
ALT: 21 U/L (ref 0–44)
AST: 25 U/L (ref 15–41)
Albumin: 4.1 g/dL (ref 3.5–5.0)
Alkaline Phosphatase: 62 U/L (ref 38–126)
Anion gap: 11 (ref 5–15)
BUN: 14 mg/dL (ref 6–20)
CO2: 18 mmol/L — ABNORMAL LOW (ref 22–32)
Calcium: 8.8 mg/dL — ABNORMAL LOW (ref 8.9–10.3)
Chloride: 106 mmol/L (ref 98–111)
Creatinine, Ser: 0.73 mg/dL (ref 0.44–1.00)
GFR, Estimated: >60 mL/min (ref >60)
Glucose, Bld: 114 mg/dL — ABNORMAL HIGH (ref 70–99)
Potassium: 3.1 mmol/L — ABNORMAL LOW (ref 3.5–5.1)
Sodium: 135 mmol/L (ref 135–145)
Total Bilirubin: 0.8 mg/dL (ref 0.3–1.2)
Total Protein: 7.2 g/dL (ref 6.5–8.1)

## 2022-10-13 LAB — URINALYSIS, ROUTINE W REFLEX MICROSCOPIC
Bacteria, UA: NONE SEEN
Bilirubin Urine: NEGATIVE
Glucose, UA: NEGATIVE mg/dL
Ketones, ur: 80 mg/dL — AB
Leukocytes,Ua: NEGATIVE
Nitrite: NEGATIVE
Protein, ur: NEGATIVE mg/dL
Specific Gravity, Urine: 1.018 (ref 1.005–1.030)
pH: 7 (ref 5.0–8.0)

## 2022-10-13 LAB — CBC WITH DIFFERENTIAL/PLATELET
Abs Immature Granulocytes: 0.02 10*3/uL (ref 0.00–0.07)
Basophils Absolute: 0 10*3/uL (ref 0.0–0.1)
Basophils Relative: 0 %
Eosinophils Absolute: 0 10*3/uL (ref 0.0–0.5)
Eosinophils Relative: 0 %
HCT: 32.2 % — ABNORMAL LOW (ref 36.0–46.0)
Hemoglobin: 11.2 g/dL — ABNORMAL LOW (ref 12.0–15.0)
Immature Granulocytes: 0 %
Lymphocytes Relative: 13 %
Lymphs Abs: 0.7 10*3/uL (ref 0.7–4.0)
MCH: 32.6 pg (ref 26.0–34.0)
MCHC: 34.8 g/dL (ref 30.0–36.0)
MCV: 93.6 fL (ref 80.0–100.0)
Monocytes Absolute: 0.3 10*3/uL (ref 0.1–1.0)
Monocytes Relative: 6 %
Neutro Abs: 4.5 10*3/uL (ref 1.7–7.7)
Neutrophils Relative %: 81 %
Platelets: 201 10*3/uL (ref 150–400)
RBC: 3.44 MIL/uL — ABNORMAL LOW (ref 3.87–5.11)
RDW: 11.8 % (ref 11.5–15.5)
WBC: 5.7 10*3/uL (ref 4.0–10.5)
nRBC: 0 % (ref 0.0–0.2)

## 2022-10-13 LAB — POC URINE PREG, ED: Preg Test, Ur: NEGATIVE

## 2022-10-13 LAB — PREGNANCY, URINE: Preg Test, Ur: NEGATIVE

## 2022-10-13 LAB — LIPASE, BLOOD: Lipase: 27 U/L (ref 11–51)

## 2022-10-13 MED ORDER — DIPHENHYDRAMINE HCL 50 MG/ML IJ SOLN
12.5000 mg | INTRAMUSCULAR | Status: AC
  Administered 2022-10-13: 12.5 mg via INTRAVENOUS
  Filled 2022-10-13: qty 1

## 2022-10-13 MED ORDER — DROPERIDOL 2.5 MG/ML IJ SOLN
2.5000 mg | Freq: Once | INTRAMUSCULAR | Status: AC
  Administered 2022-10-13: 2.5 mg via INTRAVENOUS
  Filled 2022-10-13: qty 2

## 2022-10-13 MED ORDER — PROMETHAZINE HCL 12.5 MG PO TABS: 12.5000 mg | ORAL_TABLET | Freq: Four times a day (QID) | ORAL | 0 refills | Status: DC | PRN

## 2022-10-13 MED ORDER — POTASSIUM CHLORIDE CRYS ER 20 MEQ PO TBCR
40.0000 meq | EXTENDED_RELEASE_TABLET | Freq: Once | ORAL | Status: AC
  Administered 2022-10-13: 40 meq via ORAL
  Filled 2022-10-13: qty 2

## 2022-10-13 MED ORDER — LACTATED RINGERS IV BOLUS
1000.0000 mL | Freq: Once | INTRAVENOUS | Status: AC
  Administered 2022-10-13: 1000 mL via INTRAVENOUS

## 2022-10-13 NOTE — ED Notes (Signed)
Patient seen in ED on Sunday for same abdominal pain. Pain described as sharp pain to RLQ radiating to back.

## 2022-10-13 NOTE — Telephone Encounter (Signed)
GI referral faxed Dr. Grafton Folk w/ [facility]; [phone]

## 2022-10-13 NOTE — Discharge Instructions (Addendum)
As we discussed, we strongly believe that your symptoms are the result of a condition called cannabinoid hyperemesis syndrome.  We strongly encourage you to completely stop the use of all marijuana and THC products.  Try to drink plenty of fluids with electrolytes and eat a bland diet.  We have prescribed a nausea medicine called Phenergan that should be helpful for you.  Please follow-up with your primary care doctor as well as with the GI specialist to whom you are referred.    Return to the emergency department if you develop new or worsening symptoms that concern you.

## 2022-10-13 NOTE — ED Triage Notes (Addendum)
Pt arrives via [facility] with CC of R sided abdominal pain. Seen two days ago for same. Pt took prescribed hydrocodone at 11pm last night and 2am this morning - denies any relief.

## 2022-10-13 NOTE — ED Provider Notes (Signed)
[facility] Provider Note    Event Date/Time   First MD Initiated Contact with Patient 10/13/22 [phone]     (approximate)   History   Abdominal Pain   HPI  Mariah Bird is a 37 y.o. female who presents for evaluation of severe pain in her right side and flank.  She was seen in the emergency department 2 days ago for the same and has also followed up with her primary care doctor yesterday.  She said that the pain was worse tonight and intolerable so she came into the emergency department by EMS.  Her mother is currently with her at bedside and provides much of the history because of the patient moaning in pain.  The patient and her mother did not provide the information, but review of her recent PCP note indicates that the patient has had weight loss over the last several months and has decreased appetite as well as diarrhea.  She takes some appetite stimulants as well as occasional Lomotil.  She is being set up with a referral to [facility] gastroenterology to try to get to the bottom of her issues.  Her mother reports that she had a colonoscopy and endoscopy relatively recently that did not identify any specific symptoms.  The patient is moaning and writhing back-and-forth in the bed although she will answer some questions.  She reports the pain is too bad for her to walk to the toilet in her room or urinate without assistance.  She reports nausea and acted like she was going to vomit but ended up just spitting in the bag.       Physical Exam   Triage Vital Signs: ED Triage Vitals  Enc Vitals Group     BP 10/13/22 0512 (!) 121/95     Pulse Rate 10/13/22 0512 70     Resp 10/13/22 0512 (!) 32     Temp 10/13/22 0512 (!) 97.5 F (36.4 C)     Temp Source 10/13/22 0512 Oral     SpO2 10/13/22 0508 98 %     Weight 10/13/22 0513 40.4 kg (89 lb)     Height 10/13/22 0513 1.549 m ('5\' 1"'$ )     Head Circumference --      Peak Flow --      Pain Score 10/13/22 0513 10      Pain Loc --      Pain Edu? --      Excl. in [place]? --     Most recent vital signs: Vitals:   10/13/22 0508 10/13/22 0512  BP:  (!) 121/95  Pulse:  70  Resp:  (!) 32  Temp:  (!) 97.5 F (36.4 C)  SpO2: 98% 100%     General: Awake, alert, moaning and rolling around in bed in pain. CV:  Good peripheral perfusion.  No tachycardia.  Normal heart sounds, regular rhythm. Resp:  When answering questions she is speaking normally and easily with no respiratory distress.  Lungs are clear to auscultation. Abd:  No distention.  Thin body habitus.  Patient is flexing her body and has a hard time holding still.  She has a benign abdominal exam although it is hard to be certain because she will not relax enough for me to palpate adequately or appropriately.  However she does not respond differently to any 1 particular area and I believe that the tenseness I am feeling is due to flexion of her abdominal muscles rather than a peritoneal abdomen.  ED Results / Procedures / Treatments   Labs (all labs ordered are listed, but only abnormal results are displayed) Labs Reviewed  URINALYSIS, ROUTINE W REFLEX MICROSCOPIC - Abnormal; Notable for the following components:      Result Value   Color, Urine YELLOW (*)    APPearance CLOUDY (*)    Hgb urine dipstick SMALL (*)    Ketones, ur 80 (*)    All other components within normal limits  CBC WITH DIFFERENTIAL/PLATELET - Abnormal; Notable for the following components:   RBC 3.44 (*)    Hemoglobin 11.2 (*)    HCT 32.2 (*)    All other components within normal limits  COMPREHENSIVE METABOLIC PANEL - Abnormal; Notable for the following components:   Potassium 3.1 (*)    CO2 18 (*)    Glucose, Bld 114 (*)    Calcium 8.8 (*)    All other components within normal limits  URINE DRUG SCREEN, QUALITATIVE ([facility] ONLY) - Abnormal; Notable for the following components:   Opiate, Ur Screen POSITIVE (*)    Cannabinoid 50 Ng, Ur [place] POSITIVE (*)    All other  components within normal limits  PREGNANCY, URINE  LIPASE, BLOOD  POC URINE PREG, ED      PROCEDURES:  Critical Care performed: No  Procedures   MEDICATIONS ORDERED IN ED: Medications  droperidol (INAPSINE) 2.5 MG/ML injection 2.5 mg (2.5 mg Intravenous Given 10/13/22 0619)  diphenhydrAMINE (BENADRYL) injection 12.5 mg (12.5 mg Intravenous Given 10/13/22 0619)  lactated ringers bolus 1,000 mL (1,000 mLs Intravenous New Bag/Given 10/13/22 0621)     IMPRESSION / MDM / ASSESSMENT AND PLAN / ED COURSE  I reviewed the triage vital signs and the nursing notes.                              Differential diagnosis includes, but is not limited to, cannabinoid hyperemesis syndrome, renal/ureteral colic, ovarian torsion, cyclic vomiting syndrome, much less likely pulmonary embolism.  Patient's presentation is most consistent with acute presentation with potential threat to life or bodily function.  Labs/studies ordered: Urinalysis, urine pregnancy test, urine drug screen which I ordered to see if the patient could be suffering from cannabinoid hyperemesis syndrome, CMP, CBC with differential, lipase. Interventions/Medications given: Droperidol 2.5 mg IV, diphenhydramine 12.5 mg IV,  [facility] Course my include additional interventions not listed in this section:)     Clinical Course as of 10/13/22 0721  Tue Oct 13, 2022  [id number] Cannabinoid 50 Ng, Ur [place](!): POSITIVE [CF]  [phone] Opiate, Ur Screen(!): POSITIVE [CF]  [phone] I reassessed the patient and she is sleeping comfortably but awakened easily to soft voice and light touch.  Her mother is still at bedside.  I talked about all her results which are generally reassuring.  Her mother is still concerned about what could be causing symptoms.  I had my usual and customary discussion about cannabinoid hyperemesis syndrome.  When I brought it up by name, the patient started nodding immediately.  She said that she has cut back on her marijuana use  but she still smokes daily.  I explained that it is very likely what is the cause of her symptoms.  I encouraged them to continue to follow-up with gastroenterology at [facility], but I strongly encouraged her to stop smoking marijuana completely.  I will give her a prescription for Phenergan p.o. tablets.  She and her mother not in agreement and are willing to  consider this diagnosis.  At this point her pain is well-controlled and there is no indication for imaging.  I feel she is appropriate for discharge once her fluids are done and for close outpatient follow-up.  I gave my usual return precautions. [CF]    Clinical Course User Index [CF] Hinda Kehr, MD     FINAL CLINICAL IMPRESSION(S) / ED DIAGNOSES   Final diagnoses:  Right sided abdominal pain  Cannabinoid hyperemesis syndrome     Rx / DC Orders   ED Discharge Orders          Ordered    promethazine (PHENERGAN) 12.5 MG tablet  Every 6 hours PRN        10/13/22 0720             Note:  This document was prepared using Dragon voice recognition software and may include unintentional dictation errors.   Hinda Kehr, MD 10/13/22 [phone]

## 2022-10-15 ENCOUNTER — Emergency Department (HOSPITAL_BASED_OUTPATIENT_CLINIC_OR_DEPARTMENT_OTHER): Payer: 59

## 2022-10-15 ENCOUNTER — Encounter: Payer: Self-pay | Admitting: Nurse Practitioner

## 2022-10-15 ENCOUNTER — Emergency Department (HOSPITAL_BASED_OUTPATIENT_CLINIC_OR_DEPARTMENT_OTHER)
Admission: EM | Admit: 2022-10-15 | Discharge: 2022-10-15 | Disposition: A | Discharge status: Home / Self Care | Payer: 59 | Attending: Emergency Medicine | Admitting: Emergency Medicine

## 2022-10-15 ENCOUNTER — Encounter (HOSPITAL_BASED_OUTPATIENT_CLINIC_OR_DEPARTMENT_OTHER): Payer: Self-pay | Admitting: Emergency Medicine

## 2022-10-15 ENCOUNTER — Other Ambulatory Visit: Payer: Self-pay

## 2022-10-15 DIAGNOSIS — R1031 Right lower quadrant pain: Secondary | ICD-10-CM | POA: Insufficient documentation

## 2022-10-15 DIAGNOSIS — R319 Hematuria, unspecified: Secondary | ICD-10-CM | POA: Diagnosis not present

## 2022-10-15 DIAGNOSIS — R109 Unspecified abdominal pain: Secondary | ICD-10-CM

## 2022-10-15 LAB — URINALYSIS, ROUTINE W REFLEX MICROSCOPIC
Bilirubin Urine: NEGATIVE
Glucose, UA: NEGATIVE mg/dL
Ketones, ur: >80 mg/dL — AB
Nitrite: NEGATIVE
Protein, ur: 30 mg/dL — AB
RBC / HPF: >50 RBC/hpf (ref 0–5)
Specific Gravity, Urine: 1.026 (ref 1.005–1.030)
pH: 7 (ref 5.0–8.0)

## 2022-10-15 MED ORDER — HYDROMORPHONE HCL 1 MG/ML IJ SOLN
1.0000 mg | Freq: Once | INTRAMUSCULAR | Status: AC
  Administered 2022-10-15: 1 mg via INTRAVENOUS
  Filled 2022-10-15: qty 1

## 2022-10-15 MED ORDER — HYDROCODONE-ACETAMINOPHEN 5-325 MG PO TABS: 1.0000 | ORAL_TABLET | ORAL | 0 refills | Status: DC | PRN

## 2022-10-15 MED ORDER — KETOROLAC TROMETHAMINE 15 MG/ML IJ SOLN
15.0000 mg | Freq: Once | INTRAMUSCULAR | Status: AC
  Administered 2022-10-15: 15 mg via INTRAVENOUS
  Filled 2022-10-15: qty 1

## 2022-10-15 MED ORDER — SODIUM CHLORIDE 0.9 % IV BOLUS
1000.0000 mL | Freq: Once | INTRAVENOUS | Status: AC
  Administered 2022-10-15: 1000 mL via INTRAVENOUS

## 2022-10-15 MED ORDER — ONDANSETRON HCL 4 MG/2ML IJ SOLN
4.0000 mg | Freq: Once | INTRAMUSCULAR | Status: AC
  Administered 2022-10-15: 4 mg via INTRAVENOUS
  Filled 2022-10-15: qty 2

## 2022-10-15 NOTE — ED Triage Notes (Signed)
Pt in with R flank pain, had a CT on 2/25 that showed a 1-2 mm stone in R kidney. Pt reports pain has worsened, and she is now having hematuria

## 2022-10-15 NOTE — ED Provider Notes (Signed)
[facility] Provider Note: Georgena Spurling, MD, FACEP  CSN: [id number] MRN: [id number] ARRIVAL: 10/15/22 at [place]: [id number]   CHIEF COMPLAINT  Flank Pain   HISTORY OF PRESENT ILLNESS  10/15/22 3:51 AM Mariah Bird is a 37 y.o. female who was seen in the ED on 10/13/2022 for right-sided abdominal pain.  CT scan showed a 1 to 2 mm stone in the right kidney but no ureterolithiasis.  She was diagnosed with cannabinoid hyperemesis syndrome and prescribed promethazine.  She had been previously seen on 10/11/2022 for the same pain and was prescribed hydrocodone/APAP.  She returns with increased right flank pain now associated with hematuria.  She rates her pain as a 10 out of 10.  It is worse with movement or palpation.   Past Medical History:  Diagnosis Date   Anxiety    Depression    Hyperthyroidism    Nephrolithiasis     Past Surgical History:  Procedure Laterality Date   KNEE ARTHROSCOPY Right    NASAL SINUS SURGERY  12/18/2016    Family History  Problem Relation Age of Onset   Lupus Sister    Kidney disease Maternal Uncle    Kidney Stones Paternal Grandmother    Bladder Cancer Neg Hx    Kidney cancer Neg Hx    Prostate cancer Neg Hx     Social History   Tobacco Use   Smoking status: Never   Smokeless tobacco: Never  Vaping Use   Vaping Use: Never used  Substance Use Topics   Alcohol use: Not Currently    Comment: occ   Drug use: Yes    Types: Marijuana    Comment: occ    Prior to Admission medications   Medication Sig Start Date End Date Taking? Authorizing Provider  citalopram (CELEXA) 20 MG tablet Take 1 tablet (20 mg total) by mouth daily. 11/20/21   Jonetta Osgood, NP  diphenoxylate-atropine (LOMOTIL) 2.5-0.025 MG tablet Take 1 tablet by mouth 4 (four) times daily as needed for diarrhea or loose stools. 11/20/21   Jonetta Osgood, NP  doxycycline (MONODOX) 100 MG capsule Take 1 capsule (100 mg total) by mouth 2 (two) times daily.  Doxycycline should be taken with food to prevent nausea. Do not lay down for 30 minutes after taking. Be cautious with sun exposure and use good sun protection while on this medication. Pregnant women should not take this medication. 06/25/22   Moye, [place], MD  dronabinol (MARINOL) 5 MG capsule Take 1 capsule (5 mg total) by mouth 2 (two) times daily before lunch and supper. Take 1 hour before each meal (except breakfast) 02/12/22   Jonetta Osgood, NP  HYDROcodone-acetaminophen (NORCO/VICODIN) 5-325 MG tablet Take 1 tablet by mouth every 4 (four) hours as needed. 10/15/22   Mehtaab Mayeda, MD  hydrocortisone (ANUSOL-HC) 2.5 % rectal cream Place 1 Application rectally 2 (two) times daily. 05/18/22   Piscoya, Jacqulyn Bath, MD  lidocaine (XYLOCAINE) 5 % ointment Apply 1 Application topically as needed. 05/18/22   Olean Ree, MD  loperamide (IMODIUM) 2 MG capsule Take 1 capsule (2 mg total) by mouth 4 (four) times daily as needed for diarrhea or loose stools. 04/23/22   Redwine, Madison A, PA-C  megestrol (MEGACE) 40 MG tablet Take 2 tablets (80 mg total) by mouth 2 (two) times daily. 11/20/21   Jonetta Osgood, NP  mirtazapine (REMERON) 15 MG tablet Take 1 tablet (15 mg total) by mouth at bedtime. 07/28/22   Jonetta Osgood, NP  mupirocin ointment (BACTROBAN) 2 %  Apply 1 Application topically daily. 06/04/22   Moye, [place], MD  ondansetron (ZOFRAN-ODT) 4 MG disintegrating tablet Take 1 tablet (4 mg total) by mouth every 8 (eight) hours as needed for nausea or vomiting. 04/23/22   Redwine, Madison A, PA-C  pantoprazole (PROTONIX) 40 MG tablet Take 1 tablet (40 mg total) by mouth daily. 04/23/22 05/23/22  Redwine, Madison A, PA-C  promethazine (PHENERGAN) 12.5 MG tablet Take 1 tablet (12.5 mg total) by mouth every 6 (six) hours as needed for nausea or vomiting. 10/13/22   Hinda Kehr, MD  sucralfate (CARAFATE) 1 g tablet Take 1 g by mouth 3 (three) times daily. 06/24/22   [provider]  triamcinolone  ointment (KENALOG) 0.1 % Apply twice daily to affected areas as needed for rash/itching. Avoid applying to face, groin, and axilla. 06/30/22   Moye, [place], MD  Vitamin D, Ergocalciferol, (DRISDOL) 1.25 MG (50000 UNIT) CAPS capsule Take 1 capsule (50,000 Units total) by mouth every 7 (seven) days. 08/31/22   Jonetta Osgood, NP    Allergies Patient has no known allergies.   REVIEW OF SYSTEMS  Negative except as noted here or in the History of Present Illness.   PHYSICAL EXAMINATION  Initial Vital Signs Blood pressure (!) 141/96, pulse 70, temperature 98.4 F (36.9 C), temperature source Oral, resp. rate (!) 28, weight 40.4 kg, last menstrual period 10/06/2022, SpO2 100 %.  Examination General: Well-developed, well-nourished female in no acute distress; appearance consistent with age of record HENT: normocephalic; atraumatic Eyes: Normal appearance Neck: supple Heart: regular rate and rhythm Lungs: clear to auscultation bilaterally Abdomen: soft; nondistended; nontender GU: Right CVA tenderness Extremities: No deformity; full range of motion Neurologic: Awake, alert and oriented; motor function intact in all extremities and symmetric; no facial droop Skin: Warm and dry Psychiatric: Moaning   RESULTS  Summary of this visit's results, reviewed and interpreted by myself:   EKG Interpretation  Date/Time:    Ventricular Rate:    PR Interval:    QRS Duration:   QT Interval:    QTC Calculation:   R Axis:     Text Interpretation:         Laboratory Studies: Results for orders placed or performed during the hospital encounter of 10/15/22 (from the past 24 hour(s))  Urinalysis, Routine w reflex microscopic -Urine, Clean Catch     Status: Abnormal   Collection Time: 10/15/22  5:06 AM  Result Value Ref Range   Color, Urine YELLOW YELLOW   APPearance HAZY (A) CLEAR   Specific Gravity, Urine 1.026 1.005 - 1.030   pH 7.0 5.0 - 8.0   Glucose, UA NEGATIVE NEGATIVE mg/dL    Hgb urine dipstick MODERATE (A) NEGATIVE   Bilirubin Urine NEGATIVE NEGATIVE   Ketones, ur >80 (A) NEGATIVE mg/dL   Protein, ur 30 (A) NEGATIVE mg/dL   Nitrite NEGATIVE NEGATIVE   Leukocytes,Ua TRACE (A) NEGATIVE   RBC / HPF >50 0 - 5 RBC/hpf   WBC, UA 6-10 0 - 5 WBC/hpf   Bacteria, UA RARE (A) NONE SEEN   Squamous Epithelial / HPF 21-50 0 - 5 /HPF   Mucus PRESENT    Imaging Studies: US Renal  Result Date: 10/15/2022 CLINICAL DATA:  Right flank pain. EXAM: RENAL / URINARY TRACT ULTRASOUND COMPLETE COMPARISON:  CT stone study 10/11/2022 FINDINGS: Right Kidney: Renal measurements: 10.8 x 5.2 x 5.4 cm = volume: 159 mL. Echogenicity within normal limits. No mass or hydronephrosis visualized. The tiny right renal stone seen on recent CT  is not evident on ultrasound. Left Kidney: Renal measurements: 11.0 x 6.2 x 5.9 cm = volume: 209 mL. Echogenicity within normal limits. No mass or hydronephrosis visualized. Bladder: Appears normal for degree of bladder distention. Other: None. IMPRESSION: 1. No hydronephrosis. 2. The tiny right renal stone seen on recent CT is not evident on ultrasound. Electronically Signed   By: Misty Stanley M.D.   On: 10/15/2022 05:51    ED COURSE and MDM  Nursing notes, initial and subsequent vitals signs, including pulse oximetry, reviewed and interpreted by myself.  Vitals:   10/15/22 0251 10/15/22 0252  BP: (!) 141/96   Pulse: 70   Resp: (!) 28   Temp: 98.4 F (36.9 C)   TempSrc: Oral   SpO2: 100%   Weight:  40.4 kg   Medications  ondansetron (ZOFRAN) injection 4 mg (4 mg Intravenous Given 10/15/22 0330)  HYDROmorphone (DILAUDID) injection 1 mg (1 mg Intravenous Given 10/15/22 0329)  ketorolac (TORADOL) 15 MG/ML injection 15 mg (15 mg Intravenous Given 10/15/22 0408)  sodium chloride 0.9 % bolus 1,000 mL (0 mLs Intravenous Stopped 10/15/22 0505)   5:57 AM Patient has been sleeping peacefully after medications.  Ultrasound does not show any evidence of  hydronephrosis nor is the right renal stone seen on recent CT scan seen on the ultrasound.  The patient has hematuria on her urinalysis.  She may very well have passed the stone or be be passing it.  It is a small stone and I would expect it to pass soon and if it has not passed already.   PROCEDURES  Procedures   ED DIAGNOSES     ICD-10-CM   1. Acute right flank pain  R10.9          Jamail Cullers, Jenny Reichmann, MD 10/15/22 [phone]

## 2022-10-16 ENCOUNTER — Ambulatory Visit: Payer: 59 | Admitting: Nurse Practitioner

## 2022-10-17 ENCOUNTER — Emergency Department (HOSPITAL_BASED_OUTPATIENT_CLINIC_OR_DEPARTMENT_OTHER): Payer: 59

## 2022-10-17 ENCOUNTER — Other Ambulatory Visit: Payer: Self-pay

## 2022-10-17 ENCOUNTER — Emergency Department (HOSPITAL_BASED_OUTPATIENT_CLINIC_OR_DEPARTMENT_OTHER)
Admission: EM | Admit: 2022-10-17 | Discharge: 2022-10-17 | Disposition: A | Discharge status: Home / Self Care | Payer: 59 | Attending: Emergency Medicine | Admitting: Emergency Medicine

## 2022-10-17 ENCOUNTER — Encounter (HOSPITAL_BASED_OUTPATIENT_CLINIC_OR_DEPARTMENT_OTHER): Payer: Self-pay | Admitting: Emergency Medicine

## 2022-10-17 DIAGNOSIS — N201 Calculus of ureter: Secondary | ICD-10-CM

## 2022-10-17 DIAGNOSIS — N132 Hydronephrosis with renal and ureteral calculous obstruction: Secondary | ICD-10-CM | POA: Diagnosis not present

## 2022-10-17 DIAGNOSIS — R109 Unspecified abdominal pain: Secondary | ICD-10-CM | POA: Diagnosis present

## 2022-10-17 DIAGNOSIS — E039 Hypothyroidism, unspecified: Secondary | ICD-10-CM | POA: Diagnosis not present

## 2022-10-17 LAB — URINALYSIS, ROUTINE W REFLEX MICROSCOPIC
Bilirubin Urine: NEGATIVE
Glucose, UA: NEGATIVE mg/dL
Hgb urine dipstick: NEGATIVE
Ketones, ur: >80 mg/dL — AB
Leukocytes,Ua: NEGATIVE
Nitrite: NEGATIVE
Protein, ur: NEGATIVE mg/dL
Specific Gravity, Urine: 1.017 (ref 1.005–1.030)
pH: 6.5 (ref 5.0–8.0)

## 2022-10-17 LAB — CBC WITH DIFFERENTIAL/PLATELET
Abs Immature Granulocytes: 0.01 10*3/uL (ref 0.00–0.07)
Basophils Absolute: 0 10*3/uL (ref 0.0–0.1)
Basophils Relative: 1 %
Eosinophils Absolute: 0 10*3/uL (ref 0.0–0.5)
Eosinophils Relative: 0 %
HCT: 40 % (ref 36.0–46.0)
Hemoglobin: 14.1 g/dL (ref 12.0–15.0)
Immature Granulocytes: 0 %
Lymphocytes Relative: 17 %
Lymphs Abs: 1 10*3/uL (ref 0.7–4.0)
MCH: 32.1 pg (ref 26.0–34.0)
MCHC: 35.3 g/dL (ref 30.0–36.0)
MCV: 91.1 fL (ref 80.0–100.0)
Monocytes Absolute: 0.5 10*3/uL (ref 0.1–1.0)
Monocytes Relative: 7 %
Neutro Abs: 4.6 10*3/uL (ref 1.7–7.7)
Neutrophils Relative %: 75 %
Platelets: 233 10*3/uL (ref 150–400)
RBC: 4.39 MIL/uL (ref 3.87–5.11)
RDW: 11.8 % (ref 11.5–15.5)
WBC: 6.1 10*3/uL (ref 4.0–10.5)
nRBC: 0 % (ref 0.0–0.2)

## 2022-10-17 LAB — LIPASE, BLOOD: Lipase: 13 U/L (ref 11–51)

## 2022-10-17 LAB — COMPREHENSIVE METABOLIC PANEL
ALT: 14 U/L (ref 0–44)
AST: 15 U/L (ref 15–41)
Albumin: 4.8 g/dL (ref 3.5–5.0)
Alkaline Phosphatase: 59 U/L (ref 38–126)
Anion gap: 15 (ref 5–15)
BUN: 13 mg/dL (ref 6–20)
CO2: 21 mmol/L — ABNORMAL LOW (ref 22–32)
Calcium: 10.2 mg/dL (ref 8.9–10.3)
Chloride: 101 mmol/L (ref 98–111)
Creatinine, Ser: 0.67 mg/dL (ref 0.44–1.00)
GFR, Estimated: >60 mL/min (ref >60)
Glucose, Bld: 85 mg/dL (ref 70–99)
Potassium: 3.8 mmol/L (ref 3.5–5.1)
Sodium: 137 mmol/L (ref 135–145)
Total Bilirubin: 0.7 mg/dL (ref 0.3–1.2)
Total Protein: 8.6 g/dL — ABNORMAL HIGH (ref 6.5–8.1)

## 2022-10-17 LAB — HCG, SERUM, QUALITATIVE: Preg, Serum: NEGATIVE

## 2022-10-17 MED ORDER — SODIUM CHLORIDE 0.9 % IV SOLN: INTRAVENOUS | Status: DC

## 2022-10-17 MED ORDER — TAMSULOSIN HCL 0.4 MG PO CAPS: 0.4000 mg | ORAL_CAPSULE | Freq: Every day | ORAL | 0 refills | Status: AC

## 2022-10-17 MED ORDER — ONDANSETRON HCL 4 MG/2ML IJ SOLN: 4.0000 mg | Freq: Once | INTRAMUSCULAR | Status: DC

## 2022-10-17 MED ORDER — OXYCODONE-ACETAMINOPHEN 5-325 MG PO TABS: 1.0000 | ORAL_TABLET | Freq: Four times a day (QID) | ORAL | 0 refills | Status: DC | PRN

## 2022-10-17 MED ORDER — SODIUM CHLORIDE 0.9 % IV BOLUS
1000.0000 mL | Freq: Once | INTRAVENOUS | Status: AC
  Administered 2022-10-17: 1000 mL via INTRAVENOUS

## 2022-10-17 MED ORDER — DROPERIDOL 2.5 MG/ML IJ SOLN
2.5000 mg | Freq: Once | INTRAMUSCULAR | Status: AC
  Administered 2022-10-17: 2.5 mg via INTRAVENOUS
  Filled 2022-10-17: qty 2

## 2022-10-17 MED ORDER — FENTANYL CITRATE PF 50 MCG/ML IJ SOSY
50.0000 ug | PREFILLED_SYRINGE | Freq: Once | INTRAMUSCULAR | Status: AC
  Administered 2022-10-17: 50 ug via INTRAVENOUS
  Filled 2022-10-17: qty 1

## 2022-10-17 MED ORDER — KETOROLAC TROMETHAMINE 15 MG/ML IJ SOLN
15.0000 mg | Freq: Once | INTRAMUSCULAR | Status: AC
  Administered 2022-10-17: 15 mg via INTRAVENOUS
  Filled 2022-10-17: qty 1

## 2022-10-17 NOTE — ED Triage Notes (Signed)
Pt reports right sided flank pain with nausea and vomiting. Pt reports she was dx with kidney stone 2 days ago.

## 2022-10-17 NOTE — ED Provider Notes (Signed)
[facility] Provider Note   CSN: [id number] Arrival date & time: 10/17/22  [id number]     History  Chief Complaint  Patient presents with   Flank Pain    Mariah Bird is a 37 y.o. female.   Flank Pain     37 year old female with medical history significant for anxiety, depression, nephrolithiasis, hypothyroidism who presents to the emergency department with flank pain.  On chart review, the patient has been seen in the emergency department 3 times in the past week for a similar complaint with nausea and vomiting.  She has had CT imaging that has been negative for active nephrolithiasis.  She admits to cannabis use daily and has a diagnosis of cannabis hyperemesis syndrome.  She underwent CT imaging on 10/11/2022 which revealed a tiny right renal calculus with no evidence of ureteric calculi, hydronephrosis or acute findings.  She represented to the emergency department on 2/29 and renal ultrasound revealed no hydronephrosis.   The patient today states that she has been having right-sided flank pain ever since 25 February.  She states that has been persistent.  She states that the [place] she was prescribed has been ineffective. Remainder of history is somewhat limited by the patient's pain.   Home Medications Prior to Admission medications   Medication Sig Start Date End Date Taking? Authorizing Provider  oxyCODONE-acetaminophen (PERCOCET/ROXICET) 5-325 MG tablet Take 1 tablet by mouth every 6 (six) hours as needed for severe pain. 10/17/22  Yes Regan Lemming, MD  tamsulosin (FLOMAX) 0.4 MG CAPS capsule Take 1 capsule (0.4 mg total) by mouth daily for 5 days. 10/17/22 10/22/22 Yes Regan Lemming, MD  citalopram (CELEXA) 20 MG tablet Take 1 tablet (20 mg total) by mouth daily. 11/20/21   Jonetta Osgood, NP  diphenoxylate-atropine (LOMOTIL) 2.5-0.025 MG tablet Take 1 tablet by mouth 4 (four) times daily as needed for diarrhea or loose stools. 11/20/21    Jonetta Osgood, NP  doxycycline (MONODOX) 100 MG capsule Take 1 capsule (100 mg total) by mouth 2 (two) times daily. Doxycycline should be taken with food to prevent nausea. Do not lay down for 30 minutes after taking. Be cautious with sun exposure and use good sun protection while on this medication. Pregnant women should not take this medication. 06/25/22   Moye, [place], MD  dronabinol (MARINOL) 5 MG capsule Take 1 capsule (5 mg total) by mouth 2 (two) times daily before lunch and supper. Take 1 hour before each meal (except breakfast) 02/12/22   Jonetta Osgood, NP  HYDROcodone-acetaminophen (NORCO/VICODIN) 5-325 MG tablet Take 1 tablet by mouth every 4 (four) hours as needed. 10/15/22   Molpus, John, MD  hydrocortisone (ANUSOL-HC) 2.5 % rectal cream Place 1 Application rectally 2 (two) times daily. 05/18/22   Piscoya, Jacqulyn Bath, MD  lidocaine (XYLOCAINE) 5 % ointment Apply 1 Application topically as needed. 05/18/22   Olean Ree, MD  loperamide (IMODIUM) 2 MG capsule Take 1 capsule (2 mg total) by mouth 4 (four) times daily as needed for diarrhea or loose stools. 04/23/22   Redwine, Madison A, PA-C  megestrol (MEGACE) 40 MG tablet Take 2 tablets (80 mg total) by mouth 2 (two) times daily. 11/20/21   Jonetta Osgood, NP  mirtazapine (REMERON) 15 MG tablet Take 1 tablet (15 mg total) by mouth at bedtime. 07/28/22   Jonetta Osgood, NP  mupirocin ointment (BACTROBAN) 2 % Apply 1 Application topically daily. 06/04/22   Moye, [place], MD  ondansetron (ZOFRAN-ODT) 4 MG disintegrating tablet Take 1  tablet (4 mg total) by mouth every 8 (eight) hours as needed for nausea or vomiting. 04/23/22   Redwine, Madison A, PA-C  pantoprazole (PROTONIX) 40 MG tablet Take 1 tablet (40 mg total) by mouth daily. 04/23/22 05/23/22  Redwine, Madison A, PA-C  promethazine (PHENERGAN) 12.5 MG tablet Take 1 tablet (12.5 mg total) by mouth every 6 (six) hours as needed for nausea or vomiting. 10/13/22   Hinda Kehr, MD   sucralfate (CARAFATE) 1 g tablet Take 1 g by mouth 3 (three) times daily. 06/24/22   [provider]  triamcinolone ointment (KENALOG) 0.1 % Apply twice daily to affected areas as needed for rash/itching. Avoid applying to face, groin, and axilla. 06/30/22   Moye, [place], MD  Vitamin D, Ergocalciferol, (DRISDOL) 1.25 MG (50000 UNIT) CAPS capsule Take 1 capsule (50,000 Units total) by mouth every 7 (seven) days. 08/31/22   Jonetta Osgood, NP      Allergies    Patient has no known allergies.    Review of Systems   Review of Systems  Unable to perform ROS: Acuity of condition  Gastrointestinal:  Positive for nausea and vomiting.  Genitourinary:  Positive for flank pain.    Physical Exam Updated Vital Signs BP 106/61   Pulse (!) 59   Temp 98 F (36.7 C) (Oral)   Resp 17   LMP 10/06/2022   SpO2 99%  Physical Exam Vitals and nursing note reviewed.  Constitutional:      General: She is in acute distress.     Comments: Writhing in pain, unable to get comfortable  HENT:     Head: Normocephalic and atraumatic.  Eyes:     Conjunctiva/sclera: Conjunctivae normal.     Pupils: Pupils are equal, round, and reactive to light.  Cardiovascular:     Rate and Rhythm: Normal rate and regular rhythm.  Pulmonary:     Effort: Pulmonary effort is normal. No respiratory distress.  Abdominal:     General: There is no distension.     Tenderness: There is abdominal tenderness. There is right CVA tenderness and guarding. There is no left CVA tenderness.     Comments: Pt screams with any palpation to light touch, appears to be more of a focus of pain in the RLQ, RUQ, and right flank.   Musculoskeletal:        General: No deformity or signs of injury.     Cervical back: Neck supple.  Skin:    Findings: No lesion or rash.  Neurological:     General: No focal deficit present.     Mental Status: She is alert. Mental status is at baseline.     ED Results / Procedures / Treatments    Labs (all labs ordered are listed, but only abnormal results are displayed) Labs Reviewed  URINALYSIS, ROUTINE W REFLEX MICROSCOPIC - Abnormal; Notable for the following components:      Result Value   Color, Urine COLORLESS (*)    Ketones, ur >80 (*)    All other components within normal limits  COMPREHENSIVE METABOLIC PANEL - Abnormal; Notable for the following components:   CO2 21 (*)    Total Protein 8.6 (*)    All other components within normal limits  LIPASE, BLOOD  CBC WITH DIFFERENTIAL/PLATELET  HCG, SERUM, QUALITATIVE    EKG None  Radiology CT RENAL STONE STUDY  Result Date: 10/17/2022 CLINICAL DATA:  Right-sided flank pain. EXAM: CT ABDOMEN AND PELVIS WITHOUT CONTRAST TECHNIQUE: Multidetector CT imaging of the abdomen  and pelvis was performed following the standard protocol without IV contrast. RADIATION DOSE REDUCTION: This exam was performed according to the departmental dose-optimization program which includes automated exposure control, adjustment of the mA and/or kV according to patient size and/or use of iterative reconstruction technique. COMPARISON:  CT scans of the abdomen March 31, 2016, April 13, 2022, and October 11, 2022. FINDINGS: Lower chest: No acute abnormality. Hepatobiliary: No focal liver abnormality is seen. No gallstones, gallbladder wall thickening, or biliary dilatation. Pancreas: Unremarkable. No pancreatic ductal dilatation or surrounding inflammatory changes. Spleen: Normal in size without focal abnormality. Adrenals/Urinary Tract: Adrenal glands are normal. The left kidney is normal with no stones, masses, hydronephrosis, or perinephric stranding. No left ureteral abnormalities are identified. At least 3 punctate stones are identified in the right kidney. There is mild right hydronephrosis not identified previously. There is trace perinephric stranding appreciated on coronal imaging. There is a calcification in the right pelvis measuring 3 mm on series  2, image 59 and coronal image 33. This calcification is stable since October 10, 2022. The bladder is unremarkable. Stomach/Bowel: The stomach and small bowel are normal. The colon is normal. The appendix is normal. Vascular/Lymphatic: No significant vascular findings are present. No enlarged abdominal or pelvic lymph nodes. Reproductive: Uterus and bilateral adnexa are unremarkable. Other: No abdominal wall hernia or abnormality. No abdominopelvic ascites. Musculoskeletal: No acute or significant osseous findings. IMPRESSION: 1. There is mild right hydronephrosis and trace perinephric stranding, new in the interval. There is a 3 mm calcification in the right pelvis which could represent a distal ureteral stone. 2. At least 3 punctate stones are identified in the right kidney. 3. No other abnormalities. Electronically Signed   By: Dorise Bullion III M.D.   On: 10/17/2022 10:01    Procedures Procedures    Medications Ordered in ED Medications  sodium chloride 0.9 % bolus 1,000 mL (1,000 mLs Intravenous New Bag/Given 10/17/22 0753)    And  0.9 %  sodium chloride infusion ( Intravenous New Bag/Given 10/17/22 0856)  fentaNYL (SUBLIMAZE) injection 50 mcg (50 mcg Intravenous Given 10/17/22 0743)  droperidol (INAPSINE) 2.5 MG/ML injection 2.5 mg (2.5 mg Intravenous Given 10/17/22 0744)  fentaNYL (SUBLIMAZE) injection 50 mcg (50 mcg Intravenous Given 10/17/22 0856)  ketorolac (TORADOL) 15 MG/ML injection 15 mg (15 mg Intravenous Given 10/17/22 1000)    ED Course/ Medical Decision Making/ A&P                             Medical Decision Making Amount and/or Complexity of Data Reviewed Labs: ordered. Radiology: ordered.  Risk Prescription drug management.    37 year old female with medical history significant for anxiety, depression, nephrolithiasis, hypothyroidism who presents to the emergency department with flank pain.  On chart review, the patient has been seen in the emergency department 3 times in  the past week for a similar complaint with nausea and vomiting.  She has had CT imaging that has been negative for active nephrolithiasis.  She admits to cannabis use daily and has a diagnosis of cannabis hyperemesis syndrome.  She underwent CT imaging on 10/11/2022 which revealed a tiny right renal calculus with no evidence of ureteric calculi, hydronephrosis or acute findings.  She represented to the emergency department on 2/29 and renal ultrasound revealed no hydronephrosis.   The patient today states that she has been having right-sided flank pain ever since 25 February.  She states that has been persistent.  She  states that the [place] she was prescribed has been ineffective. Remainder of history is somewhat limited by the patient's pain.   On arrival, the patient was afebrile, initially bradycardic, subsequently noted to be not bradycardic or tachycardic, heart rate 70, normotensive, saturating 100% on room air.  Patient writhing in pain complaining of right-sided flank pain on exam.  Abdomen difficult to examine given the patient's overall discomfort, does not appear peritonitic.  Patient with punctate nephrolithiasis were seen on previous CT imaging, symptoms consistent with likely flank pain/nephrolithiasis.  Considered pyelonephritis, considered ovarian torsion, tubo-ovarian abscess.  Patient not pregnant.  hCG negative.  Laboratory evaluation significant for hCG negative, CBC without a leukocytosis or anemia, lipase normal, CMP unremarkable.  Urinalysis ordered and pending.  CT Stone: IMPRESSION:  1. There is mild right hydronephrosis and trace perinephric  stranding, new in the interval. There is a 3 mm calcification in the  right pelvis which could represent a distal ureteral stone.  2. At least 3 punctate stones are identified in the right kidney.  3. No other abnormalities.    Urinalysis was negative for UTI.  Given the CT findings, fever likely ureteric lithiasis is the etiology of  the patient's presentation.  She is not septic, has no leukocytosis, no UTI, no AKI.  Her pain is well-controlled on repeat assessment and she is resting comfortably.  Overall recommended that the patient follow-up outpatient with urology, Percocet provided for pain control, Flomax provided as well.  Stable for discharge.   Final Clinical Impression(s) / ED Diagnoses Final diagnoses:  Ureterolithiasis    Rx / DC Orders ED Discharge Orders          Ordered    Ambulatory referral to Urology        10/17/22 0949    tamsulosin (FLOMAX) 0.4 MG CAPS capsule  Daily        10/17/22 1016    oxyCODONE-acetaminophen (PERCOCET/ROXICET) 5-325 MG tablet  Every 6 hours PRN        10/17/22 1016              Regan Lemming, MD 10/17/22 1023

## 2022-10-17 NOTE — ED Notes (Signed)
Pt screaming in pain, writhing in bed. Pain meds administered, pulse ox and heart rate being monitored, pt refusing cardiac monitoring at this time due to pain.

## 2022-10-17 NOTE — ED Notes (Signed)
Pt unable to void ,aware needs urine.

## 2022-10-17 NOTE — ED Notes (Signed)
Dc instructions reviewed with patient. Patient voiced understanding. Dc with belongings.  °

## 2022-10-17 NOTE — Discharge Instructions (Addendum)
Your CT scan was for a kidney stone on the right.  Recommend NSAIDs for pain control, Flomax, follow-up with urology for further management.

## 2022-10-19 ENCOUNTER — Telehealth: Payer: Self-pay | Admitting: Nurse Practitioner

## 2022-10-19 ENCOUNTER — Telehealth: Payer: Self-pay

## 2022-10-19 ENCOUNTER — Other Ambulatory Visit: Payer: Self-pay | Admitting: Urology

## 2022-10-19 NOTE — Telephone Encounter (Signed)
Patient called stating she has been to ED 4 times due to unable to pass kidney stones. She stated he has urology appointment 10/20/22 @ 8:45 with [facility], but requesting I call [facility] to see if they can see her today. Per their office, patient will need to call to s/w Triage to see if they can get her in today since she is not established patient. Relayed message to patient's mother and I faxed referral to their triage; [phone]

## 2022-10-19 NOTE — Telephone Encounter (Signed)
Incoming call from pt on triage line from patient who states she was told to call urology for follow up appointment. She states she would like to see Mariah Bird, advised pt that per office protocol she will need to see an MD as she will be a new patient to the practice (pt has not been seen since 2018). Pt voiced understanding, appointment scheduled.

## 2022-10-20 ENCOUNTER — Ambulatory Visit: Payer: 59 | Admitting: Nurse Practitioner

## 2022-10-20 ENCOUNTER — Encounter (HOSPITAL_BASED_OUTPATIENT_CLINIC_OR_DEPARTMENT_OTHER): Payer: Self-pay | Admitting: Urology

## 2022-10-20 ENCOUNTER — Ambulatory Visit: Payer: BLUE CROSS/BLUE SHIELD | Admitting: Urology

## 2022-10-20 NOTE — Progress Notes (Signed)
Spoke w/ via phone for pre-op interview--- pt Lab needs dos----  urine preg             Lab results------ current lab results in epic dated 10-17-2022 CBCdiff/ CMP COVID test -----patient states asymptomatic no test needed Arrive at ------- 0930 on 10-21-2022  NPO after MN NO Solid Food.  Clear liquids from MN until--- 0830 Med rec completed Medications to take morning of surgery ----- flomax, protonix, may take one type of pain med  if needed and nausea med Diabetic medication ----- n/a Patient instructed no nail polish to be worn day of surgery Patient instructed to bring photo id and insurance card day of surgery Patient aware to have Driver (ride ) / caregiver    for 24 hours after surgery -- mother, mia Patient Special Instructions ----- n/a Pre-Op special Istructions ----- n/a Patient verbalized understanding of instructions that were given at this phone interview. Patient denies shortness of breath, chest pain, fever, cough at this phone interview.

## 2022-10-21 ENCOUNTER — Other Ambulatory Visit: Payer: Self-pay

## 2022-10-21 ENCOUNTER — Ambulatory Visit (HOSPITAL_BASED_OUTPATIENT_CLINIC_OR_DEPARTMENT_OTHER): Payer: 59 | Admitting: Certified Registered Nurse Anesthetist

## 2022-10-21 ENCOUNTER — Ambulatory Visit (HOSPITAL_BASED_OUTPATIENT_CLINIC_OR_DEPARTMENT_OTHER)
Admission: RE | Admit: 2022-10-21 | Discharge: 2022-10-21 | Disposition: A | Discharge status: Home / Self Care | Payer: 59 | Attending: Urology | Admitting: Urology

## 2022-10-21 ENCOUNTER — Encounter (HOSPITAL_BASED_OUTPATIENT_CLINIC_OR_DEPARTMENT_OTHER): Payer: Self-pay | Admitting: Urology

## 2022-10-21 ENCOUNTER — Encounter (HOSPITAL_BASED_OUTPATIENT_CLINIC_OR_DEPARTMENT_OTHER)
Admission: RE | Disposition: A | Discharge status: Home / Self Care | Payer: Self-pay | Source: Home / Self Care | Attending: Urology

## 2022-10-21 DIAGNOSIS — N201 Calculus of ureter: Secondary | ICD-10-CM

## 2022-10-21 DIAGNOSIS — Z01818 Encounter for other preprocedural examination: Secondary | ICD-10-CM

## 2022-10-21 DIAGNOSIS — N200 Calculus of kidney: Secondary | ICD-10-CM | POA: Insufficient documentation

## 2022-10-21 DIAGNOSIS — Z87442 Personal history of urinary calculi: Secondary | ICD-10-CM | POA: Insufficient documentation

## 2022-10-21 HISTORY — DX: Family history of other specified conditions: Z84.89

## 2022-10-21 HISTORY — DX: Cannabis use, unspecified, uncomplicated: R11.2

## 2022-10-21 HISTORY — DX: Presence of spectacles and contact lenses: Z97.3

## 2022-10-21 HISTORY — DX: Hypothyroidism, unspecified: E03.9

## 2022-10-21 HISTORY — DX: Unspecified symptoms and signs involving the genitourinary system: R39.9

## 2022-10-21 HISTORY — DX: Gastro-esophageal reflux disease without esophagitis: K21.9

## 2022-10-21 HISTORY — PX: CYSTOSCOPY/URETEROSCOPY/HOLMIUM LASER/STENT PLACEMENT: SHX6546

## 2022-10-21 HISTORY — DX: Calculus of ureter: N20.1

## 2022-10-21 HISTORY — DX: Personal history of urinary calculi: Z87.442

## 2022-10-21 LAB — POCT PREGNANCY, URINE: Preg Test, Ur: NEGATIVE

## 2022-10-21 SURGERY — CYSTOSCOPY/URETEROSCOPY/HOLMIUM LASER/STENT PLACEMENT
Anesthesia: General | Laterality: Right

## 2022-10-21 MED ORDER — CEFAZOLIN SODIUM-DEXTROSE 2-4 GM/100ML-% IV SOLN
2.0000 g | INTRAVENOUS | Status: AC
  Administered 2022-10-21: 2 g via INTRAVENOUS

## 2022-10-21 MED ORDER — PROPOFOL 10 MG/ML IV BOLUS
INTRAVENOUS | Status: AC
  Filled 2022-10-21: qty 20

## 2022-10-21 MED ORDER — MIDAZOLAM HCL 2 MG/2ML IJ SOLN
INTRAMUSCULAR | Status: AC
  Filled 2022-10-21: qty 2

## 2022-10-21 MED ORDER — ONDANSETRON HCL 4 MG/2ML IJ SOLN
INTRAMUSCULAR | Status: DC | PRN
  Administered 2022-10-21: 4 mg via INTRAVENOUS

## 2022-10-21 MED ORDER — PHENAZOPYRIDINE HCL 200 MG PO TABS: 200.0000 mg | ORAL_TABLET | Freq: Three times a day (TID) | ORAL | 0 refills | Status: AC | PRN

## 2022-10-21 MED ORDER — ONDANSETRON HCL 4 MG/2ML IJ SOLN
INTRAMUSCULAR | Status: AC
  Filled 2022-10-21: qty 2

## 2022-10-21 MED ORDER — SODIUM CHLORIDE 0.9 % IR SOLN
Status: DC | PRN
  Administered 2022-10-21: 3000 mL

## 2022-10-21 MED ORDER — FENTANYL CITRATE (PF) 100 MCG/2ML IJ SOLN
INTRAMUSCULAR | Status: AC
  Filled 2022-10-21: qty 2

## 2022-10-21 MED ORDER — CEFAZOLIN SODIUM-DEXTROSE 2-4 GM/100ML-% IV SOLN
INTRAVENOUS | Status: AC
  Filled 2022-10-21: qty 100

## 2022-10-21 MED ORDER — OXYCODONE HCL 5 MG PO TABS
5.0000 mg | ORAL_TABLET | Freq: Once | ORAL | Status: AC
  Administered 2022-10-21: 5 mg via ORAL

## 2022-10-21 MED ORDER — FENTANYL CITRATE (PF) 100 MCG/2ML IJ SOLN
25.0000 ug | INTRAMUSCULAR | Status: DC | PRN
  Administered 2022-10-21: 50 ug via INTRAVENOUS
  Administered 2022-10-21 (×2): 25 ug via INTRAVENOUS

## 2022-10-21 MED ORDER — OXYCODONE HCL 5 MG PO TABS
ORAL_TABLET | ORAL | Status: AC
  Filled 2022-10-21: qty 1

## 2022-10-21 MED ORDER — AMISULPRIDE (ANTIEMETIC) 5 MG/2ML IV SOLN
INTRAVENOUS | Status: AC
  Filled 2022-10-21: qty 4

## 2022-10-21 MED ORDER — MIDAZOLAM HCL 5 MG/5ML IJ SOLN
INTRAMUSCULAR | Status: DC | PRN
  Administered 2022-10-21: 2 mg via INTRAVENOUS

## 2022-10-21 MED ORDER — CEPHALEXIN 500 MG PO CAPS: 500.0000 mg | ORAL_CAPSULE | Freq: Two times a day (BID) | ORAL | 0 refills | Status: AC

## 2022-10-21 MED ORDER — ACETAMINOPHEN 10 MG/ML IV SOLN: 1000.0000 mg | Freq: Once | INTRAVENOUS | Status: DC | PRN

## 2022-10-21 MED ORDER — PROPOFOL 10 MG/ML IV BOLUS
INTRAVENOUS | Status: DC | PRN
  Administered 2022-10-21: 100 mg via INTRAVENOUS
  Administered 2022-10-21: 50 mg via INTRAVENOUS

## 2022-10-21 MED ORDER — OXYBUTYNIN CHLORIDE 5 MG PO TABS: 5.0000 mg | ORAL_TABLET | Freq: Three times a day (TID) | ORAL | 1 refills | Status: DC | PRN

## 2022-10-21 MED ORDER — 0.9 % SODIUM CHLORIDE (POUR BTL) OPTIME
TOPICAL | Status: DC | PRN
  Administered 2022-10-21: 500 mL

## 2022-10-21 MED ORDER — LIDOCAINE 2% (20 MG/ML) 5 ML SYRINGE
INTRAMUSCULAR | Status: DC | PRN
  Administered 2022-10-21: 40 mg via INTRAVENOUS

## 2022-10-21 MED ORDER — IOHEXOL 300 MG/ML  SOLN
INTRAMUSCULAR | Status: DC | PRN
  Administered 2022-10-21: 7 mL via URETHRAL

## 2022-10-21 MED ORDER — LACTATED RINGERS IV SOLN: INTRAVENOUS | Status: DC

## 2022-10-21 MED ORDER — LIDOCAINE HCL (PF) 2 % IJ SOLN
INTRAMUSCULAR | Status: AC
  Filled 2022-10-21: qty 5

## 2022-10-21 MED ORDER — PHENYLEPHRINE 80 MCG/ML (10ML) SYRINGE FOR IV PUSH (FOR BLOOD PRESSURE SUPPORT)
PREFILLED_SYRINGE | INTRAVENOUS | Status: DC | PRN
  Administered 2022-10-21: 80 ug via INTRAVENOUS
  Administered 2022-10-21: 160 ug via INTRAVENOUS
  Administered 2022-10-21: 240 ug via INTRAVENOUS

## 2022-10-21 MED ORDER — DEXAMETHASONE SODIUM PHOSPHATE 10 MG/ML IJ SOLN
INTRAMUSCULAR | Status: DC | PRN
  Administered 2022-10-21: 5 mg via INTRAVENOUS

## 2022-10-21 MED ORDER — FENTANYL CITRATE (PF) 100 MCG/2ML IJ SOLN
INTRAMUSCULAR | Status: DC | PRN
  Administered 2022-10-21: 50 ug via INTRAVENOUS

## 2022-10-21 MED ORDER — AMISULPRIDE (ANTIEMETIC) 5 MG/2ML IV SOLN
10.0000 mg | Freq: Once | INTRAVENOUS | Status: AC
  Administered 2022-10-21: 10 mg via INTRAVENOUS

## 2022-10-21 SURGICAL SUPPLY — 23 items
BAG DRAIN URO-CYSTO SKYTR STRL (DRAIN) ×1 IMPLANT
BAG DRN UROCATH (DRAIN) ×1
BASKET ZERO TIP NITINOL 2.4FR (BASKET) ×1 IMPLANT
BSKT STON RTRVL ZERO TP 2.4FR (BASKET) ×1
CATH URETL OPEN 5X70 (CATHETERS) IMPLANT
CLOTH BEACON ORANGE TIMEOUT ST (SAFETY) ×1 IMPLANT
GLOVE BIO SURGEON STRL SZ7 (GLOVE) IMPLANT
GLOVE BIO SURGEON STRL SZ7.5 (GLOVE) ×1 IMPLANT
GLOVE SURG SS PI 6.5 STRL IVOR (GLOVE) IMPLANT
GOWN STRL REUS W/ TWL LRG LVL3 (GOWN DISPOSABLE) IMPLANT
GOWN STRL REUS W/TWL LRG LVL3 (GOWN DISPOSABLE) ×1 IMPLANT
GOWN STRL REUS W/TWL XL LVL3 (GOWN DISPOSABLE) ×1 IMPLANT
GUIDEWIRE ZIPWRE .038 STRAIGHT (WIRE) ×1 IMPLANT
IV NS IRRIG 3000ML ARTHROMATIC (IV SOLUTION) ×2 IMPLANT
KIT TURNOVER CYSTO (KITS) ×1 IMPLANT
MANIFOLD NEPTUNE II (INSTRUMENTS) ×1 IMPLANT
NS IRRIG 500ML POUR BTL (IV SOLUTION) ×1 IMPLANT
PACK CYSTO (CUSTOM PROCEDURE TRAY) ×1 IMPLANT
SLEEVE SCD COMPRESS KNEE MED (STOCKING) ×1 IMPLANT
STENT URET 6FRX24 CONTOUR (STENTS) IMPLANT
SYR 10ML LL (SYRINGE) ×1 IMPLANT
TUBE CONNECTING 12X1/4 (SUCTIONS) IMPLANT
TUBING UROLOGY SET (TUBING) ×1 IMPLANT

## 2022-10-21 NOTE — Anesthesia Preprocedure Evaluation (Signed)
Anesthesia Evaluation  Patient identified by MRN, date of birth, ID band Patient awake    Reviewed: Allergy & Precautions, NPO status , Patient's Chart, lab work & pertinent test results  Airway Mallampati: II  TM Distance: >3 FB Neck ROM: Full    Dental no notable dental hx.    Pulmonary neg pulmonary ROS   Pulmonary exam normal        Cardiovascular negative cardio ROS  Rhythm:Regular Rate:Normal     Neuro/Psych   Anxiety Depression    negative neurological ROS     GI/Hepatic Neg liver ROS,GERD  ,,  Endo/Other  Hypothyroidism    Renal/GU Right ureteral calculi   negative genitourinary   Musculoskeletal negative musculoskeletal ROS (+)    Abdominal Normal abdominal exam  (+)   Peds  Hematology negative hematology ROS (+)   Anesthesia Other Findings   Reproductive/Obstetrics                             Anesthesia Physical Anesthesia Plan  ASA: 2  Anesthesia Plan: General   Post-op Pain Management:    Induction: Intravenous  PONV Risk Score and Plan: 3 and Ondansetron, Dexamethasone, Midazolam and Treatment may vary due to age or medical condition  Airway Management Planned: Mask and LMA  Additional Equipment: None  Intra-op Plan:   Post-operative Plan: Extubation in OR  Informed Consent: I have reviewed the patients History and Physical, chart, labs and discussed the procedure including the risks, benefits and alternatives for the proposed anesthesia with the patient or authorized representative who has indicated his/her understanding and acceptance.     Dental advisory given  Plan Discussed with:   Anesthesia Plan Comments:        Anesthesia Quick Evaluation

## 2022-10-21 NOTE — Anesthesia Procedure Notes (Addendum)
Procedure Name: LMA Insertion Date/Time: 10/21/2022 11:19 AM  Performed by: Justice Rocher, CRNAPre-anesthesia Checklist: Patient identified, Emergency Drugs available, Suction available, Patient being monitored and Timeout performed Patient Re-evaluated:Patient Re-evaluated prior to induction Oxygen Delivery Method: Circle system utilized Preoxygenation: Pre-oxygenation with 100% oxygen Induction Type: IV induction Laryngoscope Size: Mac and 3 Placement Confirmation: breath sounds checked- equal and bilateral, positive ETCO2 and CO2 detector

## 2022-10-21 NOTE — H&P (Signed)
Office Visit Report     10/19/2022   --------------------------------------------------------------------------------   Mariah Bird  MRN: [id number]  DOB: 1986-05-17, 37 year old Female  SSN:   PRIMARY CARE:   REFERRING:    PROVIDER:  Ellison Hughs, M.D.  LOCATION:  [facility] [phone]     --------------------------------------------------------------------------------   CC: I have pain in the flank.  HPI: Mariah Bird is a 37 year-old female patient who is here for flank pain.  The problem is on the right side. Her pain started about approximately 10/09/2022. The pain is sharp. The pain is intermittent. The pain does radiate.   None< makes the pain better. Nothing causes the pain to become worse. She was treated with the following pain medication(s): Hydrocodone, Toradol, and Percocet.   She has had this same pain previously. She has had kidney stones.   -3 mm right distal ureteral stone noted on CTSS from 10/17/22  -BMP, CBC and UA from 3/2 were unremarkable.  -Seen multiple times in the ED due to right renal colic  -She denies fever, but reports chills when the pain is intense. Denies dysuria     ALLERGIES: No Allergies    MEDICATIONS: Doxycycline Hyclate 100 mg capsule  Carafate  Celexa 20 mg tablet  Dronabinol 5 mg capsule  Hydrocortisone 0.5 % cream  Imodium A-D 2 mg tablet  Kenolog Ointment  Lidocaine 5 % ointment  Lomotil 2.5 mg-0.025 mg tablet  Megestrol Acetate 40 mg tablet  Ondansetron Odt 4 mg tablet,disintegrating  Oxycodone-Acetaminophen 5 mg-325 mg tablet  Pantoprazole Sodium 40 mg tablet, delayed release  Promethazine Hcl 12.5 mg tablet  Remeron 15 mg tablet  Vitamin D2 1,250 mcg (50,000 unit) capsule     GU PSH: No GU PSH      PSH Notes: nose surgery   NON-GU PSH: No Non-GU PSH    GU PMH: None   NON-GU PMH: Anxiety Depression GERD Hyperthyroidism    FAMILY HISTORY: Kidney Stones - Runs in Family    SOCIAL HISTORY: Marital Status: Single Preferred Language: English; Ethnicity: Not Hispanic Or Latino; Race: Black or African American Current Smoking Status: Patient has never smoked.   Tobacco Use Assessment Completed: Used Tobacco in last 30 days? Has never drank.  Drinks 1 caffeinated drink per day.    REVIEW OF SYSTEMS:    GU Review Female:   Patient reports frequent urination, get up at night to urinate, leakage of urine, stream starts and stops, trouble starting your stream, and have to strain to urinate. Patient denies hard to postpone urination, burning /pain with urination, and being pregnant.  Gastrointestinal (Upper):   Patient denies nausea, vomiting, and indigestion/ heartburn.  Gastrointestinal (Lower):   Patient denies diarrhea and constipation.  Constitutional:   Patient denies fever, night sweats, weight loss, and fatigue.  Skin:   Patient denies itching and skin rash/ lesion.  Eyes:   Patient denies blurred vision and double vision.  Ears/ Nose/ Throat:   Patient denies sore throat and sinus problems.  Hematologic/Lymphatic:   Patient denies swollen glands and easy bruising.  Cardiovascular:   Patient denies leg swelling and chest pains.  Respiratory:   Patient denies cough and shortness of breath.  Endocrine:   Patient denies excessive thirst.  Musculoskeletal:   Patient denies back pain and joint pain.  Neurological:   Patient denies headaches and dizziness.  Psychologic:   Patient denies depression and anxiety.   VITAL SIGNS:      10/19/2022 11:23 AM  10/19/2022 11:15 AM  Weight   89 lb / 40.37 kg  Height   61 in / 154.94 cm  BP   99/66 mmHg  Pulse   80 /min  Temperature 98.0 F / 36.6 C    BMI 16.8 kg/m   Complexity of Data:  Lab Test Review:   BMP, CBC with Diff  Records Review:   Previous Hospital Records  Urine Test Review:   Urinalysis  X-Ray Review: C.T. Abdomen/Pelvis: Reviewed Films. Reviewed Report. Discussed With Patient.    Notes:                      CLINICAL DATA: Right-sided flank pain.   EXAM:  CT ABDOMEN AND PELVIS WITHOUT CONTRAST   TECHNIQUE:  Multidetector CT imaging of the abdomen and pelvis was performed  following the standard protocol without IV contrast.   RADIATION DOSE REDUCTION: This exam was performed according to the  departmental dose-optimization program which includes automated  exposure control, adjustment of the mA and/or kV according to  patient size and/or use of iterative reconstruction technique.   COMPARISON: CT scans of the abdomen March 31, 2016, April 13, 2022, and October 11, 2022.   FINDINGS:  Lower chest: No acute abnormality.   Hepatobiliary: No focal liver abnormality is seen. No gallstones,  gallbladder wall thickening, or biliary dilatation.   Pancreas: Unremarkable. No pancreatic ductal dilatation or  surrounding inflammatory changes.   Spleen: Normal in size without focal abnormality.   Adrenals/Urinary Tract: Adrenal glands are normal.   The left kidney is normal with no stones, masses, hydronephrosis, or  perinephric stranding. No left ureteral abnormalities are  identified.   At least 3 punctate stones are identified in the right kidney. There  is mild right hydronephrosis not identified previously. There is  trace perinephric stranding appreciated on coronal imaging. There is  a calcification in the right pelvis measuring 3 mm on series 2,  image 59 and coronal image 33. This calcification is stable since  October 10, 2022. The bladder is unremarkable.   Stomach/Bowel: The stomach and small bowel are normal. The colon is  normal. The appendix is normal.   Vascular/Lymphatic: No significant vascular findings are present. No  enlarged abdominal or pelvic lymph nodes.   Reproductive: Uterus and bilateral adnexa are unremarkable.   Other: No abdominal wall hernia or abnormality. No abdominopelvic  ascites.   Musculoskeletal: No acute or significant osseous  findings.   IMPRESSION:  1. There is mild right hydronephrosis and trace perinephric  stranding, new in the interval. There is a 3 mm calcification in the  right pelvis which could represent a distal ureteral stone.  2. At least 3 punctate stones are identified in the right kidney.  3. No other abnormalities.    Electronically Signed  By: Dorise Bullion III M.D.  On: 10/17/2022 10:01   PROCEDURES:          Urinalysis w/Scope - 81001 Dipstick Dipstick Cont'd Micro  Color: Yellow Bilirubin: Neg WBC/hpf: 0 - 5/hpf  Appearance: Slightly Cloudy Ketones: 3+ RBC/hpf: NS (Not Seen)  Specific Gravity: 1.015 Blood: Neg Bacteria: Mod (26-50/hpf)  pH: 6.0 Protein: Neg Cystals: NS (Not Seen)  Glucose: Neg Urobilinogen: 0.2 Casts: Granular    Nitrites: Neg Trichomonas: Not Present    Leukocyte Esterase: Neg Mucous: Present      Epithelial Cells: 0 - 5/hpf      Yeast: NS (Not Seen)      Sperm: Not Present  Notes:            Ketoralac '60mg'$  - [id number], [id number] 60 mg was given and zero was wasted   Qty: 60 Adm. By: Pricilla Riffle  Unit: mg Lot No [id number]  Route: IM Exp. Date 11/09/2022  Freq: None Mfgr.:   Site: Left Hip         Morphine '4mg'$  - 96372, J2270 4 mg was given and zero was wasted.   Qty: 4 Adm. By: Pricilla Riffle  Unit: mg Lot No [id number]  Route: IM Exp. Date 03/17/2024  Freq: None Mfgr.:   Site: Right Buttock   ASSESSMENT:      ICD-10 Details  1 GU:   Ureteral calculus - N20.1 Right, Undiagnosed New Problem  2   Flank Pain - R10.84 Right, Undiagnosed New Problem     PLAN:            Medications New Meds: Ketorolac Tromethamine 10 mg tablet 1 tablet PO Q 6 H PRN   #20  0 Refill(s)  Pharmacy Name:  [id number]  Address:  [address]   [place], [place] [id number]  Phone:  [phone]  Fax:  [phone]            Orders Labs Urine Culture          Schedule Return Visit/Planned Activity: Next Available Appointment - Schedule  Surgery          Document Letter(s):  Created for Patient: Clinical Summary         Notes:   The risks, benefits and alternatives of cystoscopy with RIGHT ureteroscopy, laser lithotripsy and ureteral stent placement was discussed the patient. Risks included, but are not limited to: bleeding, urinary tract infection, ureteral injury/avulsion, ureteral stricture formation, retained stone fragments, the possibility that multiple surgeries may be required to treat the stone(s), MI, stroke, PE and the inherent risks of general anesthesia. The patient voices understanding and wishes to proceed.

## 2022-10-21 NOTE — Anesthesia Postprocedure Evaluation (Signed)
Anesthesia Post Note  Patient: Mariah Bird  Procedure(s) Performed: CYSTOSCOPY RIGHT URETEROSCOPY, RETROGRADE PYELOGRAM, STONE BASKETTING, AND RIGHT URTERAL STENT PLACEMENT (Right)     Patient location during evaluation: PACU Anesthesia Type: General Level of consciousness: awake and alert Pain management: pain level controlled Vital Signs Assessment: post-procedure vital signs reviewed and stable Respiratory status: spontaneous breathing, nonlabored ventilation, respiratory function stable and patient connected to nasal cannula oxygen Cardiovascular status: blood pressure returned to baseline and stable Postop Assessment: no apparent nausea or vomiting Anesthetic complications: no   No notable events documented.  Last Vitals:  Vitals:   10/21/22 1300 10/21/22 1335  BP: 131/72 121/89  Pulse: 69 78  Resp: 12 14  Temp:  36.8 C  SpO2: 100% 99%    Last Pain:  Vitals:   10/21/22 1215  TempSrc:   PainSc: 0-No pain                 Belenda Cruise P Khiley Lieser

## 2022-10-21 NOTE — Transfer of Care (Signed)
Immediate Anesthesia Transfer of Care Note  Patient: Mariah Bird  Procedure(s) Performed: CYSTOSCOPY RIGHT URETEROSCOPY, RETROGRADE PYELOGRAM, STONE BASKETTING, AND RIGHT URTERAL STENT PLACEMENT (Right)  Patient Location: PACU  Anesthesia Type:General  Level of Consciousness: awake, alert , oriented, and patient cooperative  Airway & Oxygen Therapy: Patient Spontanous Breathing  Post-op Assessment: Report given to RN and Post -op Vital signs reviewed and stable  Post vital signs: Reviewed and stable  Last Vitals:  Vitals Value Taken Time  BP 129/90 10/21/22 1201  Temp    Pulse 86 10/21/22 1203  Resp 13 10/21/22 1203  SpO2 100 % 10/21/22 1203  Vitals shown include unvalidated device data.  Last Pain:  Vitals:   10/21/22 1005  TempSrc: Oral  PainSc: 6       Patients Stated Pain Goal: 3 (0000000 [id number])  Complications: No notable events documented.

## 2022-10-21 NOTE — Discharge Instructions (Addendum)
[facility] [phone] Post Ureteroscopy With or Without Stent Instructions  Definitions:  Ureter: The duct that transports urine from the kidney to the bladder. Stent:   A plastic hollow tube that is placed into the ureter, from the kidney to the bladder to prevent the ureter from swelling shut.  GENERAL INSTRUCTIONS:  Despite the fact that no skin incisions were used, the area around the ureter and bladder is raw and irritated. The stent is a foreign body which will further irritate the bladder wall. This irritation is manifested by increased frequency of urination, both day and night, and by an increase in the urge to urinate. In some, the urge to urinate is present almost always. Sometimes the urge is strong enough that you may not be able to stop yourself from urinating. The only real cure is to remove the stent and then give time for the bladder wall to heal which can't be done until the danger of the ureter swelling shut has passed, which varies.  You may see some blood in your urine while the stent is in place and a few days afterwards. Do not be alarmed, even if the urine was clear for a while. Get off your feet and drink lots of fluids until clearing occurs. If you start to pass clots or don't improve, call us.  DIET: You may return to your normal diet immediately. Because of the raw surface of your bladder, alcohol, spicy foods, acid type foods and drinks with caffeine may cause irritation or frequency and should be used in moderation. To keep your urine flowing freely and to avoid constipation, drink plenty of fluids during the day ( 8-10 glasses ). Tip: Avoid cranberry juice because it is very acidic.  ACTIVITY: Your physical activity doesn't need to be restricted. However, if you are very active, you may see some blood in your urine. We suggest that you reduce your activity under these circumstances until the bleeding has stopped.  BOWELS: It is important to  keep your bowels regular during the postoperative period. Straining with bowel movements can cause bleeding. A bowel movement every other day is reasonable. Use a mild laxative if needed, such as Milk of Magnesia 2-3 tablespoons, or 2 Dulcolax tablets. Call if you continue to have problems. If you have been taking narcotics for pain, before, during or after your surgery, you may be constipated. Take a laxative if necessary.   MEDICATION: You should resume your pre-surgery medications unless told not to. In addition you will often be given an antibiotic to prevent infection. These should be taken as prescribed until the bottles are finished unless you are having an unusual reaction to one of the drugs.  PROBLEMS YOU SHOULD REPORT TO [place]: Fevers over 100.5 Fahrenheit. Heavy bleeding, or clots ( See above notes about blood in urine ). Inability to urinate. Drug reactions ( hives, rash, nausea, vomiting, diarrhea ). Severe burning or pain with urination that is not improving.       Post Anesthesia Home Care Instructions  Activity: Get plenty of rest for the remainder of the day. A responsible individual must stay with you for 24 hours following the procedure.  For the next 24 hours, DO NOT: -Drive a car -Paediatric nurse -Drink alcoholic beverages -Take any medication unless instructed by your physician -Make any legal decisions or sign important papers.  Meals: Start with liquid foods such as gelatin or soup. Progress to regular foods as tolerated. Avoid greasy, spicy, heavy foods. If nausea  and/or vomiting occur, drink only clear liquids until the nausea and/or vomiting subsides. Call your physician if vomiting continues.  Special Instructions/Symptoms: Your throat may feel dry or sore from the anesthesia or the breathing tube placed in your throat during surgery. If this causes discomfort, gargle with warm salt water. The discomfort should disappear within 24 hours.

## 2022-10-21 NOTE — Op Note (Signed)
Operative Note  Preoperative diagnosis:  1.  3 mm right distal ureteral stone  Postoperative diagnosis: 1.  3 mm right distal ureteral stone 2.  Stenotic right distal ureter  Procedure(s): 1.  Cystoscopy with right ureteroscopy, basket stone extraction and right ureteral stent placement 2.  Right retrograde pyelogram with intraoperative interpretation fluoroscopic imaging  Surgeon: Ellison Hughs, MD  Assistants:  None  Anesthesia:  General  Complications:  None  EBL: Less than 5 mL  Specimens: 1.  Right ureteral stone  Drains/Catheters: 1.  Right 6 French, 24 cm JJ stent without tether  Intraoperative findings:   Obstructing and impacted 3 mm right distal ureteral stone Solitary right collecting system with no filling defects or dilation involving the right ureter or right renal pelvis seen on retrograde pyelogram   Indication:  Mariah Bird is a 37 y.o. female with a history of a 3 mm right distal ureteral stone associated with right renal colic.  She has been consented for the above procedures, voices understanding wish to proceed.  Description of procedure:  After informed consent was obtained, the patient was brought to the operating room and general LMA anesthesia was administered. The patient was then placed in the dorsolithotomy position and prepped and draped in the usual sterile fashion. A timeout was performed. A 23 French rigid cystoscope was then inserted into the urethral meatus and advanced into the bladder under direct vision. A complete bladder survey revealed no intravesical pathology.  I attempted to place a Glidewire up the right ureter, but met resistance in the distal aspects of the right ureter.  After extensive wire manipulation, was initially able to bypass the area of obstruction within the lumen of the ureter from her stone.  The semirigid ureteroscope was then advanced into the distal aspects of the right ureter where it was found to be  stenotic and her stone was found to be impacted.  A 0 tip basket was then used to extract the stone.  A 5 French ureteral catheter was then advanced over the Glidewire and into the proximal aspects of the right ureter.  A right retrograde pyelogram was obtained, with findings listed above.  The Glidewire was left in place and the ureteral catheter was removed.  A 6 French, 24 cm JJ stent was then advanced over the wire into position within the right collecting system, confirmed via fluoroscopy.  The patient's bladder was drained.  She tolerated procedure well and was transferred to the postoperative unit in stable condition.  Plan: Follow-up in 1 week for office cystoscopy and stent

## 2022-10-22 ENCOUNTER — Encounter (HOSPITAL_BASED_OUTPATIENT_CLINIC_OR_DEPARTMENT_OTHER): Payer: Self-pay | Admitting: Urology

## 2022-10-28 LAB — THYROGLOBULIN ANTIBODY: Thyroglobulin Antibody: <1 IU/mL (ref 0.0–0.9)

## 2022-10-28 LAB — TSH+FREE T4
Free T4: 1.49 ng/dL (ref 0.82–1.77)
TSH: 1.8 u[IU]/mL (ref 0.450–4.500)

## 2022-10-28 LAB — THYROID PEROXIDASE ANTIBODY: Thyroperoxidase Ab SerPl-aCnc: 40 IU/mL — ABNORMAL HIGH (ref 0–34)

## 2022-10-28 LAB — CORTISOL-AM, BLOOD: Cortisol - AM: 13.2 ug/dL (ref 6.2–19.4)

## 2022-10-29 ENCOUNTER — Encounter: Payer: Self-pay | Admitting: Nurse Practitioner

## 2022-10-29 DIAGNOSIS — R946 Abnormal results of thyroid function studies: Secondary | ICD-10-CM

## 2022-11-03 ENCOUNTER — Telehealth: Payer: Self-pay | Admitting: Nurse Practitioner

## 2022-11-03 NOTE — Telephone Encounter (Signed)
Endocrinology referral sent via [employer] to Dr. Madelin Rear with  [facility] per patient's request-Toni

## 2022-11-03 NOTE — Telephone Encounter (Signed)
Endocrinology appointment>> 11/23/22 with Ellis

## 2022-11-12 ENCOUNTER — Telehealth: Payer: Self-pay | Admitting: Nurse Practitioner

## 2022-11-12 NOTE — Telephone Encounter (Signed)
Patient called stating [facility] told her they could not see her due to all her issues. She will try to find somewhere else to go, then call me so I can send out referral-Toni

## 2022-11-26 ENCOUNTER — Encounter: Payer: 59 | Admitting: Nurse Practitioner

## 2022-12-07 ENCOUNTER — Ambulatory Visit: Payer: 59 | Admitting: Nurse Practitioner

## 2022-12-21 ENCOUNTER — Ambulatory Visit: Payer: 59 | Admitting: Nurse Practitioner

## 2022-12-21 ENCOUNTER — Encounter: Payer: Self-pay | Admitting: Nurse Practitioner

## 2022-12-21 VITALS — BP 110/70 | HR 102 | Temp 98.1°F | Resp 16 | Ht 61.0 in | Wt 88.8 lb

## 2022-12-21 DIAGNOSIS — G4700 Insomnia, unspecified: Secondary | ICD-10-CM

## 2022-12-21 DIAGNOSIS — F411 Generalized anxiety disorder: Secondary | ICD-10-CM | POA: Diagnosis not present

## 2022-12-21 DIAGNOSIS — R630 Anorexia: Secondary | ICD-10-CM | POA: Diagnosis not present

## 2022-12-21 DIAGNOSIS — R634 Abnormal weight loss: Secondary | ICD-10-CM | POA: Diagnosis not present

## 2022-12-21 MED ORDER — MIRTAZAPINE 15 MG PO TABS: 15.0000 mg | ORAL_TABLET | Freq: Every day | ORAL | 2 refills | Status: DC

## 2022-12-21 MED ORDER — MEGESTROL ACETATE 40 MG PO TABS: 80.0000 mg | ORAL_TABLET | Freq: Two times a day (BID) | ORAL | 5 refills | Status: DC

## 2022-12-21 NOTE — Progress Notes (Signed)
[facility] [address] [place], [place] [address]  Internal MEDICINE  Office Visit Note  Patient Name: Mariah Bird  [id number]  [id number]  Date of Service: 12/21/2022  Chief Complaint  Patient presents with   Depression   Gastroesophageal Reflux   Follow-up    HPI Keydy presents for a follow-up visit for unexplained weight loss.  --Seen by registered dietician, On wait list for GI, appt is in July for now with Dr. [name] Has a stressful job, planning on a job change.  Mirtazapine working well at least maintaining her weight --wants to Stop dronabinol --want to Continue megestrol --has been starting to drink nutritional protein shakes, 1-2 per day.    Current Medication: Outpatient Encounter Medications as of 12/21/2022  Medication Sig   diphenoxylate-atropine (LOMOTIL) 2.5-0.025 MG tablet Take 1 tablet by mouth 4 (four) times daily as needed for diarrhea or loose stools.   ketorolac (TORADOL) 10 MG tablet Take 10 mg by mouth every 6 (six) hours as needed.   lidocaine (XYLOCAINE) 5 % ointment Apply 1 Application topically as needed.   loperamide (IMODIUM) 2 MG capsule Take 1 capsule (2 mg total) by mouth 4 (four) times daily as needed for diarrhea or loose stools.   mupirocin ointment (BACTROBAN) 2 % Apply 1 Application topically daily. (Patient taking differently: Apply 1 Application topically daily. Buttock s/p biopsy)   ondansetron (ZOFRAN-ODT) 4 MG disintegrating tablet Take 1 tablet (4 mg total) by mouth every 8 (eight) hours as needed for nausea or vomiting.   oxybutynin (DITROPAN) 5 MG tablet Take 1 tablet (5 mg total) by mouth every 8 (eight) hours as needed for bladder spasms.   oxyCODONE-acetaminophen (PERCOCET/ROXICET) 5-325 MG tablet Take 1 tablet by mouth every 6 (six) hours as needed for severe pain.   phenazopyridine (PYRIDIUM) 200 MG tablet Take 1 tablet (200 mg total) by mouth 3 (three) times daily as needed (for pain with urination).    promethazine (PHENERGAN) 12.5 MG tablet Take 1 tablet (12.5 mg total) by mouth every 6 (six) hours as needed for nausea or vomiting.   sucralfate (CARAFATE) 1 g tablet Take 1 g by mouth 3 (three) times daily as needed.   triamcinolone ointment (KENALOG) 0.1 % Apply twice daily to affected areas as needed for rash/itching. Avoid applying to face, groin, and axilla. (Patient taking differently: Apply topically as needed. Apply twice daily to affected areas as needed for rash/itching. Avoid applying to face, groin, and axilla.)   Vitamin D, Ergocalciferol, (DRISDOL) 1.25 MG (50000 UNIT) CAPS capsule Take 1 capsule (50,000 Units total) by mouth every 7 (seven) days. (Patient taking differently: Take 50,000 Units by mouth every 7 (seven) days. monday's)   [DISCONTINUED] dronabinol (MARINOL) 5 MG capsule Take 1 capsule (5 mg total) by mouth 2 (two) times daily before lunch and supper. Take 1 hour before each meal (except breakfast) (Patient taking differently: Take 5 mg by mouth as needed. Take 1 hour before each meal (except breakfast))   [DISCONTINUED] megestrol (MEGACE) 40 MG tablet Take 2 tablets (80 mg total) by mouth 2 (two) times daily.   [DISCONTINUED] mirtazapine (REMERON) 15 MG tablet Take 1 tablet (15 mg total) by mouth at bedtime.   megestrol (MEGACE) 40 MG tablet Take 2 tablets (80 mg total) by mouth 2 (two) times daily.   mirtazapine (REMERON) 15 MG tablet Take 1 tablet (15 mg total) by mouth at bedtime.   pantoprazole (PROTONIX) 40 MG tablet Take 1 tablet (40 mg total) by mouth  daily.   No facility-administered encounter medications on file as of 12/21/2022.    Surgical History: Past Surgical History:  Procedure Laterality Date   COLONOSCOPY WITH ESOPHAGOGASTRODUODENOSCOPY (EGD)  05/2022   CYSTOSCOPY/URETEROSCOPY/HOLMIUM LASER/STENT PLACEMENT Right 10/21/2022   Procedure: CYSTOSCOPY RIGHT URETEROSCOPY, RETROGRADE PYELOGRAM, STONE BASKETTING, AND RIGHT URTERAL STENT PLACEMENT;  Surgeon:  Rene Paci, MD;  Location: [facility];  Service: Urology;  Laterality: Right;   KNEE ARTHROSCOPY Right    age 37   NASAL SINUS SURGERY  12/18/2016    Medical History: Past Medical History:  Diagnosis Date   Anxiety    Depression    Family history of adverse reaction to anesthesia    mother--- ponv   GERD (gastroesophageal reflux disease)    History of gastritis 05/2022   History of kidney stones    Hypothyroidism    10-20-2022  per pt currently no meds ,followed by pcp   Lower urinary tract symptoms (LUTS)    Right ureteral calculus    Wears glasses     Family History: Family History  Problem Relation Age of Onset   Lupus Sister    Kidney disease Maternal Uncle    Kidney Stones Paternal Grandmother    Bladder Cancer Neg Hx    Kidney cancer Neg Hx    Prostate cancer Neg Hx     Social History   Socioeconomic History   Marital status: Single    Spouse name: Not on file   Number of children: Not on file   Years of education: Not on file   Highest education level: Not on file  Occupational History   Not on file  Tobacco Use   Smoking status: Never   Smokeless tobacco: Never  Vaping Use   Vaping Use: Never used  Substance and Sexual Activity   Alcohol use: Not Currently    Comment: 10-20-2022 stopped 2 wks ago   Drug use: Not Currently    Types: Marijuana    Comment: 10-20-2022 per pt last smoked marijuna 2 wks ago   Sexual activity: Not on file  Other Topics Concern   Not on file  Social History Narrative   Not on file   Social Determinants of Health   Financial Resource Strain: Not on file  Food Insecurity: Not on file  Transportation Needs: Not on file  Physical Activity: Not on file  Stress: Not on file  Social Connections: Not on file  Intimate Partner Violence: Not on file      Review of Systems  Constitutional:  Positive for appetite change (continued decreased/lack of appetite. has not started dronabinol  yet.), fatigue and unexpected weight change (lost 2 more lbs, down to 88 lbs, BMI 16.63). Negative for chills and fever.  HENT: Negative.  Negative for dental problem.   Eyes: Negative.   Respiratory: Negative.  Negative for cough, chest tightness, shortness of breath and wheezing.   Cardiovascular: Negative.  Negative for chest pain and palpitations.  Gastrointestinal:  Positive for abdominal distention, nausea and rectal pain (has an irritated small hemorrhoid).       Increased frequency and variable consistency of stool  Endocrine:       Temp fluctuations, hair loss, weight loss, need to order labs to rule out hormone abnormalities  Genitourinary:  Positive for frequency, menstrual problem, pelvic pain, vaginal bleeding, vaginal discharge and vaginal pain. Negative for difficulty urinating, dysuria, enuresis, flank pain and hematuria.       New onset urinary incontinence and pelvic/vaginal  pressure  Musculoskeletal:  Positive for myalgias.  Skin: Negative.   Allergic/Immunologic: Negative for immunocompromised state.  Neurological:  Positive for weakness (muscle weakness, body ache).  Hematological:  Bruises/bleeds easily.  Psychiatric/Behavioral:  The patient is nervous/anxious (expected due to current medical problem).     Vital Signs: BP 110/70   Pulse (!) 102   Temp 98.1 F (36.7 C)   Resp 16   Ht 5\' 1"  (1.549 m)   Wt 88 lb 12.8 oz (40.3 kg)   SpO2 98%   BMI 16.78 kg/m    Physical Exam Vitals reviewed.  Constitutional:      General: She is not in acute distress.    Appearance: Normal appearance. She is underweight. She is not ill-appearing.  HENT:     Head: Normocephalic and atraumatic.  Eyes:     Pupils: Pupils are equal, round, and reactive to light.  Cardiovascular:     Rate and Rhythm: Normal rate and regular rhythm.  Pulmonary:     Effort: Pulmonary effort is normal. No respiratory distress.  Neurological:     Mental Status: She is alert and oriented to  person, place, and time.  Psychiatric:        Mood and Affect: Mood normal.        Behavior: Behavior normal.        Assessment/Plan: 1. Unexplained weight loss Continue mirtazapine and megestrol as prescribed. Continue drinking 1-2 nutritional protein shakes of whichever brand is preferred such as ensure or muscle milk, etc.  - mirtazapine (REMERON) 15 MG tablet; Take 1 tablet (15 mg total) by mouth at bedtime.  Dispense: 90 tablet; Refill: 2 - megestrol (MEGACE) 40 MG tablet; Take 2 tablets (80 mg total) by mouth 2 (two) times daily.  Dispense: 120 tablet; Refill: 5  2. Decreased appetite Continue megestrol as prescribed.  - megestrol (MEGACE) 40 MG tablet; Take 2 tablets (80 mg total) by mouth 2 (two) times daily.  Dispense: 120 tablet; Refill: 5  3. Insomnia, unspecified type Continue mirtazapine as prescribed.  - mirtazapine (REMERON) 15 MG tablet; Take 1 tablet (15 mg total) by mouth at bedtime.  Dispense: 90 tablet; Refill: 2  4. GAD (generalized anxiety disorder) Continue mirtazapine as prescribed.  - mirtazapine (REMERON) 15 MG tablet; Take 1 tablet (15 mg total) by mouth at bedtime.  Dispense: 90 tablet; Refill: 2   General Counseling: Shanavia verbalizes understanding of the findings of todays visit and agrees with plan of treatment. I have discussed any further diagnostic evaluation that may be needed or ordered today. We also reviewed her medications today. she has been encouraged to call the office with any questions or concerns that should arise related to todays visit.    No orders of the defined types were placed in this encounter.   Meds ordered this encounter  Medications   mirtazapine (REMERON) 15 MG tablet    Sig: Take 1 tablet (15 mg total) by mouth at bedtime.    Dispense:  90 tablet    Refill:  2    Discontinue trazodone, fill new script today   megestrol (MEGACE) 40 MG tablet    Sig: Take 2 tablets (80 mg total) by mouth 2 (two) times daily.     Dispense:  120 tablet    Refill:  5    Return in about 3 months (around 03/23/2023) for F/U, Gibril Mastro PCP.   Total time spent:30 Minutes Time spent includes review of chart, medications, test results, and follow up plan with  the patient.   [place] Controlled Substance Database was reviewed by me.  This patient was seen by Sallyanne Kuster, FNP-C in collaboration with Dr. Beverely Risen as a part of collaborative care agreement.   Merced Brougham R. Tedd Sias, MSN, FNP-C Internal medicine

## 2022-12-30 ENCOUNTER — Encounter: Payer: 59 | Admitting: Nurse Practitioner

## 2023-01-02 ENCOUNTER — Encounter: Payer: Self-pay | Admitting: Nurse Practitioner

## 2023-01-14 ENCOUNTER — Ambulatory Visit: Payer: 59 | Admitting: Dermatology

## 2023-01-14 ENCOUNTER — Encounter: Payer: Self-pay | Admitting: Dermatology

## 2023-01-14 VITALS — BP 112/77 | HR 70

## 2023-01-14 DIAGNOSIS — M7989 Other specified soft tissue disorders: Secondary | ICD-10-CM | POA: Diagnosis not present

## 2023-01-14 DIAGNOSIS — L089 Local infection of the skin and subcutaneous tissue, unspecified: Secondary | ICD-10-CM

## 2023-01-14 DIAGNOSIS — D489 Neoplasm of uncertain behavior, unspecified: Secondary | ICD-10-CM

## 2023-01-14 DIAGNOSIS — L720 Epidermal cyst: Secondary | ICD-10-CM

## 2023-01-14 MED ORDER — DOXYCYCLINE MONOHYDRATE 100 MG PO CAPS: 100.0000 mg | ORAL_CAPSULE | Freq: Two times a day (BID) | ORAL | 0 refills | Status: AC

## 2023-01-14 NOTE — Patient Instructions (Addendum)
Start doxycycline 100 mg capsule take 1 by mouth twice daily for 1 week.  Take with food and drink, Avoid dairy.   Doxycycline should be taken with food to prevent nausea. Do not lay down for 30 minutes after taking. Be cautious with sun exposure and use good sun protection while on this medication. Pregnant women should not take this medication.   Can use mupirocine ointment - apply daily to affected area and cover with bandage until healed.     Biopsy Wound Care Instructions  Leave the original bandage on for 24 hours if possible.  If the bandage becomes soaked or soiled before that time, it is OK to remove it and examine the wound.  A small amount of post-operative bleeding is normal.  If excessive bleeding occurs, remove the bandage, place gauze over the site and apply continuous pressure (no peeking) over the area for 30 minutes. If this does not work, please call our clinic as soon as possible or page your doctor if it is after hours.   Once a day, cleanse the wound with soap and water. It is fine to shower. If a thick crust develops you may use a Q-tip dipped into dilute hydrogen peroxide (mix 1:1 with water) to dissolve it.  Hydrogen peroxide can slow the healing process, so use it only as needed.    After washing, apply petroleum jelly (Vaseline) or an antibiotic ointment if your doctor prescribed one for you, followed by a bandage.    For best healing, the wound should be covered with a layer of ointment at all times. If you are not able to keep the area covered with a bandage to hold the ointment in place, this may mean re-applying the ointment several times a day.  Continue this wound care until the wound has healed and is no longer open.   Itching and mild discomfort is normal during the healing process. However, if you develop pain or severe itching, please call our office.   If you have any discomfort, you can take Tylenol (acetaminophen) or ibuprofen as directed on the bottle.  (Please do not take these if you have an allergy to them or cannot take them for another reason).  Some redness, tenderness and white or yellow material in the wound is normal healing.  If the area becomes very sore and red, or develops a thick yellow-green material (pus), it may be infected; please notify us.    If you have stitches, return to clinic as directed to have the stitches removed. You will continue wound care for 2-3 days after the stitches are removed.   Wound healing continues for up to one year following surgery. It is not unusual to experience pain in the scar from time to time during the interval.  If the pain becomes severe or the scar thickens, you should notify the office.    A slight amount of redness in a scar is expected for the first six months.  After six months, the redness will fade and the scar will soften and fade.  The color difference becomes less noticeable with time.  If there are any problems, return for a post-op surgery check at your earliest convenience.  To improve the appearance of the scar, you can use silicone scar gel, cream, or sheets (such as Mederma or Serica) every night for up to one year. These are available over the counter (without a prescription).  Please call our office at [phone] for any questions or concerns.  Due to recent changes in healthcare laws, you may see results of your pathology and/or laboratory studies on MyChart before the doctors have had a chance to review them. We understand that in some cases there may be results that are confusing or concerning to you. Please understand that not all results are received at the same time and often the doctors may need to interpret multiple results in order to provide you with the best plan of care or course of treatment. Therefore, we ask that you please give [place] 2 business days to thoroughly review all your results before contacting the office for clarification. Should we see a  critical lab result, you will be contacted sooner.   If You Need Anything After Your Visit  If you have any questions or concerns for your doctor, please call our main line at [phone] and press option 4 to reach your doctor's medical assistant. If no one answers, please leave a voicemail as directed and we will return your call as soon as possible. Messages left after 4 pm will be answered the following business day.   You may also send [place] a message via MyChart. We typically respond to MyChart messages within 1-2 business days.  For prescription refills, please ask your pharmacy to contact our office. Our fax number is [phone].  If you have an urgent issue when the clinic is closed that cannot wait until the next business day, you can page your doctor at the number below.    Please note that while we do our best to be available for urgent issues outside of office hours, we are not available 24/7.   If you have an urgent issue and are unable to reach [place], you may choose to seek medical care at your doctor's office, retail clinic, urgent care center, or emergency room.  If you have a medical emergency, please immediately call 911 or go to the emergency department.  Pager Numbers  - Dr. Gwen Pounds: [phone]  - Dr. Neale Burly: [phone]  - Dr. Roseanne Reno: [phone]  In the event of inclement weather, please call our main line at [phone] for an update on the status of any delays or closures.  Dermatology Medication Tips: Please keep the boxes that topical medications come in in order to help keep track of the instructions about where and how to use these. Pharmacies typically print the medication instructions only on the boxes and not directly on the medication tubes.   If your medication is too expensive, please contact our office at [phone] option 4 or send [place] a message through MyChart.   We are unable to tell what your co-pay for medications will be in advance as  this is different depending on your insurance coverage. However, we may be able to find a substitute medication at lower cost or fill out paperwork to get insurance to cover a needed medication.   If a prior authorization is required to get your medication covered by your insurance company, please allow [place] 1-2 business days to complete this process.  Drug prices often vary depending on where the prescription is filled and some pharmacies may offer cheaper prices.  The website www.goodrx.com contains coupons for medications through different pharmacies. The prices here do not account for what the cost may be with help from insurance (it may be cheaper with your insurance), but the website can give you the price if you did not use any insurance.  - You can print the associated coupon and take it  with your prescription to the pharmacy.  - You may also stop by our office during regular business hours and pick up a GoodRx coupon card.  - If you need your prescription sent electronically to a different pharmacy, notify our office through [facility] or by phone at [phone] option 4.     Si Usted Necesita Algo Despus de Su Visita  Tambin puede enviarnos un mensaje a travs de Clinical cytogeneticist. Por lo general respondemos a los mensajes de MyChart en el transcurso de 1 a 2 das hbiles.  Para renovar recetas, por favor pida a su farmacia que se ponga en contacto con nuestra oficina. Annie Sable de fax es Eva [phone].  Si tiene un asunto urgente cuando la clnica est cerrada y que no puede esperar hasta el siguiente da hbil, puede llamar/localizar a su doctor(a) al nmero que aparece a continuacin.   Por favor, tenga en cuenta que aunque hacemos todo lo posible para estar disponibles para asuntos urgentes fuera del horario de [place], no estamos disponibles las 24 horas del da, los 7 [address]  [address] de la [place].   Si tiene un problema urgente y no puede comunicarse con nosotros, puede optar por  buscar atencin mdica  en el consultorio de su doctor(a), en una clnica privada, en un centro de atencin urgente o en una sala de emergencias.  Si tiene Engineer, drilling, por favor llame inmediatamente al 911 o vaya a la sala de emergencias.  Nmeros de bper  - Dr. Gwen Pounds: [phone]  - Dra. Moye: [phone]  - Dra. Roseanne Reno: [phone]  En caso de inclemencias del [place], por favor llame a Lacy Duverney principal al [phone] para una actualizacin sobre el [place] de cualquier retraso o cierre.  Consejos para la medicacin en dermatologa: Por favor, guarde las cajas en las que vienen los medicamentos de uso tpico para ayudarle a seguir las instrucciones sobre dnde y cmo usarlos. Las farmacias generalmente imprimen las instrucciones del medicamento slo en las cajas y no directamente en los tubos del [place].   Si su medicamento es muy caro, por favor, pngase en contacto con Rolm Gala llamando al [phone] y presione la opcin 4 o envenos un mensaje a travs de Clinical cytogeneticist.   No podemos decirle cul ser su copago por los medicamentos por adelantado ya que esto es diferente dependiendo de la cobertura de su seguro. Sin embargo, es posible que podamos encontrar un medicamento sustituto a Audiological scientist un formulario para que el seguro cubra el medicamento que se considera necesario.   Si se requiere una autorizacin previa para que su compaa de seguros [place] su medicamento, por favor permtanos de 1 a 2 das hbiles para completar [address]  Los precios de los medicamentos varan con frecuencia dependiendo del Environmental consultant de dnde se surte la receta y alguna farmacias pueden ofrecer precios ms baratos.  El sitio web www.goodrx.com tiene cupones para medicamentos de Health and safety inspector. Los precios aqu no tienen en cuenta lo que podra costar con la ayuda del seguro (puede ser ms barato con su seguro), pero el sitio web puede darle el precio si no  utiliz Tourist information centre manager.  - Puede imprimir el cupn correspondiente y llevarlo con su receta a la farmacia.  - Tambin puede pasar por nuestra oficina durante el horario de atencin regular y Education officer, museum una tarjeta de cupones de GoodRx.  - Si necesita que su receta se enve electrnicamente a Psychiatrist, informe a nuestra oficina a travs de MyChart de [employer]  [facility] o por telfono llamando al [phone] y presione la opcin 4.

## 2023-01-14 NOTE — Progress Notes (Addendum)
   Follow-Up Visit   Subjective  Mariah Bird is a 37 y.o. female who presents for the following: reports a spot that was bx at right buttock noticed a few weeks a hard spot at area, patient pressed on and reports some fluid came out. Since has been leaking and draining. Denies any pain.   The following portions of the chart were reviewed this encounter and updated as appropriate: medications, allergies, medical history  Review of Systems:  No other skin or systemic complaints except as noted in HPI or Assessment and Plan.  Objective  Well appearing patient in no apparent distress; mood and affect are within normal limits.    A focused examination was performed of the following areas: Right posterior buttock  Relevant exam findings are noted in the Assessment and Plan.  Right Hip (side) - Posterior Punctum with drainage  right posterior buttock 0.8 cm firm subcutaneous papule with puctum       Assessment & Plan     Neoplasm of uncertain behavior right posterior buttock  Skin / nail biopsy Type of biopsy: punch   Informed consent: discussed and consent obtained   Patient was prepped and draped in usual sterile fashion: Area prepped with alcohol. Anesthesia: the lesion was anesthetized in a standard fashion   Anesthetic:  1% lidocaine w/ epinephrine 1-100,000 buffered w/ 8.4% NaHCO3 Punch size:  6 mm Suture size:  4-0 Suture type: Prolene (polypropylene)   Suture removal (days):  14 Hemostasis achieved with: suture and pressure   Outcome: patient tolerated procedure well   Post-procedure details: wound care instructions given   Post-procedure details comment:  Ointment and small bandage applied.  Additional details:  4-0 vicryl in a buried vertical mattress to minimize risk of dehiscence 4-0 prolene simple interrupted  Anaerobic and Aerobic Culture  doxycycline (MONODOX) 100 MG capsule Take 1 capsule (100 mg total) by mouth 2 (two) times daily for 7 days.  Take with food and drink. Avoid dairy products.  Specimen 1 - Surgical pathology Differential Diagnosis: R/o scarring vs cyst vs or other   Check Margins: No  R/o scarring vs cyst vs other   Punch bx today   Sutures out in 2 weeks  Bacterial culture of small amount of drainage to r/o infection    Return for 2 week suture removal .  I, Asher Muir, CMA, am acting as scribe for Darden Dates, MD.   Documentation: I have reviewed the above documentation for accuracy and completeness, and I agree with the above.  Darden Dates, MD

## 2023-01-15 ENCOUNTER — Telehealth: Payer: Self-pay

## 2023-01-15 NOTE — Addendum Note (Signed)
Addended by: Sandi Mealy on: 01/15/2023 01:56 PM   Modules accepted: Level of Service

## 2023-01-15 NOTE — Telephone Encounter (Signed)
Patient states you called and schedule a appointment with Inetta Fermo and she has saw tina at [facility] and does not want to see her. She only wants to see Dr. Tobi Bastos. She wants to cancel the appointment with Inetta Fermo and schedule appointment with Dr. Tobi Bastos

## 2023-01-15 NOTE — Telephone Encounter (Signed)
Called patient back and had to let her know that I was able to put her back on her previous appointment date and time. I let her know that Dr. Tobi Bastos did not have anything sooner then 03/16/2023.

## 2023-01-19 ENCOUNTER — Telehealth: Payer: Self-pay

## 2023-01-19 LAB — ANAEROBIC AND AEROBIC CULTURE

## 2023-01-19 NOTE — Telephone Encounter (Signed)
Advised patient of results/hd  

## 2023-01-19 NOTE — Telephone Encounter (Signed)
-----   Message from Sandi Mealy, MD sent at 01/19/2023  2:05 PM EDT ----- Culture showed no growth, no antibiotic needed  MAs please call. Thank you!

## 2023-01-21 ENCOUNTER — Telehealth: Payer: Self-pay

## 2023-01-21 DIAGNOSIS — R21 Rash and other nonspecific skin eruption: Secondary | ICD-10-CM

## 2023-01-21 MED ORDER — MUPIROCIN 2 % EX OINT: 1.0000 | TOPICAL_OINTMENT | Freq: Every day | CUTANEOUS | 0 refills | Status: DC

## 2023-01-21 NOTE — Telephone Encounter (Signed)
-----   Message from [place], MD sent at 01/21/2023 10:21 AM EDT ----- Skin , right posterior buttock EPIDERMOID CYST AND NODULOCYSTIC FAT NECROSIS --> Cyst and death of the surrounding fat. The cyst is likely what was keeping this opening there. I removed as much as I could feel and get to. Some of the changes were quite deep, so if something comes back and gives trouble, I would recommend a full excision instead of punch. The original death of fat in the area and depression may have been related to inflammation around the cyst.  No other treatment needed if she is doing well right now.  MAs please call. Thank you!

## 2023-01-21 NOTE — Telephone Encounter (Signed)
Patient informed of pathology results 

## 2023-01-25 ENCOUNTER — Ambulatory Visit (INDEPENDENT_AMBULATORY_CARE_PROVIDER_SITE_OTHER): Payer: 59 | Admitting: Dermatology

## 2023-01-25 VITALS — BP 122/84 | HR 67

## 2023-01-25 DIAGNOSIS — L729 Follicular cyst of the skin and subcutaneous tissue, unspecified: Secondary | ICD-10-CM

## 2023-01-25 NOTE — Progress Notes (Signed)
   Follow-Up Visit   Subjective  Mariah Bird is a 37 y.o. female who presents for the following: Suture removal and follow up to discuss results at punch bx done at right posterior buttock   Pathology showed EPIDERMOID CYST AND NODULOCYSTIC FAT NECROSIS   The following portions of the chart were reviewed this encounter and updated as appropriate: medications, allergies, medical history  Review of Systems:  No other skin or systemic complaints except as noted in HPI or Assessment and Plan.  Objective  Well appearing patient in no apparent distress; mood and affect are within normal limits.  Areas Examined: Right posterior buttock  Relevant physical exam findings are noted in the Assessment and Plan.    Assessment & Plan    Encounter for Removal of Sutures - Incision site is clean, dry and intact. - Wound cleansed, sutures removed, wound cleansed and steri strips applied.  - Discussed pathology results showing EPIDERMOID CYST AND NODULOCYSTIC FAT NECROSIS -If recurs recommend excision  - Patient advised to keep steri-strips dry until they fall off. - Scars remodel for a full year. - Once steri-strips fall off, patient can apply over-the-counter silicone scar cream once to twice a day to help with scar remodeling if desired. - Patient advised to call with any concerns or if they notice any new or changing lesions.  Return if symptoms worsen or fail to improve.  I, Asher Muir, CMA, am acting as scribe for Willeen Niece, MD.   Documentation: I have reviewed the above documentation for accuracy and completeness, and I agree with the above.  Willeen Niece, MD

## 2023-01-25 NOTE — Patient Instructions (Addendum)
If recurs recommend excision    After Suture Removal  If your medical team has placed Steri-Strips (white adhesive strips covering the surgical site to provide extra support): Keep the area dry until they fall off.  Do not peel them off. Just let them fall off on their own.  If the edges peel up, you can trim them with scissors.   If your team has not placed Steri-Strips: Wash the area daily with soap and water. Then coat the incision site with plain Vaseline and cover with a bandage. Do this daily for 5 days after the sutures are removed. After that, no additional wound care is generally needed.  However, if you would like to help fade the scar, you can apply a silicone scar cream, gel or sheet every night. The scar will remodel for one year after the procedure. If a skin cancer was removed, be sure to keep your appointment with your dermatologist for follow-up and let your dermatology team know if you have any new or changing spots between visits.    Please call our office at [phone] for any questions or concerns.      Due to recent changes in healthcare laws, you may see results of your pathology and/or laboratory studies on MyChart before the doctors have had a chance to review them. We understand that in some cases there may be results that are confusing or concerning to you. Please understand that not all results are received at the same time and often the doctors may need to interpret multiple results in order to provide you with the best plan of care or course of treatment. Therefore, we ask that you please give [place] 2 business days to thoroughly review all your results before contacting the office for clarification. Should we see a critical lab result, you will be contacted sooner.   If You Need Anything After Your Visit  If you have any questions or concerns for your doctor, please call our main line at [phone] and press option 4 to reach your doctor's medical  assistant. If no one answers, please leave a voicemail as directed and we will return your call as soon as possible. Messages left after 4 pm will be answered the following business day.   You may also send [place] a message via MyChart. We typically respond to MyChart messages within 1-2 business days.  For prescription refills, please ask your pharmacy to contact our office. Our fax number is [phone].  If you have an urgent issue when the clinic is closed that cannot wait until the next business day, you can page your doctor at the number below.    Please note that while we do our best to be available for urgent issues outside of office hours, we are not available 24/7.   If you have an urgent issue and are unable to reach [place], you may choose to seek medical care at your doctor's office, retail clinic, urgent care center, or emergency room.  If you have a medical emergency, please immediately call 911 or go to the emergency department.  Pager Numbers  - Dr. Gwen Pounds: [phone]  - Dr. Neale Burly: [phone]  - Dr. Roseanne Reno: [phone]  In the event of inclement weather, please call our main line at [phone] for an update on the status of any delays or closures.  Dermatology Medication Tips: Please keep the boxes that topical medications come in in order to help keep track of the instructions about where and how to use  these. Pharmacies typically print the medication instructions only on the boxes and not directly on the medication tubes.   If your medication is too expensive, please contact our office at [phone] option 4 or send [place] a message through MyChart.   We are unable to tell what your co-pay for medications will be in advance as this is different depending on your insurance coverage. However, we may be able to find a substitute medication at lower cost or fill out paperwork to get insurance to cover a needed medication.   If a prior authorization is required to get your  medication covered by your insurance company, please allow [place] 1-2 business days to complete this process.  Drug prices often vary depending on where the prescription is filled and some pharmacies may offer cheaper prices.  The website www.goodrx.com contains coupons for medications through different pharmacies. The prices here do not account for what the cost may be with help from insurance (it may be cheaper with your insurance), but the website can give you the price if you did not use any insurance.  - You can print the associated coupon and take it with your prescription to the pharmacy.  - You may also stop by our office during regular business hours and pick up a GoodRx coupon card.  - If you need your prescription sent electronically to a different pharmacy, notify our office through [facility] or by phone at [phone] option 4.     Si Usted Necesita Algo Despus de Su Visita  Tambin puede enviarnos un mensaje a travs de Clinical cytogeneticist. Por lo general respondemos a los mensajes de MyChart en el transcurso de 1 a 2 das hbiles.  Para renovar recetas, por favor pida a su farmacia que se ponga en contacto con nuestra oficina. Annie Sable de fax es [place] [phone].  Si tiene un asunto urgente cuando la clnica est cerrada y que no puede esperar hasta el siguiente da hbil, puede llamar/localizar a su doctor(a) al nmero que aparece a continuacin.   Por favor, tenga en cuenta que aunque hacemos todo lo posible para estar disponibles para asuntos urgentes fuera del horario de [place], no estamos disponibles las 24 horas del da, los 7 [address]  [address] de la [place].   Si tiene un problema urgente y no puede comunicarse con nosotros, puede optar por buscar atencin mdica  en el consultorio de su doctor(a), en una clnica privada, en un centro de atencin urgente o en una sala de emergencias.  Si tiene Engineer, drilling, por favor llame inmediatamente al 911 o vaya a la sala de  emergencias.  Nmeros de bper  - Dr. Gwen Pounds: [phone]  - Dra. Moye: [phone]  - Dra. Roseanne Reno: [phone]  En caso de inclemencias del [place], por favor llame a Lacy Duverney principal al [phone] para una actualizacin sobre el [place] de cualquier retraso o cierre.  Consejos para la medicacin en dermatologa: Por favor, guarde las cajas en las que vienen los medicamentos de uso tpico para ayudarle a seguir las instrucciones sobre dnde y cmo usarlos. Las farmacias generalmente imprimen las instrucciones del medicamento slo en las cajas y no directamente en los tubos del [place].   Si su medicamento es muy caro, por favor, pngase en contacto con Rolm Gala llamando al [phone] y presione la opcin 4 o envenos un mensaje a travs de Clinical cytogeneticist.   No podemos decirle cul ser su copago por los medicamentos por adelantado ya que esto es diferente dependiendo de la  cobertura de su seguro. Sin embargo, es posible que podamos encontrar un medicamento sustituto a Audiological scientist un formulario para que el seguro cubra el medicamento que se considera necesario.   Si se requiere una autorizacin previa para que su compaa de seguros [place] su medicamento, por favor permtanos de 1 a 2 das hbiles para completar [address]  Los precios de los medicamentos varan con frecuencia dependiendo del Environmental consultant de dnde se surte la receta y alguna farmacias pueden ofrecer precios ms baratos.  El sitio web www.goodrx.com tiene cupones para medicamentos de Health and safety inspector. Los precios aqu no tienen en cuenta lo que podra costar con la ayuda del seguro (puede ser ms barato con su seguro), pero el sitio web puede darle el precio si no utiliz Tourist information centre manager.  - Puede imprimir el cupn correspondiente y llevarlo con su receta a la farmacia.  - Tambin puede pasar por nuestra oficina durante el horario de atencin regular y Education officer, museum una tarjeta de cupones de GoodRx.  - Si  necesita que su receta se enve electrnicamente a una farmacia diferente, informe a nuestra oficina a travs de MyChart de [place] o por telfono llamando al [phone] y presione la opcin 4.

## 2023-01-28 ENCOUNTER — Ambulatory Visit: Payer: 59 | Admitting: Physician Assistant

## 2023-02-02 ENCOUNTER — Ambulatory Visit (INDEPENDENT_AMBULATORY_CARE_PROVIDER_SITE_OTHER): Payer: 59 | Admitting: Nurse Practitioner

## 2023-02-02 ENCOUNTER — Encounter: Payer: Self-pay | Admitting: Nurse Practitioner

## 2023-02-02 VITALS — BP 110/72 | HR 77 | Temp 98.8°F | Resp 16 | Ht 61.0 in | Wt 90.2 lb

## 2023-02-02 DIAGNOSIS — G4709 Other insomnia: Secondary | ICD-10-CM | POA: Diagnosis not present

## 2023-02-02 DIAGNOSIS — R630 Anorexia: Secondary | ICD-10-CM | POA: Diagnosis not present

## 2023-02-02 DIAGNOSIS — Z0001 Encounter for general adult medical examination with abnormal findings: Secondary | ICD-10-CM | POA: Diagnosis not present

## 2023-02-02 DIAGNOSIS — R634 Abnormal weight loss: Secondary | ICD-10-CM | POA: Diagnosis not present

## 2023-02-02 DIAGNOSIS — Z119 Encounter for screening for infectious and parasitic diseases, unspecified: Secondary | ICD-10-CM | POA: Diagnosis not present

## 2023-02-02 DIAGNOSIS — R300 Dysuria: Secondary | ICD-10-CM

## 2023-02-02 MED ORDER — MIRTAZAPINE 30 MG PO TABS
30.0000 mg | ORAL_TABLET | Freq: Every day | ORAL | 5 refills | Status: DC
Start: 2023-02-02 — End: 2024-02-08

## 2023-02-02 NOTE — Progress Notes (Signed)
[facility] [address] [place], [place] [address]  Internal MEDICINE  Office Visit Note  Patient Name: Mariah Bird  [id number]  [id number]  Date of Service: 02/02/2023  Chief Complaint  Patient presents with   Depression   Gastroesophageal Reflux   Annual Exam    HPI [name] presents for an annual well visit and physical exam.  Well-appearing 37 y.o. female with poor appetite, unexplained weight loss,  Pap smear: due in 2026 Labs: deferred for later  New or worsening pain: none Other concerns: still concerned, not gaining much weight, frequent bouts of diarrhea, continued poor appetite.  Has a GI visit with Dr. Tobi Bastos at the end of July  Large tonsils and tonsil stones frequently and often sore and swollen. Wants to see ENT to talk about tonsillectomy.  Current Medication: Outpatient Encounter Medications as of 02/02/2023  Medication Sig   diphenoxylate-atropine (LOMOTIL) 2.5-0.025 MG tablet Take 1 tablet by mouth 4 (four) times daily as needed for diarrhea or loose stools.   ketorolac (TORADOL) 10 MG tablet Take 10 mg by mouth every 6 (six) hours as needed.   lidocaine (XYLOCAINE) 5 % ointment Apply 1 Application topically as needed.   loperamide (IMODIUM) 2 MG capsule Take 1 capsule (2 mg total) by mouth 4 (four) times daily as needed for diarrhea or loose stools.   megestrol (MEGACE) 40 MG tablet Take 2 tablets (80 mg total) by mouth 2 (two) times daily.   mirtazapine (REMERON) 30 MG tablet Take 1 tablet (30 mg total) by mouth at bedtime.   mupirocin ointment (BACTROBAN) 2 % Apply 1 Application topically daily. Buttock s/p biopsy   ondansetron (ZOFRAN-ODT) 4 MG disintegrating tablet Take 1 tablet (4 mg total) by mouth every 8 (eight) hours as needed for nausea or vomiting.   oxybutynin (DITROPAN) 5 MG tablet Take 1 tablet (5 mg total) by mouth every 8 (eight) hours as needed for bladder spasms.   oxyCODONE-acetaminophen (PERCOCET/ROXICET) 5-325 MG tablet Take  1 tablet by mouth every 6 (six) hours as needed for severe pain.   phenazopyridine (PYRIDIUM) 200 MG tablet Take 1 tablet (200 mg total) by mouth 3 (three) times daily as needed (for pain with urination).   promethazine (PHENERGAN) 12.5 MG tablet Take 1 tablet (12.5 mg total) by mouth every 6 (six) hours as needed for nausea or vomiting.   sucralfate (CARAFATE) 1 g tablet Take 1 g by mouth 3 (three) times daily as needed.   triamcinolone ointment (KENALOG) 0.1 % Apply twice daily to affected areas as needed for rash/itching. Avoid applying to face, groin, and axilla. (Patient taking differently: Apply topically as needed. Apply twice daily to affected areas as needed for rash/itching. Avoid applying to face, groin, and axilla.)   Vitamin D, Ergocalciferol, (DRISDOL) 1.25 MG (50000 UNIT) CAPS capsule Take 1 capsule (50,000 Units total) by mouth every 7 (seven) days. (Patient taking differently: Take 50,000 Units by mouth every 7 (seven) days. monday's)   [DISCONTINUED] mirtazapine (REMERON) 15 MG tablet Take 1 tablet (15 mg total) by mouth at bedtime.   pantoprazole (PROTONIX) 40 MG tablet Take 1 tablet (40 mg total) by mouth daily.   No facility-administered encounter medications on file as of 02/02/2023.    Surgical History: Past Surgical History:  Procedure Laterality Date   COLONOSCOPY WITH ESOPHAGOGASTRODUODENOSCOPY (EGD)  05/2022   CYSTOSCOPY/URETEROSCOPY/HOLMIUM LASER/STENT PLACEMENT Right 10/21/2022   Procedure: CYSTOSCOPY RIGHT URETEROSCOPY, RETROGRADE PYELOGRAM, STONE BASKETTING, AND RIGHT URTERAL STENT PLACEMENT;  Surgeon: Rene Paci, MD;  Location: [facility];  Service: Urology;  Laterality: Right;   KNEE ARTHROSCOPY Right    age 58   NASAL SINUS SURGERY  12/18/2016    Medical History: Past Medical History:  Diagnosis Date   Anxiety    Depression    Family history of adverse reaction to anesthesia    mother--- ponv   GERD (gastroesophageal reflux  disease)    History of gastritis 05/2022   History of kidney stones    Hypothyroidism    10-20-2022  per pt currently no meds ,followed by pcp   Lower urinary tract symptoms (LUTS)    Right ureteral calculus    Wears glasses     Family History: Family History  Problem Relation Age of Onset   Lupus Sister    Kidney disease Maternal Uncle    Kidney Stones Paternal Grandmother    Bladder Cancer Neg Hx    Kidney cancer Neg Hx    Prostate cancer Neg Hx     Social History   Socioeconomic History   Marital status: Single    Spouse name: Not on file   Number of children: Not on file   Years of education: Not on file   Highest education level: Not on file  Occupational History   Not on file  Tobacco Use   Smoking status: Never   Smokeless tobacco: Never  Vaping Use   Vaping Use: Never used  Substance and Sexual Activity   Alcohol use: Not Currently    Comment: 10-20-2022 stopped 2 wks ago   Drug use: Not Currently    Types: Marijuana    Comment: 10-20-2022 per pt last smoked marijuna 2 wks ago   Sexual activity: Not on file  Other Topics Concern   Not on file  Social History Narrative   Not on file   Social Determinants of Health   Financial Resource Strain: Not on file  Food Insecurity: Not on file  Transportation Needs: Not on file  Physical Activity: Not on file  Stress: Not on file  Social Connections: Not on file  Intimate Partner Violence: Not on file      Review of Systems  Constitutional:  Positive for appetite change (continued decreased/lack of appetite.  smoking marijuana helps as well as taking mirtazapine.) and unexpected weight change (back up to 90 lbs, BMI 17.04). Negative for chills, fatigue and fever.  HENT: Negative.  Negative for dental problem.        Tender on neck where tonsils are, often has swollen tonsils and tonsil stones.  Eyes: Negative.   Respiratory: Negative.  Negative for cough, chest tightness, shortness of breath and  wheezing.   Cardiovascular: Negative.  Negative for chest pain and palpitations.  Gastrointestinal:  Positive for abdominal distention, diarrhea and nausea. Negative for abdominal pain and rectal pain.       Increased frequency and variable consistency of stool  Endocrine:       Temp fluctuations, hair loss, weight loss, need to order labs to rule out hormone abnormalities  Genitourinary:  Negative for difficulty urinating, dysuria, enuresis, flank pain, frequency, hematuria, menstrual problem, pelvic pain, vaginal bleeding, vaginal discharge and vaginal pain.  Musculoskeletal:  Positive for myalgias.  Skin: Negative.   Allergic/Immunologic: Negative for immunocompromised state.  Neurological:  Positive for weakness (muscle weakness, body ache). Negative for headaches.  Hematological:  Bruises/bleeds easily.  Psychiatric/Behavioral:  Positive for sleep disturbance. Negative for behavioral problems, self-injury and suicidal ideas. The patient is nervous/anxious (expected due  to current medical problem).     Vital Signs: BP 110/72   Pulse 77   Temp 98.8 F (37.1 C)   Resp 16   Ht 5\' 1"  (1.549 m)   Wt 90 lb 3.2 oz (40.9 kg)   SpO2 99%   BMI 17.04 kg/m    Physical Exam Vitals reviewed.  Constitutional:      General: She is awake. She is not in acute distress.    Appearance: Normal appearance. She is well-groomed and underweight. She is not ill-appearing or diaphoretic.  HENT:     Head: Normocephalic and atraumatic.     Right Ear: Tympanic membrane, ear canal and external ear normal.     Left Ear: Tympanic membrane, ear canal and external ear normal.     Nose: Nose normal. No congestion or rhinorrhea.     Mouth/Throat:     Lips: Pink.     Mouth: Mucous membranes are moist.     Pharynx: Oropharynx is clear. Uvula midline. No oropharyngeal exudate or posterior oropharyngeal erythema.  Eyes:     General: Lids are normal. Vision grossly intact. Gaze aligned appropriately. No scleral  icterus.       Right eye: No discharge.        Left eye: No discharge.     Extraocular Movements: Extraocular movements intact.     Conjunctiva/sclera: Conjunctivae normal.     Pupils: Pupils are equal, round, and reactive to light.     Funduscopic exam:    Right eye: Red reflex present.        Left eye: Red reflex present. Neck:     Thyroid: No thyromegaly.     Vascular: No JVD.     Trachea: Trachea and phonation normal. No tracheal deviation.  Cardiovascular:     Rate and Rhythm: Normal rate and regular rhythm.     Pulses: Normal pulses.     Heart sounds: Normal heart sounds, S1 normal and S2 normal. No murmur heard.    No friction rub. No gallop.  Pulmonary:     Effort: Pulmonary effort is normal. No accessory muscle usage or respiratory distress.     Breath sounds: Normal breath sounds and air entry. No stridor. No wheezing or rales.  Chest:     Chest wall: No tenderness.     Comments: Declined clinical breast exam Abdominal:     General: Bowel sounds are normal. There is no distension.     Palpations: Abdomen is soft. There is no shifting dullness, fluid wave, mass or pulsatile mass.     Tenderness: There is no abdominal tenderness. There is no guarding or rebound.  Musculoskeletal:        General: No tenderness or deformity. Normal range of motion.     Cervical back: Normal range of motion and neck supple.     Right lower leg: No edema.     Left lower leg: No edema.  Lymphadenopathy:     Cervical: No cervical adenopathy.  Skin:    General: Skin is warm and dry.     Capillary Refill: Capillary refill takes less than 2 seconds.     Coloration: Skin is not pale.     Findings: No erythema or rash.  Neurological:     Mental Status: She is alert and oriented to person, place, and time.     Cranial Nerves: No cranial nerve deficit.     Motor: No abnormal muscle tone.     Coordination: Coordination normal.  Gait: Gait normal.     Deep Tendon Reflexes: Reflexes are  normal and symmetric.  Psychiatric:        Mood and Affect: Mood and affect normal.        Behavior: Behavior normal. Behavior is cooperative.        Thought Content: Thought content normal.        Judgment: Judgment normal.        Assessment/Plan: 1. Encounter for routine adult health examination with abnormal findings Age-appropriate preventive screenings and vaccinations discussed, annual physical exam completed. Routine labs for health maintenance deferred. Has had a lot of labs done his year. PHM updated.   2. Decreased appetite Increase mirtazapine dose  - mirtazapine (REMERON) 30 MG tablet; Take 1 tablet (30 mg total) by mouth at bedtime.  Dispense: 30 tablet; Refill: 5  3. Unexplained weight loss Increase mirtazapine dose  - mirtazapine (REMERON) 30 MG tablet; Take 1 tablet (30 mg total) by mouth at bedtime.  Dispense: 30 tablet; Refill: 5  4. Other insomnia Increase mirtazapine dose  - mirtazapine (REMERON) 30 MG tablet; Take 1 tablet (30 mg total) by mouth at bedtime.  Dispense: 30 tablet; Refill: 5  5. Screening examination for infectious disease Routine screenings, rule out - HIV antibody (with reflex) - Hepatitis C Antibody      General Counseling: Aubreyanna verbalizes understanding of the findings of todays visit and agrees with plan of treatment. I have discussed any further diagnostic evaluation that may be needed or ordered today. We also reviewed her medications today. she has been encouraged to call the office with any questions or concerns that should arise related to todays visit.    Orders Placed This Encounter  Procedures   HIV antibody (with reflex)   Hepatitis C Antibody    Meds ordered this encounter  Medications   mirtazapine (REMERON) 30 MG tablet    Sig: Take 1 tablet (30 mg total) by mouth at bedtime.    Dispense:  30 tablet    Refill:  5    Note increased dose, discontinue 15 mg tablet.    Return in about 3 months (around  05/05/2023) for F/U, Davidlee Jeanbaptiste PCP.   Total time spent:30 Minutes Time spent includes review of chart, medications, test results, and follow up plan with the patient.   [place] Controlled Substance Database was reviewed by me.  This patient was seen by Sallyanne Kuster, FNP-C in collaboration with Dr. Beverely Risen as a part of collaborative care agreement.  Marcin Holte R. Tedd Sias, MSN, FNP-C Internal medicine

## 2023-02-03 LAB — UA/M W/RFLX CULTURE, ROUTINE
Bilirubin, UA: NEGATIVE
Glucose, UA: NEGATIVE
Ketones, UA: NEGATIVE
Leukocytes,UA: NEGATIVE
Nitrite, UA: NEGATIVE
Protein,UA: NEGATIVE
RBC, UA: NEGATIVE
Specific Gravity, UA: 1.023 (ref 1.005–1.030)
Urobilinogen, Ur: 0.2 mg/dL (ref 0.2–1.0)
pH, UA: 6.5 (ref 5.0–7.5)

## 2023-02-03 LAB — MICROSCOPIC EXAMINATION
Casts: NONE SEEN /lpf
RBC, Urine: NONE SEEN /hpf (ref 0–2)

## 2023-02-04 ENCOUNTER — Telehealth: Payer: Self-pay | Admitting: Nurse Practitioner

## 2023-02-04 NOTE — Telephone Encounter (Signed)
Work note for 12/21/22 & 02/02/23 emailed to [employer]

## 2023-02-06 LAB — HEPATITIS C ANTIBODY: Hep C Virus Ab: NONREACTIVE

## 2023-02-06 LAB — HIV ANTIBODY (ROUTINE TESTING W REFLEX): HIV Screen 4th Generation wRfx: NONREACTIVE

## 2023-02-09 ENCOUNTER — Telehealth: Payer: Self-pay

## 2023-02-09 NOTE — Progress Notes (Signed)
HIV and hep C are nonreactive

## 2023-02-09 NOTE — Telephone Encounter (Signed)
Patient notified

## 2023-02-09 NOTE — Telephone Encounter (Signed)
-----   Message from Sallyanne Kuster, NP sent at 02/09/2023  7:43 AM EDT ----- HIV and hep C are nonreactive

## 2023-03-16 ENCOUNTER — Encounter: Payer: Self-pay | Admitting: Gastroenterology

## 2023-03-16 ENCOUNTER — Other Ambulatory Visit: Payer: Self-pay

## 2023-03-16 ENCOUNTER — Ambulatory Visit: Payer: 59 | Admitting: Gastroenterology

## 2023-03-16 VITALS — BP 133/82 | HR 73 | Temp 98.7°F | Ht 61.0 in | Wt 91.0 lb

## 2023-03-16 DIAGNOSIS — K589 Irritable bowel syndrome without diarrhea: Secondary | ICD-10-CM | POA: Insufficient documentation

## 2023-03-16 DIAGNOSIS — R1013 Epigastric pain: Secondary | ICD-10-CM | POA: Insufficient documentation

## 2023-03-16 DIAGNOSIS — R197 Diarrhea, unspecified: Secondary | ICD-10-CM

## 2023-03-16 DIAGNOSIS — R634 Abnormal weight loss: Secondary | ICD-10-CM | POA: Diagnosis not present

## 2023-03-16 DIAGNOSIS — R636 Underweight: Secondary | ICD-10-CM | POA: Insufficient documentation

## 2023-03-16 DIAGNOSIS — K625 Hemorrhage of anus and rectum: Secondary | ICD-10-CM | POA: Insufficient documentation

## 2023-03-16 DIAGNOSIS — R112 Nausea with vomiting, unspecified: Secondary | ICD-10-CM

## 2023-03-16 DIAGNOSIS — R1084 Generalized abdominal pain: Secondary | ICD-10-CM | POA: Insufficient documentation

## 2023-03-16 DIAGNOSIS — K219 Gastro-esophageal reflux disease without esophagitis: Secondary | ICD-10-CM | POA: Insufficient documentation

## 2023-03-16 DIAGNOSIS — K259 Gastric ulcer, unspecified as acute or chronic, without hemorrhage or perforation: Secondary | ICD-10-CM | POA: Insufficient documentation

## 2023-03-16 DIAGNOSIS — F1210 Cannabis abuse, uncomplicated: Secondary | ICD-10-CM | POA: Insufficient documentation

## 2023-03-16 HISTORY — DX: Nausea with vomiting, unspecified: R11.2

## 2023-03-16 MED ORDER — SUTAB 1479-225-188 MG PO TABS: ORAL_TABLET | ORAL | 0 refills | Status: DC

## 2023-03-16 NOTE — Addendum Note (Signed)
Addended by: Adela Ports on: 03/16/2023 03:10 PM   Modules accepted: Orders

## 2023-03-16 NOTE — Progress Notes (Signed)
[name] MD, MRCP(U.K) [address]  [address]  [place], [place] [address]  Main: [phone]  Fax: [phone]   Gastroenterology Consultation  Referring Provider:     Sallyanne Kuster, NP Primary Care Physician:  Sallyanne Kuster, NP Primary Gastroenterologist:  Dr. [name]  Reason for Consultation:     Weight loss, abdominal pain diarrhea decreased appetite        HPI:   Mariah Bird is a 37 y.o. y/o female referred for consultation & management  by Dr. Sallyanne Kuster, NP.     Here to see me for abnormal weight loss, decreased apetite , diarrhea , abdominal pain .  She says that she has lost 30- lbs 40lbsweight over the past few months, decreased appetite.  Profuse diarrhea multiple bowel movements a day lots of mucus no blood denies any NSAID use.  No vomiting.  Nausea on and off.  Smokes marijuana.  No cigarette smoking.  No clear inability to eat certain foods.  Has been told she may have Hashimoto's but is unsure.  She does not recollect has had a full evaluation yet concerned about the weight loss.   No other specific GI symptoms except diarrhea Past Medical History:  Diagnosis Date   Anxiety    Depression    Family history of adverse reaction to anesthesia    mother--- ponv   GERD (gastroesophageal reflux disease)    History of gastritis 05/2022   History of kidney stones    Hypothyroidism    10-20-2022  per pt currently no meds ,followed by pcp   Lower urinary tract symptoms (LUTS)    Nausea and vomiting 03/16/2023   Right ureteral calculus    Wears glasses     Past Surgical History:  Procedure Laterality Date   COLONOSCOPY WITH ESOPHAGOGASTRODUODENOSCOPY (EGD)  05/2022   CYSTOSCOPY/URETEROSCOPY/HOLMIUM LASER/STENT PLACEMENT Right 10/21/2022   Procedure: CYSTOSCOPY RIGHT URETEROSCOPY, RETROGRADE PYELOGRAM, STONE BASKETTING, AND RIGHT URTERAL STENT PLACEMENT;  Surgeon: Rene Paci, MD;  Location: [facility];   Service: Urology;  Laterality: Right;   KNEE ARTHROSCOPY Right    age 28   NASAL SINUS SURGERY  12/18/2016    Prior to Admission medications   Medication Sig Start Date End Date Taking? Authorizing Provider  citalopram (CELEXA) 20 MG tablet Take 20 mg by mouth daily.    [provider]  cyclobenzaprine (FLEXERIL) 10 MG tablet Take 10 mg by mouth at bedtime. 03/26/15   [provider]  diphenoxylate-atropine (LOMOTIL) 2.5-0.025 MG tablet Take 1 tablet by mouth 4 (four) times daily as needed for diarrhea or loose stools. 11/20/21   Sallyanne Kuster, NP  dronabinol (MARINOL) 5 MG capsule Take 5 mg by mouth daily before lunch.    [provider]  escitalopram (LEXAPRO) 20 MG tablet Take 20 mg by mouth daily.    [provider]  hydrOXYzine (VISTARIL) 25 MG capsule Take 25 mg by mouth daily.    [provider]  ketorolac (TORADOL) 10 MG tablet Take 10 mg by mouth every 6 (six) hours as needed.    [provider]  levocetirizine (XYZAL) 5 MG tablet Take 5 mg by mouth every evening. 05/20/15   [provider]  lidocaine (XYLOCAINE) 5 % ointment Apply 1 Application topically as needed. 05/18/22   Henrene Dodge, MD  loperamide (IMODIUM) 2 MG capsule Take 1 capsule (2 mg total) by mouth 4 (four) times daily as needed for diarrhea or loose stools. 04/23/22   Redwine,  Madison A, PA-C  megestrol (MEGACE) 40 MG tablet Take 2 tablets (80 mg total) by mouth 2 (two) times daily. 12/21/22   Sallyanne Kuster, NP  mirtazapine (REMERON) 30 MG tablet Take 1 tablet (30 mg total) by mouth at bedtime. 02/02/23   Sallyanne Kuster, NP  montelukast (SINGULAIR) 10 MG tablet Take 10 mg by mouth at bedtime. 05/20/15   [provider]  mupirocin ointment (BACTROBAN) 2 % Apply 1 Application topically daily. Buttock s/p biopsy 01/21/23   Moye, [place], MD  norethindrone-ethinyl estradiol (CYCLAFEM) 0.5/0.75/1-35 MG-MCG tablet Take 1 tablet by mouth daily. 05/11/15    [provider]  ondansetron (ZOFRAN-ODT) 4 MG disintegrating tablet Take 1 tablet (4 mg total) by mouth every 8 (eight) hours as needed for nausea or vomiting. 04/23/22   Redwine, Madison A, PA-C  oxybutynin (DITROPAN) 5 MG tablet Take 1 tablet (5 mg total) by mouth every 8 (eight) hours as needed for bladder spasms. 10/21/22   Rene Paci, MD  oxyCODONE-acetaminophen (PERCOCET/ROXICET) 5-325 MG tablet Take 1 tablet by mouth every 6 (six) hours as needed for severe pain. 10/17/22   Ernie Avena, MD  pantoprazole (PROTONIX) 40 MG tablet Take 1 tablet (40 mg total) by mouth daily. 04/23/22 10/21/22  Redwine, Madison A, PA-C  phenazopyridine (PYRIDIUM) 200 MG tablet Take 1 tablet (200 mg total) by mouth 3 (three) times daily as needed (for pain with urination). 10/21/22 10/21/23  Rene Paci, MD  promethazine (PHENERGAN) 12.5 MG tablet Take 1 tablet (12.5 mg total) by mouth every 6 (six) hours as needed for nausea or vomiting. 10/13/22   Loleta Rose, MD  sucralfate (CARAFATE) 1 g tablet Take 1 g by mouth 3 (three) times daily as needed. 06/24/22   [provider]  triamcinolone ointment (KENALOG) 0.1 % Apply twice daily to affected areas as needed for rash/itching. Avoid applying to face, groin, and axilla. Patient taking differently: Apply topically as needed. Apply twice daily to affected areas as needed for rash/itching. Avoid applying to face, groin, and axilla. 06/30/22   Moye, [place], MD  Vitamin D, Ergocalciferol, (DRISDOL) 1.25 MG (50000 UNIT) CAPS capsule Take 1 capsule (50,000 Units total) by mouth every 7 (seven) days. Patient taking differently: Take 50,000 Units by mouth every 7 (seven) days. monday's 08/31/22   Sallyanne Kuster, NP    Family History  Problem Relation Age of Onset   Lupus Sister    Kidney disease Maternal Uncle    Kidney Stones Paternal Grandmother    Bladder Cancer Neg Hx    Kidney cancer Neg Hx    Prostate cancer Neg Hx       Social History   Tobacco Use   Smoking status: Never   Smokeless tobacco: Never  Vaping Use   Vaping status: Never Used  Substance Use Topics   Alcohol use: Not Currently    Comment: 10-20-2022 stopped 2 wks ago   Drug use: Not Currently    Types: Marijuana    Comment: 10-20-2022 per pt last smoked marijuna 2 wks ago    Allergies as of 03/16/2023   (No Known Allergies)    Review of Systems:    All systems reviewed and negative except where noted in HPI.   Physical Exam:  BP 133/82   Pulse 73   Temp 98.7 F (37.1 C) (Oral)   Ht 5\' 1"  (1.549 m)   Wt 91 lb (41.3 kg)   BMI 17.19 kg/m  No LMP recorded. Psych:  Alert and cooperative. Normal  mood and affect. General:   Alert,  Well-developed, well-nourished, pleasant and cooperative in NAD Head:  Normocephalic and atraumatic. Eyes:  Sclera clear, no icterus.   Conjunctiva pink. Lungs:  Respirations even and unlabored.  Clear throughout to auscultation.   No wheezes, crackles, or rhonchi. No acute distress. Heart:  Regular rate and rhythm; no murmurs, clicks, rubs, or gallops. Abdomen:  Normal bowel sounds.  No bruits.  Soft, non-tender and non-distended without masses, hepatosplenomegaly or hernias noted.  No guarding or rebound tenderness.    Neurologic:  Alert and oriented x3;  grossly normal neurologically. Psych:  Alert and cooperative. Normal mood and affect.  Imaging Studies: No results found.  Assessment and Plan:   Mariah Bird is a 37 y.o. y/o female has been referred for weight loss, diarrhea ,    Plan  Celiac serology , food allergy panel, alpha gal panel , H pylori breath test , CBC,CMP, B12, vitamin C as she says she bruises easily.  Stool tests to rule out infection and inflammation EGD colonoscopy with biopsies of the small bowel and random colon biopsies.  If above tests are negative will require CT scan of the chest abdomen pelvis  I have discussed alternative options, risks & benefits,  which  include, but are not limited to, bleeding, infection, perforation,respiratory complication & drug reaction.  The patient agrees with this plan & written consent will be obtained.    Follow up in 6-8 weeks   Dr [name] MD,MRCP(U.K)

## 2023-03-22 NOTE — Progress Notes (Signed)
Inform  1. Has c diff and norovirus  2. Norovirus has no medications to treat, usually resolves on own  3. If having diarrhea for more than 7-10 days then commence on dificid if insurance permits or vancomycin 125 mg QID for 2 weeks .  Advise her to update [place] after this

## 2023-03-24 ENCOUNTER — Telehealth: Payer: Self-pay | Admitting: Nurse Practitioner

## 2023-03-24 ENCOUNTER — Telehealth: Payer: Self-pay | Admitting: Gastroenterology

## 2023-03-24 NOTE — Telephone Encounter (Signed)
Pt called and left message to get work note for 03/16/2023 her mother will pick note up on 03/25/2023

## 2023-03-24 NOTE — Telephone Encounter (Signed)
Work note will be sent via MyChart and printed.

## 2023-03-26 NOTE — Telephone Encounter (Signed)
Error

## 2023-03-30 NOTE — Progress Notes (Signed)
Mariah Bird did she need antibiotics?

## 2023-03-31 MED ORDER — VANCOMYCIN HCL 125 MG PO CAPS: 125.0000 mg | ORAL_CAPSULE | Freq: Four times a day (QID) | ORAL | 0 refills | Status: DC

## 2023-03-31 NOTE — Telephone Encounter (Signed)
Spoke with pt and made her appt tomorrow

## 2023-04-01 ENCOUNTER — Encounter: Payer: Self-pay | Admitting: Nurse Practitioner

## 2023-04-01 ENCOUNTER — Telehealth: Payer: 59 | Admitting: Nurse Practitioner

## 2023-04-01 VITALS — Resp 16 | Ht 61.0 in | Wt 91.0 lb

## 2023-04-01 DIAGNOSIS — A0472 Enterocolitis due to Clostridium difficile, not specified as recurrent: Secondary | ICD-10-CM | POA: Diagnosis not present

## 2023-04-01 DIAGNOSIS — J358 Other chronic diseases of tonsils and adenoids: Secondary | ICD-10-CM | POA: Diagnosis not present

## 2023-04-01 DIAGNOSIS — F411 Generalized anxiety disorder: Secondary | ICD-10-CM

## 2023-04-01 DIAGNOSIS — G4709 Other insomnia: Secondary | ICD-10-CM

## 2023-04-01 DIAGNOSIS — J351 Hypertrophy of tonsils: Secondary | ICD-10-CM

## 2023-04-01 NOTE — Progress Notes (Signed)
[facility] [address] [place], [place] [address]  Internal MEDICINE  Telephone Visit  Patient Name: Mariah Bird  [id number]  [id number]  Date of Service: 04/24/2023  I connected with the patient at 1620 by telephone and verified the patients identity using two identifiers.   I discussed the limitations, risks, security and privacy concerns of performing an evaluation and management service by telephone and the availability of in person appointments. I also discussed with the patient that there may be a patient responsible charge related to the service.  The patient expressed understanding and agrees to proceed.    Chief Complaint  Patient presents with   Telephone Screen    Discuss procedure and time off from work.    Telephone Assessment    HPI Mariah Bird presents for a telehealth virtual visit for anxiety, diarrhea and enlarged tonsils.  Colonoscopy on 9/5 -- has norovirus and cdiff infections, being treated by GI Having issues with swallowing as well as enlarged tonsils, and tonsils stones, wants to get her tonsils removed, requesting ENT referral.  Insomnia, poor sleep, hot flashes, poor appetite, increased anxiety and stress esp related to work and being sick at work Depression, tearful affected, depressed mood, anhedonia, increased stress at work.   Current Medication: Outpatient Encounter Medications as of 04/01/2023  Medication Sig   citalopram (CELEXA) 20 MG tablet Take 20 mg by mouth daily.   cyclobenzaprine (FLEXERIL) 10 MG tablet Take 10 mg by mouth at bedtime.   diphenoxylate-atropine (LOMOTIL) 2.5-0.025 MG tablet Take 1 tablet by mouth 4 (four) times daily as needed for diarrhea or loose stools.   dronabinol (MARINOL) 5 MG capsule Take 5 mg by mouth daily before lunch.   escitalopram (LEXAPRO) 20 MG tablet Take 20 mg by mouth daily.   hydrOXYzine (VISTARIL) 25 MG capsule Take 25 mg by mouth daily.   ketorolac (TORADOL) 10 MG tablet Take 10 mg by mouth  every 6 (six) hours as needed.   levocetirizine (XYZAL) 5 MG tablet Take 5 mg by mouth every evening.   lidocaine (XYLOCAINE) 5 % ointment Apply 1 Application topically as needed.   loperamide (IMODIUM) 2 MG capsule Take 1 capsule (2 mg total) by mouth 4 (four) times daily as needed for diarrhea or loose stools.   megestrol (MEGACE) 40 MG tablet Take 2 tablets (80 mg total) by mouth 2 (two) times daily.   mirtazapine (REMERON) 30 MG tablet Take 1 tablet (30 mg total) by mouth at bedtime.   montelukast (SINGULAIR) 10 MG tablet Take 10 mg by mouth at bedtime.   mupirocin ointment (BACTROBAN) 2 % Apply 1 Application topically daily. Buttock s/p biopsy   norethindrone-ethinyl estradiol (CYCLAFEM) 0.5/0.75/1-35 MG-MCG tablet Take 1 tablet by mouth daily.   ondansetron (ZOFRAN-ODT) 4 MG disintegrating tablet Take 1 tablet (4 mg total) by mouth every 8 (eight) hours as needed for nausea or vomiting.   oxybutynin (DITROPAN) 5 MG tablet Take 1 tablet (5 mg total) by mouth every 8 (eight) hours as needed for bladder spasms. (Patient not taking: Reported on 03/16/2023)   pantoprazole (PROTONIX) 40 MG tablet Take 1 tablet (40 mg total) by mouth daily.   phenazopyridine (PYRIDIUM) 200 MG tablet Take 1 tablet (200 mg total) by mouth 3 (three) times daily as needed (for pain with urination).   promethazine (PHENERGAN) 12.5 MG tablet Take 1 tablet (12.5 mg total) by mouth every 6 (six) hours as needed for nausea or vomiting.   Sodium Sulfate-Mag Sulfate-KCl (SUTAB) [phone] MG TABS At  5 PM take 12 tablets using the 8 oz cup provided in the kit drinking 5 cups of water and 5 hours before your procedure repeat the same process.   sucralfate (CARAFATE) 1 g tablet Take 1 g by mouth 3 (three) times daily as needed. (Patient not taking: Reported on 03/16/2023)   triamcinolone ointment (KENALOG) 0.1 % Apply twice daily to affected areas as needed for rash/itching. Avoid applying to face, groin, and axilla. (Patient  taking differently: Apply topically as needed. Apply twice daily to affected areas as needed for rash/itching. Avoid applying to face, groin, and axilla.)   Vitamin D, Ergocalciferol, (DRISDOL) 1.25 MG (50000 UNIT) CAPS capsule Take 1 capsule (50,000 Units total) by mouth every 7 (seven) days. (Patient taking differently: Take 50,000 Units by mouth every 7 (seven) days. monday's)   [DISCONTINUED] vancomycin (VANCOCIN) 125 MG capsule Take 1 capsule (125 mg total) by mouth 4 (four) times daily for 14 days.   No facility-administered encounter medications on file as of 04/01/2023.    Surgical History: Past Surgical History:  Procedure Laterality Date   COLONOSCOPY WITH ESOPHAGOGASTRODUODENOSCOPY (EGD)  05/2022   CYSTOSCOPY/URETEROSCOPY/HOLMIUM LASER/STENT PLACEMENT Right 10/21/2022   Procedure: CYSTOSCOPY RIGHT URETEROSCOPY, RETROGRADE PYELOGRAM, STONE BASKETTING, AND RIGHT URTERAL STENT PLACEMENT;  Surgeon: Rene Paci, MD;  Location: [facility];  Service: Urology;  Laterality: Right;   KNEE ARTHROSCOPY Right    age 37   NASAL SINUS SURGERY  12/18/2016    Medical History: Past Medical History:  Diagnosis Date   Anxiety    Depression    Family history of adverse reaction to anesthesia    mother--- ponv   GERD (gastroesophageal reflux disease)    History of gastritis 05/2022   History of kidney stones    Hypothyroidism    10-20-2022  per pt currently no meds ,followed by pcp   Lower urinary tract symptoms (LUTS)    Nausea and vomiting 03/16/2023   Right ureteral calculus    Wears glasses     Family History: Family History  Problem Relation Age of Onset   Lupus Sister    Kidney disease Maternal Uncle    Kidney Stones Paternal Grandmother    Bladder Cancer Neg Hx    Kidney cancer Neg Hx    Prostate cancer Neg Hx     Social History   Socioeconomic History   Marital status: Single    Spouse name: Not on file   Number of children: Not on file    Years of education: Not on file   Highest education level: Not on file  Occupational History   Not on file  Tobacco Use   Smoking status: Never   Smokeless tobacco: Never  Vaping Use   Vaping status: Never Used  Substance and Sexual Activity   Alcohol use: Not Currently    Comment: 10-20-2022 stopped 2 wks ago   Drug use: Not Currently    Types: Marijuana    Comment: 10-20-2022 per pt last smoked marijuna 2 wks ago   Sexual activity: Not on file  Other Topics Concern   Not on file  Social History Narrative   Not on file   Social Determinants of Health   Financial Resource Strain: Not on file  Food Insecurity: Not on file  Transportation Needs: Not on file  Physical Activity: Not on file  Stress: Not on file  Social Connections: Not on file  Intimate Partner Violence: Not on file      Review of  Systems  Constitutional:  Positive for appetite change (continued decreased/lack of appetite.  smoking marijuana helps as well as taking mirtazapine.) and unexpected weight change (back up to 90 lbs, BMI 17.04). Negative for chills, fatigue and fever.  HENT: Negative.  Negative for dental problem.        Tender on neck where tonsils are, often has swollen tonsils and tonsil stones.  Eyes: Negative.   Respiratory: Negative.  Negative for cough, chest tightness, shortness of breath and wheezing.   Cardiovascular: Negative.  Negative for chest pain and palpitations.  Gastrointestinal:  Positive for abdominal distention, diarrhea and nausea. Negative for abdominal pain and rectal pain.       Increased frequency and variable consistency of stool  Endocrine:       Temp fluctuations, hair loss, weight loss, need to order labs to rule out hormone abnormalities  Genitourinary:  Negative for difficulty urinating, dysuria, enuresis, flank pain, frequency, hematuria, menstrual problem, pelvic pain, vaginal bleeding, vaginal discharge and vaginal pain.  Musculoskeletal:  Positive for myalgias.   Skin: Negative.   Allergic/Immunologic: Negative for immunocompromised state.  Neurological:  Positive for weakness (muscle weakness, body ache). Negative for headaches.  Hematological:  Bruises/bleeds easily.  Psychiatric/Behavioral:  Positive for sleep disturbance. Negative for behavioral problems, self-injury and suicidal ideas. The patient is nervous/anxious (expected due to current medical problem).     Vital Signs: Resp 16   Ht 5\' 1"  (1.549 m)   Wt 91 lb (41.3 kg)   BMI 17.19 kg/m    Observation/Objective: She is emotional and tearful over video call. She is alert and oriented. No acute distress noted.     Assessment/Plan: 1. C. difficile diarrhea Being treated by GI for this and adenovirus. Waiting for vancomycin prescription to be available at her pharmacy.   2. Tonsil stone Plan for ENT referral soon  3. Enlarged tonsils Plan for ENT referral to have tonsillectomy, will contact provider with preferred specialist contact information   4. Other insomnia Plan to take FMLA to help decrease stress and anxiety while recovering from long standing GI infection that has finally been identified and is being treated. Hopefully this time to rest and recover will help improve her sleep.  5. GAD (generalized anxiety disorder) Increased anxiety and stress, wanting time off from work due to her physical illness and the impact of that and work on her mental health and stress level. Will send FMLA paperwork to the office to be completed.   General Counseling: lariana pullan understanding of the findings of today's phone visit and agrees with plan of treatment. I have discussed any further diagnostic evaluation that may be needed or ordered today. We also reviewed her medications today. she has been encouraged to call the office with any questions or concerns that should arise related to todays visit.  Return in about 1 month (around 05/02/2023) for F/U, Suman Trivedi PCP.   No orders  of the defined types were placed in this encounter.   No orders of the defined types were placed in this encounter.   Time spent:20 Minutes Time spent with patient included reviewing progress notes, labs, imaging studies, and discussing plan for follow up.  [place] Controlled Substance Database was reviewed by me for overdose risk score (ORS) if appropriate.  This patient was seen by Sallyanne Kuster, FNP-C in collaboration with Dr. Beverely Risen as a part of collaborative care agreement.  Shanquita Ronning R. Tedd Sias, MSN, FNP-C Internal medicine

## 2023-04-12 ENCOUNTER — Encounter: Payer: Self-pay | Admitting: Nurse Practitioner

## 2023-04-12 DIAGNOSIS — J358 Other chronic diseases of tonsils and adenoids: Secondary | ICD-10-CM

## 2023-04-12 DIAGNOSIS — J351 Hypertrophy of tonsils: Secondary | ICD-10-CM

## 2023-04-14 ENCOUNTER — Telehealth: Payer: Self-pay

## 2023-04-14 ENCOUNTER — Other Ambulatory Visit: Payer: Self-pay

## 2023-04-14 MED ORDER — VANCOMYCIN HCL 125 MG PO CAPS: 125.0000 mg | ORAL_CAPSULE | Freq: Four times a day (QID) | ORAL | 0 refills | Status: DC

## 2023-04-14 NOTE — Telephone Encounter (Signed)
Patient called stating that she had not been able to get her Vancomycin since her pharmacy keeps telling her that it's back ordered. Patient asked to send her prescription to [facility] in [place] to see if they have it. I told her that I would do that. Patient also had a question as if she was still able to have her EGD done on 04/22/2023. Please advise.

## 2023-04-15 ENCOUNTER — Encounter: Payer: Self-pay | Admitting: Gastroenterology

## 2023-04-15 NOTE — Telephone Encounter (Signed)
Egd has to wait to be done with colon once c diff is treated

## 2023-04-20 MED ORDER — FIDAXOMICIN 200 MG PO TABS
200.0000 mg | ORAL_TABLET | Freq: Two times a day (BID) | ORAL | 0 refills | Status: DC
Start: 2023-04-20 — End: 2024-02-08

## 2023-04-20 NOTE — Telephone Encounter (Signed)
As discussed if no vancomycin try dificid, hold off preocedure till c diff treated

## 2023-04-20 NOTE — Progress Notes (Signed)
error 

## 2023-04-20 NOTE — Telephone Encounter (Signed)
Called patient to let her know that we will have to cancel her upcoming procedure and sending in a new prescription to her pharmacy since her pharmacy stated that they have Vancomycin on back order. Dificid 200 MG BID for 10 days was sent to her pharmacy [facility] in [place], [place] off of [address]. Patient understands that she will need to take Dificid and reschedule her procedure EGD and Colonoscopy.

## 2023-04-21 ENCOUNTER — Telehealth: Payer: Self-pay | Admitting: Nurse Practitioner

## 2023-04-21 NOTE — Telephone Encounter (Signed)
Awaiting office notes for Otolaryngology referral-Toni

## 2023-04-22 ENCOUNTER — Encounter: Admission: RE | Payer: Self-pay | Source: Home / Self Care

## 2023-04-22 ENCOUNTER — Ambulatory Visit: Admission: RE | Admit: 2023-04-22 | Payer: 59 | Source: Home / Self Care | Admitting: Gastroenterology

## 2023-04-22 SURGERY — ESOPHAGOGASTRODUODENOSCOPY (EGD) WITH PROPOFOL
Anesthesia: General

## 2023-04-23 ENCOUNTER — Encounter: Payer: Self-pay | Admitting: Gastroenterology

## 2023-04-24 ENCOUNTER — Encounter: Payer: Self-pay | Admitting: Nurse Practitioner

## 2023-04-26 ENCOUNTER — Telehealth: Payer: Self-pay | Admitting: Nurse Practitioner

## 2023-04-26 NOTE — Telephone Encounter (Signed)
Otolaryngology referral faxed to Dr. Ernestene Kiel per patient request; [phone]. Notified patient. Gave pt telephone # [phone]

## 2023-04-30 ENCOUNTER — Encounter: Payer: Self-pay | Admitting: Gastroenterology

## 2023-05-03 ENCOUNTER — Other Ambulatory Visit: Payer: Self-pay

## 2023-05-03 DIAGNOSIS — R634 Abnormal weight loss: Secondary | ICD-10-CM

## 2023-05-03 DIAGNOSIS — R197 Diarrhea, unspecified: Secondary | ICD-10-CM

## 2023-05-03 MED ORDER — NA SULFATE-K SULFATE-MG SULF 17.5-3.13-1.6 GM/177ML PO SOLN: 354.0000 mL | Freq: Once | ORAL | 0 refills | Status: AC

## 2023-05-05 ENCOUNTER — Ambulatory Visit: Payer: 59 | Admitting: Nurse Practitioner

## 2023-05-11 ENCOUNTER — Other Ambulatory Visit: Payer: Self-pay

## 2023-05-11 ENCOUNTER — Encounter: Payer: Self-pay | Admitting: Gastroenterology

## 2023-05-18 ENCOUNTER — Ambulatory Visit
Admission: RE | Admit: 2023-05-18 | Discharge: 2023-05-18 | Disposition: A | Discharge status: Home / Self Care | Payer: 59 | Attending: Gastroenterology | Admitting: Gastroenterology

## 2023-05-18 ENCOUNTER — Other Ambulatory Visit: Payer: Self-pay

## 2023-05-18 ENCOUNTER — Encounter: Payer: Self-pay | Admitting: Gastroenterology

## 2023-05-18 ENCOUNTER — Ambulatory Visit: Payer: 59 | Admitting: Anesthesiology

## 2023-05-18 ENCOUNTER — Encounter
Admission: RE | Disposition: A | Discharge status: Home / Self Care | Payer: Self-pay | Source: Home / Self Care | Attending: Gastroenterology

## 2023-05-18 DIAGNOSIS — K529 Noninfective gastroenteritis and colitis, unspecified: Secondary | ICD-10-CM | POA: Insufficient documentation

## 2023-05-18 DIAGNOSIS — K259 Gastric ulcer, unspecified as acute or chronic, without hemorrhage or perforation: Secondary | ICD-10-CM | POA: Insufficient documentation

## 2023-05-18 DIAGNOSIS — K2970 Gastritis, unspecified, without bleeding: Secondary | ICD-10-CM | POA: Diagnosis not present

## 2023-05-18 DIAGNOSIS — K2980 Duodenitis without bleeding: Secondary | ICD-10-CM | POA: Diagnosis not present

## 2023-05-18 DIAGNOSIS — Z87442 Personal history of urinary calculi: Secondary | ICD-10-CM | POA: Insufficient documentation

## 2023-05-18 DIAGNOSIS — K219 Gastro-esophageal reflux disease without esophagitis: Secondary | ICD-10-CM | POA: Insufficient documentation

## 2023-05-18 DIAGNOSIS — R197 Diarrhea, unspecified: Secondary | ICD-10-CM

## 2023-05-18 DIAGNOSIS — K3189 Other diseases of stomach and duodenum: Secondary | ICD-10-CM | POA: Insufficient documentation

## 2023-05-18 DIAGNOSIS — K31A0 Gastric intestinal metaplasia, unspecified: Secondary | ICD-10-CM | POA: Diagnosis not present

## 2023-05-18 DIAGNOSIS — R634 Abnormal weight loss: Secondary | ICD-10-CM | POA: Diagnosis not present

## 2023-05-18 HISTORY — PX: COLONOSCOPY WITH PROPOFOL: SHX5780

## 2023-05-18 HISTORY — PX: ESOPHAGOGASTRODUODENOSCOPY (EGD) WITH PROPOFOL: SHX5813

## 2023-05-18 SURGERY — COLONOSCOPY WITH PROPOFOL
Anesthesia: General

## 2023-05-18 MED ORDER — SODIUM CHLORIDE 0.9 % IV SOLN: INTRAVENOUS | Status: DC

## 2023-05-18 MED ORDER — GLYCOPYRROLATE 0.2 MG/ML IJ SOLN
INTRAMUSCULAR | Status: DC | PRN
  Administered 2023-05-18: .2 mg via INTRAVENOUS

## 2023-05-18 MED ORDER — LIDOCAINE HCL (CARDIAC) PF 100 MG/5ML IV SOSY
PREFILLED_SYRINGE | INTRAVENOUS | Status: DC | PRN
  Administered 2023-05-18: 40 mg via INTRAVENOUS

## 2023-05-18 MED ORDER — DEXMEDETOMIDINE HCL IN NACL 200 MCG/50ML IV SOLN
INTRAVENOUS | Status: DC | PRN
Start: 2023-05-18 — End: 2023-05-18
  Administered 2023-05-18: 20 ug via INTRAVENOUS

## 2023-05-18 MED ORDER — PROPOFOL 10 MG/ML IV BOLUS
INTRAVENOUS | Status: DC | PRN
Start: 2023-05-18 — End: 2023-05-18
  Administered 2023-05-18: 50 mg via INTRAVENOUS

## 2023-05-18 MED ORDER — PROPOFOL 500 MG/50ML IV EMUL
INTRAVENOUS | Status: DC | PRN
  Administered 2023-05-18: 125 ug/kg/min via INTRAVENOUS

## 2023-05-18 NOTE — Anesthesia Preprocedure Evaluation (Signed)
Anesthesia Evaluation  Patient identified by MRN, date of birth, ID band Patient awake    Reviewed: Allergy & Precautions, H&P , NPO status , Patient's Chart, lab work & pertinent test results  Airway Mallampati: II  TM Distance: >3 FB Neck ROM: Full    Dental no notable dental hx.    Pulmonary neg pulmonary ROS   Pulmonary exam normal        Cardiovascular negative cardio ROS  Rhythm:Regular Rate:Normal     Neuro/Psych   Anxiety Depression    negative neurological ROS  negative psych ROS   GI/Hepatic Neg liver ROS,GERD  ,,  Endo/Other  Hypothyroidism    Renal/GU negative Renal ROSRight ureteral calculi   negative genitourinary   Musculoskeletal negative musculoskeletal ROS (+)    Abdominal Normal abdominal exam  (+)   Peds negative pediatric ROS (+)  Hematology negative hematology ROS (+)   Anesthesia Other Findings   Reproductive/Obstetrics negative OB ROS                              Anesthesia Physical Anesthesia Plan  ASA: 2  Anesthesia Plan: General   Post-op Pain Management:    Induction: Intravenous  PONV Risk Score and Plan: 3 and TIVA and Propofol infusion  Airway Management Planned: LMA, Double Lumen EBT and Natural Airway  Additional Equipment: None  Intra-op Plan:   Post-operative Plan:   Informed Consent: I have reviewed the patients History and Physical, chart, labs and discussed the procedure including the risks, benefits and alternatives for the proposed anesthesia with the patient or authorized representative who has indicated his/her understanding and acceptance.     Dental advisory given  Plan Discussed with:   Anesthesia Plan Comments:         Anesthesia Quick Evaluation

## 2023-05-18 NOTE — H&P (Signed)
[name], MD [address], [place], [place], [address] [address], [address], [place], [place], [address] Phone: [phone]  Fax: [phone]  Primary Care Physician:  Sallyanne Kuster, NP   Pre-Procedure History & Physical: HPI:  Mariah Bird is a 37 y.o. female is here for an endoscopy and colonoscopy    Past Medical History:  Diagnosis Date   Anxiety    Depression    Family history of adverse reaction to anesthesia    mother--- ponv   GERD (gastroesophageal reflux disease)    History of gastritis 05/2022   History of kidney stones    Hypothyroidism    10-20-2022  per pt currently no meds ,followed by pcp   Lower urinary tract symptoms (LUTS)    Nausea and vomiting 03/16/2023   Right ureteral calculus    Wears glasses     Past Surgical History:  Procedure Laterality Date   COLONOSCOPY WITH ESOPHAGOGASTRODUODENOSCOPY (EGD)  05/2022   CYSTOSCOPY/URETEROSCOPY/HOLMIUM LASER/STENT PLACEMENT Right 10/21/2022   Procedure: CYSTOSCOPY RIGHT URETEROSCOPY, RETROGRADE PYELOGRAM, STONE BASKETTING, AND RIGHT URTERAL STENT PLACEMENT;  Surgeon: Rene Paci, MD;  Location: [facility];  Service: Urology;  Laterality: Right;   KNEE ARTHROSCOPY Right    age 63   NASAL SINUS SURGERY  12/18/2016    Prior to Admission medications   Medication Sig Start Date End Date Taking? Authorizing Provider  citalopram (CELEXA) 20 MG tablet Take 20 mg by mouth daily.    [provider]  cyclobenzaprine (FLEXERIL) 10 MG tablet Take 10 mg by mouth at bedtime. 03/26/15   [provider]  diphenoxylate-atropine (LOMOTIL) 2.5-0.025 MG tablet Take 1 tablet by mouth 4 (four) times daily as needed for diarrhea or loose stools. 11/20/21   Sallyanne Kuster, NP  dronabinol (MARINOL) 5 MG capsule Take 5 mg by mouth daily before lunch.    [provider]  escitalopram (LEXAPRO) 20 MG tablet Take 20 mg by mouth daily.    [provider]  fidaxomicin (DIFICID) 200 MG TABS tablet Take 1 tablet (200 mg total) by mouth 2 (two) times daily. 04/20/23   [name], MD  hydrOXYzine (VISTARIL) 25 MG capsule Take 25 mg by mouth daily.    [provider]  ketorolac (TORADOL) 10 MG tablet Take 10 mg by mouth every 6 (six) hours as needed.    [provider]  levocetirizine (XYZAL) 5 MG tablet Take 5 mg by mouth every evening. 05/20/15   [provider]  lidocaine (XYLOCAINE) 5 % ointment Apply 1 Application topically as needed. 05/18/22   Henrene Dodge, MD  loperamide (IMODIUM) 2 MG capsule Take 1 capsule (2 mg total) by mouth 4 (four) times daily as needed for diarrhea or loose stools. 04/23/22   Redwine, Madison A, PA-C  megestrol (MEGACE) 40 MG tablet Take 2 tablets (80 mg total) by mouth 2 (two) times daily. 12/21/22   Sallyanne Kuster, NP  mirtazapine (REMERON) 30 MG tablet Take 1 tablet (30 mg total) by mouth at bedtime. 02/02/23   Sallyanne Kuster, NP  montelukast (SINGULAIR) 10 MG tablet Take 10 mg by mouth at bedtime. 05/20/15   [provider]  mupirocin ointment (BACTROBAN) 2 % Apply 1 Application topically daily. Buttock s/p biopsy 01/21/23   Moye, [place], MD  norethindrone-ethinyl estradiol (CYCLAFEM) 0.5/0.75/1-35 MG-MCG tablet Take 1 tablet by mouth daily. 05/11/15   [provider]  ondansetron (ZOFRAN-ODT) 4 MG disintegrating tablet Take 1 tablet (4 mg total) by  mouth every 8 (eight) hours as needed for nausea or vomiting. 04/23/22   Redwine, Madison A, PA-C  oxybutynin (DITROPAN) 5 MG tablet Take 1 tablet (5 mg total) by mouth every 8 (eight) hours as needed for bladder spasms. Patient not taking: Reported on 03/16/2023 10/21/22   Rene Paci, MD  pantoprazole (PROTONIX) 40 MG tablet Take 1 tablet (40 mg total) by mouth daily. 04/23/22 10/21/22  Redwine, Madison A, PA-C  phenazopyridine (PYRIDIUM) 200 MG tablet Take 1 tablet (200 mg total) by mouth 3 (three) times daily  as needed (for pain with urination). 10/21/22 10/21/23  Rene Paci, MD  promethazine (PHENERGAN) 12.5 MG tablet Take 1 tablet (12.5 mg total) by mouth every 6 (six) hours as needed for nausea or vomiting. 10/13/22   Loleta Rose, MD  Sodium Sulfate-Mag Sulfate-KCl (SUTAB) [id number] MG TABS At 5 PM take 12 tablets using the 8 oz cup provided in the kit drinking 5 cups of water and 5 hours before your procedure repeat the same process. 03/16/23   [name], MD  sucralfate (CARAFATE) 1 g tablet Take 1 g by mouth 3 (three) times daily as needed. Patient not taking: Reported on 03/16/2023 06/24/22   [provider]  triamcinolone ointment (KENALOG) 0.1 % Apply twice daily to affected areas as needed for rash/itching. Avoid applying to face, groin, and axilla. Patient taking differently: Apply topically as needed. Apply twice daily to affected areas as needed for rash/itching. Avoid applying to face, groin, and axilla. 06/30/22   Moye, [place], MD  Vitamin D, Ergocalciferol, (DRISDOL) 1.25 MG (50000 UNIT) CAPS capsule Take 1 capsule (50,000 Units total) by mouth every 7 (seven) days. Patient taking differently: Take 50,000 Units by mouth every 7 (seven) days. monday's 08/31/22   Sallyanne Kuster, NP    Allergies as of 05/04/2023   (No Known Allergies)    Family History  Problem Relation Age of Onset   Lupus Sister    Kidney disease Maternal Uncle    Kidney Stones Paternal Grandmother    Bladder Cancer Neg Hx    Kidney cancer Neg Hx    Prostate cancer Neg Hx     Social History   Socioeconomic History   Marital status: Single    Spouse name: Not on file   Number of children: Not on file   Years of education: Not on file   Highest education level: Not on file  Occupational History   Not on file  Tobacco Use   Smoking status: Never   Smokeless tobacco: Never  Vaping Use   Vaping status: Never Used  Substance and Sexual Activity   Alcohol use: Not Currently     Comment: 10-20-2022 stopped 2 wks ago   Drug use: Not Currently    Types: Marijuana    Comment: 10-20-2022 per pt last smoked marijuna 2 wks ago   Sexual activity: Not on file  Other Topics Concern   Not on file  Social History Narrative   Not on file   Social Determinants of Health   Financial Resource Strain: Not on file  Food Insecurity: Not on file  Transportation Needs: Not on file  Physical Activity: Not on file  Stress: Not on file  Social Connections: Not on file  Intimate Partner Violence: Not on file    Review of Systems: See HPI, otherwise negative ROS  Physical Exam: LMP 05/17/2023 Comment: patient states started period on 05/17/23; also is not sexually active with anyone at this time General:  Alert,  pleasant and cooperative in NAD Head:  Normocephalic and atraumatic. Neck:  Supple; no masses or thyromegaly. Lungs:  Clear throughout to auscultation, normal respiratory effort.    Heart:  +S1, +S2, Regular rate and rhythm, No edema. Abdomen:  Soft, nontender and nondistended. Normal bowel sounds, without guarding, and without rebound.   Neurologic:  Alert and  oriented x4;  grossly normal neurologically.  Impression/Plan: Mariah Bird is here for an endoscopy and colonoscopy  to be performed for  evaluation of weight loss    Risks, benefits, limitations, and alternatives regarding endoscopy have been reviewed with the patient.  Questions have been answered.  All parties agreeable.   [name], MD  05/18/2023, 9:30 AM

## 2023-05-18 NOTE — Anesthesia Postprocedure Evaluation (Signed)
Anesthesia Post Note  Patient: Mariah Bird  Procedure(s) Performed: COLONOSCOPY WITH PROPOFOL ESOPHAGOGASTRODUODENOSCOPY (EGD) WITH PROPOFOL  Patient location during evaluation: Endoscopy Anesthesia Type: General Level of consciousness: awake and alert Pain management: pain level controlled Vital Signs Assessment: post-procedure vital signs reviewed and stable Respiratory status: spontaneous breathing, nonlabored ventilation and respiratory function stable Cardiovascular status: blood pressure returned to baseline and stable Postop Assessment: no apparent nausea or vomiting Anesthetic complications: no   No notable events documented.   Last Vitals:  Vitals:   05/18/23 1038 05/18/23 1048  BP: 100/68 96/72  Pulse: 69   Resp: 18 17  Temp:    SpO2: 100%     Last Pain:  Vitals:   05/18/23 1038  TempSrc:   PainSc: 0-No pain                 Foye Deer

## 2023-05-18 NOTE — Transfer of Care (Signed)
Immediate Anesthesia Transfer of Care Note  Patient: Denajah Farias  Procedure(s) Performed: COLONOSCOPY WITH PROPOFOL ESOPHAGOGASTRODUODENOSCOPY (EGD) WITH PROPOFOL  Patient Location: PACU  Anesthesia Type:General  Level of Consciousness: sedated  Airway & Oxygen Therapy: Patient Spontanous Breathing and Patient connected to nasal cannula oxygen  Post-op Assessment: Report given to RN and Post -op Vital signs reviewed and stable  Post vital signs: Reviewed and stable  Last Vitals:  Vitals Value Taken Time  BP    Temp    Pulse    Resp    SpO2      Last Pain:  Vitals:   05/18/23 0937  TempSrc: Temporal         Complications: No notable events documented.

## 2023-05-18 NOTE — Op Note (Signed)
[facility] Gastroenterology Patient Name: Mariah Bird Procedure Date: 05/18/2023 9:50 AM MRN: [id number] Account #: [id number] Date of Birth: September 12, 1985 Admit Type: Outpatient Age: 37 Room: [facility] ENDO ROOM 2 Gender: Female Note Status: Finalized Instrument Name: Upper Endoscope [id number] Procedure:             Upper GI endoscopy Indications:           Diarrhea Providers:             [name] MD, MD Referring MD:          Sallyanne Kuster (Referring MD) Medicines:             Monitored Anesthesia Care Complications:         No immediate complications. Procedure:             Pre-Anesthesia Assessment:                        - Prior to the procedure, a History and Physical was                         performed, and patient medications, allergies and                         sensitivities were reviewed. The patient's tolerance                         of previous anesthesia was reviewed.                        - The risks and benefits of the procedure and the                         sedation options and risks were discussed with the                         patient. All questions were answered and informed                         consent was obtained.                        - ASA Grade Assessment: II - A patient with mild                         systemic disease.                        After obtaining informed consent, the endoscope was                         passed under direct vision. Throughout the procedure,                         the patient's blood pressure, pulse, and oxygen                         saturations were monitored continuously. The                         Endosonoscope was introduced through the  mouth, and                         advanced to the third part of duodenum. The upper GI                         endoscopy was accomplished with ease. The patient                         tolerated the procedure well. Findings:      The esophagus was  normal.      Localized moderate inflammation characterized by congestion (edema) and       erythema was found on the greater curvature of the stomach. Biopsies       were taken with a cold forceps for histology.      The examined duodenum was normal. Biopsies were taken with a cold       forceps for histology. Impression:            - Normal esophagus.                        - Gastritis. Biopsied.                        - Normal examined duodenum. Biopsied. Recommendation:        - Await pathology results.                        - Perform a colonoscopy today. Procedure Code(s):     --- Professional ---                        [id number], Esophagogastroduodenoscopy, flexible,                         transoral; with biopsy, single or multiple Diagnosis Code(s):     --- Professional ---                        K29.70, Gastritis, unspecified, without bleeding                        R19.7, Diarrhea, unspecified CPT copyright 2022 American Medical Association. All rights reserved. The codes documented in this report are preliminary and upon coder review may  be revised to meet current compliance requirements. [name], MD [name] MD, MD 05/18/2023 10:14:35 AM This report has been signed electronically. Number of Addenda: 0 Note Initiated On: 05/18/2023 9:50 AM Estimated Blood Loss:  Estimated blood loss: none.      [facility]

## 2023-05-18 NOTE — Op Note (Signed)
[facility] Gastroenterology Patient Name: Mariah Bird Procedure Date: 05/18/2023 9:50 AM MRN: [id number] Account #: [id number] Date of Birth: [birth year] Admit Type: Outpatient Age: 37 Room: [facility] ENDO ROOM 2 Gender: Female Note Status: Finalized Instrument Name: Peds Colonoscope [id number] Procedure:             Colonoscopy Indications:           Chronic diarrhea Providers:             [name] MD, MD Referring MD:          Sallyanne Kuster (Referring MD) Medicines:             Monitored Anesthesia Care Complications:         No immediate complications. Procedure:             Pre-Anesthesia Assessment:                        - Prior to the procedure, a History and Physical was                         performed, and patient medications, allergies and                         sensitivities were reviewed. The patient's tolerance                         of previous anesthesia was reviewed.                        - The risks and benefits of the procedure and the                         sedation options and risks were discussed with the                         patient. All questions were answered and informed                         consent was obtained.                        - ASA Grade Assessment: II - A patient with mild                         systemic disease.                        After obtaining informed consent, the colonoscope was                         passed under direct vision. Throughout the procedure,                         the patient's blood pressure, pulse, and oxygen                         saturations were monitored continuously. The                         Colonoscope was introduced through the anus  and                         advanced to the the cecum, identified by the                         appendiceal orifice. The colonoscopy was performed                         with ease. The patient tolerated the procedure well.                          The quality of the bowel preparation was excellent.                         The ileocecal valve, appendiceal orifice, and rectum                         were photographed. Findings:      The colon (entire examined portion) appeared normal. Biopsies were taken       with a cold forceps for histology.      The exam was otherwise without abnormality on direct and retroflexion       views. Impression:            - The entire examined colon is normal. Biopsied.                        - The examination was otherwise normal on direct and                         retroflexion views. Recommendation:        - Discharge patient to home (with escort).                        - Resume previous diet.                        - Continue present medications.                        - Await pathology results.                        - Return to GI office as previously scheduled. Procedure Code(s):     --- Professional ---                        [phone], Colonoscopy, flexible; with biopsy, single or                         multiple Diagnosis Code(s):     --- Professional ---                        K52.9, Noninfective gastroenteritis and colitis,                         unspecified CPT copyright 2022 American Medical Association. All rights reserved. The codes documented in this report are preliminary and upon coder review may  be revised to meet current compliance requirements. [name], MD Sharlet Salina  Tobi Bastos MD, MD 05/18/2023 10:27:08 AM This report has been signed electronically. Number of Addenda: 0 Note Initiated On: 05/18/2023 9:50 AM Scope Withdrawal Time: 0 hours 6 minutes 23 seconds  Total Procedure Duration: 0 hours 8 minutes 12 seconds  Estimated Blood Loss:  Estimated blood loss: none.      [facility]

## 2023-05-19 ENCOUNTER — Encounter: Payer: Self-pay | Admitting: Gastroenterology

## 2023-05-19 ENCOUNTER — Ambulatory Visit: Payer: 59 | Admitting: Nurse Practitioner

## 2023-05-19 VITALS — BP 112/86 | HR 60 | Temp 98.6°F | Resp 16 | Ht 61.0 in | Wt 83.0 lb

## 2023-05-19 DIAGNOSIS — R634 Abnormal weight loss: Secondary | ICD-10-CM | POA: Diagnosis not present

## 2023-05-19 DIAGNOSIS — F411 Generalized anxiety disorder: Secondary | ICD-10-CM

## 2023-05-19 DIAGNOSIS — R630 Anorexia: Secondary | ICD-10-CM | POA: Diagnosis not present

## 2023-05-19 DIAGNOSIS — A0472 Enterocolitis due to Clostridium difficile, not specified as recurrent: Secondary | ICD-10-CM | POA: Diagnosis not present

## 2023-05-19 LAB — SURGICAL PATHOLOGY

## 2023-05-19 NOTE — Progress Notes (Signed)
[facility] [address] [place], [place] [address]  Internal MEDICINE  Office Visit Note  Patient Name: Mariah Bird  [id number]  [id number]  Date of Service: 07/09/2023  Chief Complaint  Patient presents with   Depression   Gastroesophageal Reflux   Follow-up    HPI Mariah Bird presents for a follow-up visit for unexplained weight loss, C.diff infection and gastritis.  C.diff treated with fidaxomicin  Had endoscopy done -- chronic gastritis  No further answers from GI, looking into protein malabsorption issues and genetic testing related to this.    Current Medication: Outpatient Encounter Medications as of 05/19/2023  Medication Sig   citalopram (CELEXA) 20 MG tablet Take 20 mg by mouth daily.   cyclobenzaprine (FLEXERIL) 10 MG tablet Take 10 mg by mouth at bedtime.   diphenoxylate-atropine (LOMOTIL) 2.5-0.025 MG tablet Take 1 tablet by mouth 4 (four) times daily as needed for diarrhea or loose stools.   dronabinol (MARINOL) 5 MG capsule Take 5 mg by mouth daily before lunch.   escitalopram (LEXAPRO) 20 MG tablet Take 20 mg by mouth daily.   fidaxomicin (DIFICID) 200 MG TABS tablet Take 1 tablet (200 mg total) by mouth 2 (two) times daily.   hydrOXYzine (VISTARIL) 25 MG capsule Take 25 mg by mouth daily.   ketorolac (TORADOL) 10 MG tablet Take 10 mg by mouth every 6 (six) hours as needed.   levocetirizine (XYZAL) 5 MG tablet Take 5 mg by mouth every evening.   lidocaine (XYLOCAINE) 5 % ointment Apply 1 Application topically as needed.   loperamide (IMODIUM) 2 MG capsule Take 1 capsule (2 mg total) by mouth 4 (four) times daily as needed for diarrhea or loose stools.   megestrol (MEGACE) 40 MG tablet Take 2 tablets (80 mg total) by mouth 2 (two) times daily.   mirtazapine (REMERON) 30 MG tablet Take 1 tablet (30 mg total) by mouth at bedtime.   montelukast (SINGULAIR) 10 MG tablet Take 10 mg by mouth at bedtime.   mupirocin ointment (BACTROBAN) 2 % Apply 1  Application topically daily. Buttock s/p biopsy   norethindrone-ethinyl estradiol (CYCLAFEM) 0.5/0.75/1-35 MG-MCG tablet Take 1 tablet by mouth daily.   ondansetron (ZOFRAN-ODT) 4 MG disintegrating tablet Take 1 tablet (4 mg total) by mouth every 8 (eight) hours as needed for nausea or vomiting.   oxybutynin (DITROPAN) 5 MG tablet Take 1 tablet (5 mg total) by mouth every 8 (eight) hours as needed for bladder spasms.   phenazopyridine (PYRIDIUM) 200 MG tablet Take 1 tablet (200 mg total) by mouth 3 (three) times daily as needed (for pain with urination).   promethazine (PHENERGAN) 12.5 MG tablet Take 1 tablet (12.5 mg total) by mouth every 6 (six) hours as needed for nausea or vomiting.   Sodium Sulfate-Mag Sulfate-KCl (SUTAB) [phone] MG TABS At 5 PM take 12 tablets using the 8 oz cup provided in the kit drinking 5 cups of water and 5 hours before your procedure repeat the same process.   sucralfate (CARAFATE) 1 g tablet Take 1 g by mouth 3 (three) times daily as needed.   triamcinolone ointment (KENALOG) 0.1 % Apply twice daily to affected areas as needed for rash/itching. Avoid applying to face, groin, and axilla.   Vitamin D, Ergocalciferol, (DRISDOL) 1.25 MG (50000 UNIT) CAPS capsule Take 1 capsule (50,000 Units total) by mouth every 7 (seven) days.   pantoprazole (PROTONIX) 40 MG tablet Take 1 tablet (40 mg total) by mouth daily.   No facility-administered encounter medications on file  as of 05/19/2023.    Surgical History: Past Surgical History:  Procedure Laterality Date   COLONOSCOPY WITH ESOPHAGOGASTRODUODENOSCOPY (EGD)  05/2022   COLONOSCOPY WITH PROPOFOL N/A 05/18/2023   Procedure: COLONOSCOPY WITH PROPOFOL;  Surgeon: [name], MD;  Location: [facility] ENDOSCOPY;  Service: Gastroenterology;  Laterality: N/A;   CYSTOSCOPY/URETEROSCOPY/HOLMIUM LASER/STENT PLACEMENT Right 10/21/2022   Procedure: CYSTOSCOPY RIGHT URETEROSCOPY, RETROGRADE PYELOGRAM, STONE BASKETTING, AND RIGHT URTERAL  STENT PLACEMENT;  Surgeon: Rene Paci, MD;  Location: [facility];  Service: Urology;  Laterality: Right;   ESOPHAGOGASTRODUODENOSCOPY (EGD) WITH PROPOFOL N/A 05/18/2023   Procedure: ESOPHAGOGASTRODUODENOSCOPY (EGD) WITH PROPOFOL;  Surgeon: [name], MD;  Location: [facility] ENDOSCOPY;  Service: Gastroenterology;  Laterality: N/A;   KNEE ARTHROSCOPY Right    age 37   NASAL SINUS SURGERY  12/18/2016    Medical History: Past Medical History:  Diagnosis Date   Anxiety    Depression    Family history of adverse reaction to anesthesia    mother--- ponv   GERD (gastroesophageal reflux disease)    History of gastritis 05/2022   History of kidney stones    Hypothyroidism    10-20-2022  per pt currently no meds ,followed by pcp   Lower urinary tract symptoms (LUTS)    Nausea and vomiting 03/16/2023   Right ureteral calculus    Wears glasses     Family History: Family History  Problem Relation Age of Onset   Lupus Sister    Kidney disease Maternal Uncle    Kidney Stones Paternal Grandmother    Bladder Cancer Neg Hx    Kidney cancer Neg Hx    Prostate cancer Neg Hx     Social History   Socioeconomic History   Marital status: Single    Spouse name: Not on file   Number of children: Not on file   Years of education: Not on file   Highest education level: Not on file  Occupational History   Not on file  Tobacco Use   Smoking status: Never   Smokeless tobacco: Never  Vaping Use   Vaping status: Never Used  Substance and Sexual Activity   Alcohol use: Not Currently    Comment: 10-20-2022 stopped 2 wks ago   Drug use: Not Currently    Types: Marijuana    Comment: 10-20-2022 per pt last smoked marijuna 2 wks ago   Sexual activity: Not Currently    Birth control/protection: None  Other Topics Concern   Not on file  Social History Narrative   Lives alone   Social Determinants of Health   Financial Resource Strain: Not on file  Food Insecurity:  Not on file  Transportation Needs: Not on file  Physical Activity: Not on file  Stress: Not on file  Social Connections: Not on file  Intimate Partner Violence: Not on file      Review of Systems  Constitutional:  Positive for appetite change (continued decreased/lack of appetite.  smoking marijuana helps as well as taking mirtazapine.) and unexpected weight change (back up to 90 lbs, BMI 17.04). Negative for chills, fatigue and fever.  HENT: Negative.  Negative for dental problem.        Tender on neck where tonsils are, often has swollen tonsils and tonsil stones.  Eyes: Negative.   Respiratory: Negative.  Negative for cough, chest tightness, shortness of breath and wheezing.   Cardiovascular: Negative.  Negative for chest pain and palpitations.  Gastrointestinal:  Positive for abdominal distention, diarrhea and nausea. Negative for  abdominal pain and rectal pain.       Increased frequency and variable consistency of stool  Endocrine:       Temp fluctuations, hair loss, weight loss, need to order labs to rule out hormone abnormalities  Genitourinary:  Negative for difficulty urinating, dysuria, enuresis, flank pain, frequency, hematuria, menstrual problem, pelvic pain, vaginal bleeding, vaginal discharge and vaginal pain.  Musculoskeletal:  Positive for myalgias.  Skin: Negative.   Allergic/Immunologic: Negative for immunocompromised state.  Neurological:  Positive for weakness (muscle weakness, body ache). Negative for headaches.  Hematological:  Bruises/bleeds easily.  Psychiatric/Behavioral:  Positive for sleep disturbance. Negative for behavioral problems, self-injury and suicidal ideas. The patient is nervous/anxious (expected due to current medical problem).     Vital Signs: BP 112/86   Pulse 60   Temp 98.6 F (37 C)   Resp 16   Ht 5\' 1"  (1.549 m)   Wt 83 lb (37.6 kg)   LMP 05/17/2023 Comment: patient states started period on 05/17/23; also is not sexually active with  anyone at this time  SpO2 99%   BMI 15.68 kg/m    Physical Exam Vitals reviewed.  Constitutional:      General: She is not in acute distress.    Appearance: Normal appearance. She is underweight. She is not ill-appearing.  HENT:     Head: Normocephalic and atraumatic.  Eyes:     Pupils: Pupils are equal, round, and reactive to light.  Cardiovascular:     Rate and Rhythm: Normal rate and regular rhythm.  Pulmonary:     Effort: Pulmonary effort is normal. No respiratory distress.  Neurological:     Mental Status: She is alert and oriented to person, place, and time.  Psychiatric:        Mood and Affect: Mood normal.        Behavior: Behavior normal.        Assessment/Plan: 1. Unexplained weight loss Still not able to gain weight, concern for some sort of protein or nutrient malabsorption issue  2. Decreased appetite Improved, has been drinking protein shakes for extra nutrients   3. C. difficile diarrhea Treated for this by GI  4. GAD (generalized anxiety disorder) Anxiety is up related to her GI issues and stress and drama at work.    General Counseling: lachlan broll understanding of the findings of todays visit and agrees with plan of treatment. I have discussed any further diagnostic evaluation that may be needed or ordered today. We also reviewed her medications today. she has been encouraged to call the office with any questions or concerns that should arise related to todays visit.    No orders of the defined types were placed in this encounter.   No orders of the defined types were placed in this encounter.   Return in about 4 weeks (around 06/16/2023) for F/U, Review labs/test, Amerika Nourse PCP.   Total time spent:30 Minutes Time spent includes review of chart, medications, test results, and follow up plan with the patient.   [place] Controlled Substance Database was reviewed by me.  This patient was seen by Sallyanne Kuster, FNP-C in collaboration with  Dr. Beverely Risen as a part of collaborative care agreement.   Lilibeth Opie R. Tedd Sias, MSN, FNP-C Internal medicine

## 2023-05-25 ENCOUNTER — Ambulatory Visit: Payer: 59 | Admitting: Nurse Practitioner

## 2023-06-03 ENCOUNTER — Encounter: Payer: Self-pay | Admitting: Gastroenterology

## 2023-06-03 ENCOUNTER — Ambulatory Visit: Payer: 59 | Admitting: Gastroenterology

## 2023-06-03 VITALS — BP 132/90 | HR 80 | Temp 97.8°F | Wt 86.6 lb

## 2023-06-03 DIAGNOSIS — R197 Diarrhea, unspecified: Secondary | ICD-10-CM

## 2023-06-03 DIAGNOSIS — Z8639 Personal history of other endocrine, nutritional and metabolic disease: Secondary | ICD-10-CM

## 2023-06-03 DIAGNOSIS — R634 Abnormal weight loss: Secondary | ICD-10-CM

## 2023-06-03 DIAGNOSIS — K580 Irritable bowel syndrome with diarrhea: Secondary | ICD-10-CM

## 2023-06-03 DIAGNOSIS — R630 Anorexia: Secondary | ICD-10-CM | POA: Diagnosis not present

## 2023-06-03 NOTE — Patient Instructions (Signed)
Please arrive 2 hours prior to your CT Scan and nothing to eat or drink before your CT Scan. Please call [phone] if you need to reschedule your CT Scan.

## 2023-06-03 NOTE — Progress Notes (Signed)
[name] MD, MRCP(U.K) [address]  [address]  [place], [place] [address]  Main: [phone]  Fax: [phone]   Primary Care Physician: Sallyanne Kuster, NP  Primary Gastroenterologist:  Dr. [name]   Chief Complaint  Patient presents with   Diarrhea    HPI: Mariah Bird is a 37 y.o. female    Summary of history :   Last seen back in July 2024 for abnormal weight loss decreased appetite diarrhea and abdominal pain.She has lost 30- lbs 40lbsweight over the past few months, decreased appetite. Profuse diarrhea multiple bowel movements a day lots of mucus no blood denies any NSAID use. No vomiting. Nausea on and off. Smokes marijuana. No cigarette smoking. No clear inability to eat certain foods. Has been told she may have Hashimoto's but is unsure. She does not recollect has had a full evaluation yet concerned about the weight loss.    Interval history 03/16/2023 -06/03/2023 03/17/2023: GI PCR stool C. difficile positive, Arna Medici virus positive treated with Dificid.  H. pylori breath test negative CRP normal alpha gal panel normal in June 2024 HIV negative  05/18/2023: EGD: Gastritis confirmed on biopsy no H. pylori normal appearance of the duodenum biopsied showed no evidence of celiac disease.  Colonoscopy was performed on the same day normal appearance of the colon.  Random colon biopsy showed no abnormality. On 05/19/2023 she weighed 83 pounds.  In July 2024 she weighed 91 pounds.  Continues to have loose stools on and off.  She says she gains weight loses weight.  She was told at some point she had Hashimoto's.  Appetite is poor.  Current Outpatient Medications  Medication Sig Dispense Refill   citalopram (CELEXA) 20 MG tablet Take 20 mg by mouth daily.     cyclobenzaprine (FLEXERIL) 10 MG tablet Take 10 mg by mouth at bedtime.     diphenoxylate-atropine (LOMOTIL) 2.5-0.025 MG tablet Take 1 tablet by mouth 4 (four) times daily as needed for diarrhea or loose  stools. 30 tablet 0   dronabinol (MARINOL) 5 MG capsule Take 5 mg by mouth daily before lunch.     escitalopram (LEXAPRO) 20 MG tablet Take 20 mg by mouth daily.     fidaxomicin (DIFICID) 200 MG TABS tablet Take 1 tablet (200 mg total) by mouth 2 (two) times daily. 20 tablet 0   hydrOXYzine (VISTARIL) 25 MG capsule Take 25 mg by mouth daily.     ketorolac (TORADOL) 10 MG tablet Take 10 mg by mouth every 6 (six) hours as needed.     levocetirizine (XYZAL) 5 MG tablet Take 5 mg by mouth every evening.     lidocaine (XYLOCAINE) 5 % ointment Apply 1 Application topically as needed. 30 g 0   loperamide (IMODIUM) 2 MG capsule Take 1 capsule (2 mg total) by mouth 4 (four) times daily as needed for diarrhea or loose stools. 12 capsule 0   megestrol (MEGACE) 40 MG tablet Take 2 tablets (80 mg total) by mouth 2 (two) times daily. 120 tablet 5   mirtazapine (REMERON) 30 MG tablet Take 1 tablet (30 mg total) by mouth at bedtime. 30 tablet 5   montelukast (SINGULAIR) 10 MG tablet Take 10 mg by mouth at bedtime.     mupirocin ointment (BACTROBAN) 2 % Apply 1 Application topically daily. Buttock s/p biopsy 30 g 0   norethindrone-ethinyl estradiol (CYCLAFEM) 0.5/0.75/1-35 MG-MCG tablet Take 1 tablet by mouth daily.     ondansetron (ZOFRAN-ODT) 4 MG disintegrating tablet Take 1  tablet (4 mg total) by mouth every 8 (eight) hours as needed for nausea or vomiting. 20 tablet 0   oxybutynin (DITROPAN) 5 MG tablet Take 1 tablet (5 mg total) by mouth every 8 (eight) hours as needed for bladder spasms. 30 tablet 1   phenazopyridine (PYRIDIUM) 200 MG tablet Take 1 tablet (200 mg total) by mouth 3 (three) times daily as needed (for pain with urination). 30 tablet 0   promethazine (PHENERGAN) 12.5 MG tablet Take 1 tablet (12.5 mg total) by mouth every 6 (six) hours as needed for nausea or vomiting. 30 tablet 0   Sodium Sulfate-Mag Sulfate-KCl (SUTAB) [phone] MG TABS At 5 PM take 12 tablets using the 8 oz cup provided in  the kit drinking 5 cups of water and 5 hours before your procedure repeat the same process. 24 tablet 0   sucralfate (CARAFATE) 1 g tablet Take 1 g by mouth 3 (three) times daily as needed.     triamcinolone ointment (KENALOG) 0.1 % Apply twice daily to affected areas as needed for rash/itching. Avoid applying to face, groin, and axilla. 30 g 2   Vitamin D, Ergocalciferol, (DRISDOL) 1.25 MG (50000 UNIT) CAPS capsule Take 1 capsule (50,000 Units total) by mouth every 7 (seven) days. 15 capsule 1   pantoprazole (PROTONIX) 40 MG tablet Take 1 tablet (40 mg total) by mouth daily. 30 tablet 0   No current facility-administered medications for this visit.    Allergies as of 06/03/2023   (No Known Allergies)      ROS:  General: Negative for anorexia, weight loss, fever, chills, fatigue, weakness. ENT: Negative for hoarseness, difficulty swallowing , nasal congestion. CV: Negative for chest pain, angina, palpitations, dyspnea on exertion, peripheral edema.  Respiratory: Negative for dyspnea at rest, dyspnea on exertion, cough, sputum, wheezing.  GI: See history of present illness. GU:  Negative for dysuria, hematuria, urinary incontinence, urinary frequency, nocturnal urination.  Endo: Negative for unusual weight change.    Physical Examination:   BP (!) 132/90   Pulse 80   Temp 97.8 F (36.6 C) (Oral)   Wt 86 lb 9.6 oz (39.3 kg)   LMP 05/17/2023 Comment: patient states started period on 05/17/23; also is not sexually active with anyone at this time  BMI 16.36 kg/m   General: Very thin and cachectic Eyes: No icterus. Conjunctivae pink. Mouth: Oropharyngeal mucosa moist and pink , no lesions erythema or exudate. Lungs: Clear to auscultation bilaterally. Non-labored. Heart: Regular rate and rhythm, no murmurs rubs or gallops.  Abdomen: Bowel sounds are normal, nontender, nondistended, no hepatosplenomegaly or masses, no abdominal bruits or hernia , no rebound or guarding.    Extremities: No lower extremity edema. No clubbing or deformities. Neuro: Alert and oriented x 3.  Grossly intact. Skin: Warm and dry, no jaundice.   Psych: Alert and cooperative, normal mood and affect.   Imaging Studies: No results found.  Assessment and Plan:   Mariah Bird is a 37 y.o. 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  Concerning presently is her.  Unintentional weight loss poor appetite.  History of Hashimoto's.  Evaluation from the GI point of view including EGD colonoscopy have been normal.  Celiac disease has been ruled out.     Plan  ESR CRP to rule out any rheumatological disorders if elevated will perform ENA panel Check TSH Probiotics for postinfectious irritable bowel syndrome CT chest abdomen pelvis for unintentional weight loss  Dr [name]  MD,MRCP [facility]) Follow up in 4 weeks with Celso Amy

## 2023-06-04 ENCOUNTER — Ambulatory Visit
Admission: RE | Admit: 2023-06-04 | Discharge: 2023-06-04 | Disposition: A | Discharge status: Home / Self Care | Payer: 59 | Source: Ambulatory Visit | Attending: Gastroenterology

## 2023-06-04 DIAGNOSIS — R634 Abnormal weight loss: Secondary | ICD-10-CM | POA: Insufficient documentation

## 2023-06-04 LAB — TSH: TSH: 1.36 u[IU]/mL (ref 0.450–4.500)

## 2023-06-04 LAB — C-REACTIVE PROTEIN: CRP: <1 mg/L (ref 0–10)

## 2023-06-04 LAB — SEDIMENTATION RATE: Sed Rate: 2 mm/h (ref 0–32)

## 2023-06-04 MED ORDER — IOHEXOL 300 MG/ML  SOLN
75.0000 mL | Freq: Once | INTRAMUSCULAR | Status: AC | PRN
  Administered 2023-06-04: 75 mL via INTRAVENOUS

## 2023-06-10 ENCOUNTER — Encounter: Payer: Self-pay | Admitting: Nurse Practitioner

## 2023-06-10 ENCOUNTER — Telehealth: Payer: Self-pay | Admitting: Nurse Practitioner

## 2023-06-10 NOTE — Telephone Encounter (Signed)
Received FMLA paperwork from [employer]. Gave to Alyssa to completed-Toni

## 2023-06-14 NOTE — Telephone Encounter (Signed)
Pt had appt Wednesday discuss at visit

## 2023-06-15 ENCOUNTER — Telehealth: Payer: Self-pay | Admitting: Nurse Practitioner

## 2023-06-15 NOTE — Telephone Encounter (Signed)
MR faxed to [employer]; [phone]

## 2023-06-16 ENCOUNTER — Ambulatory Visit: Payer: 59 | Admitting: Nurse Practitioner

## 2023-06-16 ENCOUNTER — Encounter: Payer: Self-pay | Admitting: Nurse Practitioner

## 2023-06-16 VITALS — BP 110/80 | HR 68 | Temp 98.8°F | Resp 16 | Ht 61.0 in | Wt 86.0 lb

## 2023-06-16 DIAGNOSIS — A0472 Enterocolitis due to Clostridium difficile, not specified as recurrent: Secondary | ICD-10-CM

## 2023-06-16 DIAGNOSIS — R634 Abnormal weight loss: Secondary | ICD-10-CM | POA: Diagnosis not present

## 2023-06-16 DIAGNOSIS — R630 Anorexia: Secondary | ICD-10-CM | POA: Diagnosis not present

## 2023-06-16 DIAGNOSIS — R946 Abnormal results of thyroid function studies: Secondary | ICD-10-CM

## 2023-06-16 DIAGNOSIS — R7301 Impaired fasting glucose: Secondary | ICD-10-CM | POA: Diagnosis not present

## 2023-06-16 DIAGNOSIS — F411 Generalized anxiety disorder: Secondary | ICD-10-CM

## 2023-06-16 NOTE — Progress Notes (Cosign Needed)
[facility] [address] [place], [place] [address]  Internal MEDICINE  Office Visit Note  Patient Name: Mariah Bird  [id number]  [id number]  Date of Service: 06/16/2023  Chief Complaint  Patient presents with   Depression   Gastroesophageal Reflux   Follow-up    HPI Mariah Bird presents for a follow-up visit for unexplained weight loss, chronic gastritis, and GERD.  Patient states her specialist said that they did not see any signs of malabsorption issues during her endoscopy.  She was found to have chronic gastritis and a C.diff infection which was treated outpatient.  She has gained about 3 lbs since August but her weight has been fluctuating between 78-88 lbs for the past year or more. Her BMI is low, underweight at 16.25.  Reports chronic bloating and feels like she is swollen in a generalized manner, has easy bruising She is having periods, she was slightly anemic in February this year but otherwise, her CBC labs have been normal before and after that time.  Inflammatory markers have been negative, celiac panel and alpha-gal panel are negative, H. Pylori breath test was negative.  All thyroid labs were normal except for a slightly elevated TPO Ab.  Had some slightly elevated glucose labs over the past couple years that may have been fasting, last A1c is 4.9 from 2022.  Labs related to protein are normal or elevated and have not been low which is not consistent with having protein malabsorption issues.    Current Medication: Outpatient Encounter Medications as of 06/16/2023  Medication Sig   citalopram (CELEXA) 20 MG tablet Take 20 mg by mouth daily.   cyclobenzaprine (FLEXERIL) 10 MG tablet Take 10 mg by mouth at bedtime.   diphenoxylate-atropine (LOMOTIL) 2.5-0.025 MG tablet Take 1 tablet by mouth 4 (four) times daily as needed for diarrhea or loose stools.   dronabinol (MARINOL) 5 MG capsule Take 5 mg by mouth daily before lunch.   escitalopram (LEXAPRO) 20  MG tablet Take 20 mg by mouth daily.   fidaxomicin (DIFICID) 200 MG TABS tablet Take 1 tablet (200 mg total) by mouth 2 (two) times daily.   hydrOXYzine (VISTARIL) 25 MG capsule Take 25 mg by mouth daily.   ketorolac (TORADOL) 10 MG tablet Take 10 mg by mouth every 6 (six) hours as needed.   levocetirizine (XYZAL) 5 MG tablet Take 5 mg by mouth every evening.   lidocaine (XYLOCAINE) 5 % ointment Apply 1 Application topically as needed.   loperamide (IMODIUM) 2 MG capsule Take 1 capsule (2 mg total) by mouth 4 (four) times daily as needed for diarrhea or loose stools.   megestrol (MEGACE) 40 MG tablet Take 2 tablets (80 mg total) by mouth 2 (two) times daily.   mirtazapine (REMERON) 30 MG tablet Take 1 tablet (30 mg total) by mouth at bedtime.   montelukast (SINGULAIR) 10 MG tablet Take 10 mg by mouth at bedtime.   mupirocin ointment (BACTROBAN) 2 % Apply 1 Application topically daily. Buttock s/p biopsy   norethindrone-ethinyl estradiol (CYCLAFEM) 0.5/0.75/1-35 MG-MCG tablet Take 1 tablet by mouth daily.   ondansetron (ZOFRAN-ODT) 4 MG disintegrating tablet Take 1 tablet (4 mg total) by mouth every 8 (eight) hours as needed for nausea or vomiting.   oxybutynin (DITROPAN) 5 MG tablet Take 1 tablet (5 mg total) by mouth every 8 (eight) hours as needed for bladder spasms.   phenazopyridine (PYRIDIUM) 200 MG tablet Take 1 tablet (200 mg total) by mouth 3 (three) times daily as needed (for  pain with urination).   promethazine (PHENERGAN) 12.5 MG tablet Take 1 tablet (12.5 mg total) by mouth every 6 (six) hours as needed for nausea or vomiting.   Sodium Sulfate-Mag Sulfate-KCl (SUTAB) [phone] MG TABS At 5 PM take 12 tablets using the 8 oz cup provided in the kit drinking 5 cups of water and 5 hours before your procedure repeat the same process.   sucralfate (CARAFATE) 1 g tablet Take 1 g by mouth 3 (three) times daily as needed.   triamcinolone ointment (KENALOG) 0.1 % Apply twice daily to affected  areas as needed for rash/itching. Avoid applying to face, groin, and axilla.   Vitamin D, Ergocalciferol, (DRISDOL) 1.25 MG (50000 UNIT) CAPS capsule Take 1 capsule (50,000 Units total) by mouth every 7 (seven) days.   pantoprazole (PROTONIX) 40 MG tablet Take 1 tablet (40 mg total) by mouth daily.   No facility-administered encounter medications on file as of 06/16/2023.    Surgical History: Past Surgical History:  Procedure Laterality Date   COLONOSCOPY WITH ESOPHAGOGASTRODUODENOSCOPY (EGD)  05/2022   COLONOSCOPY WITH PROPOFOL N/A 05/18/2023   Procedure: COLONOSCOPY WITH PROPOFOL;  Surgeon: [name], MD;  Location: [facility] ENDOSCOPY;  Service: Gastroenterology;  Laterality: N/A;   CYSTOSCOPY/URETEROSCOPY/HOLMIUM LASER/STENT PLACEMENT Right 10/21/2022   Procedure: CYSTOSCOPY RIGHT URETEROSCOPY, RETROGRADE PYELOGRAM, STONE BASKETTING, AND RIGHT URTERAL STENT PLACEMENT;  Surgeon: Rene Paci, MD;  Location: [facility];  Service: Urology;  Laterality: Right;   ESOPHAGOGASTRODUODENOSCOPY (EGD) WITH PROPOFOL N/A 05/18/2023   Procedure: ESOPHAGOGASTRODUODENOSCOPY (EGD) WITH PROPOFOL;  Surgeon: [name], MD;  Location: [facility] ENDOSCOPY;  Service: Gastroenterology;  Laterality: N/A;   KNEE ARTHROSCOPY Right    age 37   NASAL SINUS SURGERY  12/18/2016    Medical History: Past Medical History:  Diagnosis Date   Anxiety    Depression    Family history of adverse reaction to anesthesia    mother--- ponv   GERD (gastroesophageal reflux disease)    History of gastritis 05/2022   History of kidney stones    Hypothyroidism    10-20-2022  per pt currently no meds ,followed by pcp   Lower urinary tract symptoms (LUTS)    Nausea and vomiting 03/16/2023   Right ureteral calculus    Wears glasses     Family History: Family History  Problem Relation Age of Onset   Lupus Sister    Kidney disease Maternal Uncle    Kidney Stones Paternal Grandmother    Bladder Cancer  Neg Hx    Kidney cancer Neg Hx    Prostate cancer Neg Hx     Social History   Socioeconomic History   Marital status: Single    Spouse name: Not on file   Number of children: Not on file   Years of education: Not on file   Highest education level: Not on file  Occupational History   Not on file  Tobacco Use   Smoking status: Never   Smokeless tobacco: Never  Vaping Use   Vaping status: Never Used  Substance and Sexual Activity   Alcohol use: Not Currently    Comment: 10-20-2022 stopped 2 wks ago   Drug use: Not Currently    Types: Marijuana    Comment: 10-20-2022 per pt last smoked marijuna 2 wks ago   Sexual activity: Not Currently    Birth control/protection: None  Other Topics Concern   Not on file  Social History Narrative   Lives alone   Social Drivers of Health  Financial Resource Strain: Not on file  Food Insecurity: Not on file  Transportation Needs: Not on file  Physical Activity: Not on file  Stress: Not on file  Social Connections: Not on file  Intimate Partner Violence: Not on file      Review of Systems  Constitutional:  Positive for appetite change (continued decreased/lack of appetite.  smoking marijuana helps as well as taking mirtazapine.) and unexpected weight change (back up to 90 lbs, BMI 17.04). Negative for chills, fatigue and fever.  HENT: Negative.  Negative for dental problem.        Tender on neck where tonsils are, often has swollen tonsils and tonsil stones.  Eyes: Negative.   Respiratory: Negative.  Negative for cough, chest tightness, shortness of breath and wheezing.   Cardiovascular: Negative.  Negative for chest pain and palpitations.  Gastrointestinal:  Positive for abdominal distention, diarrhea and nausea. Negative for abdominal pain and rectal pain.       Increased frequency and variable consistency of stool  Endocrine:       Temp fluctuations, hair loss, weight loss, need to order labs to rule out hormone abnormalities   Genitourinary:  Negative for difficulty urinating, dysuria, enuresis, flank pain, frequency, hematuria, menstrual problem, pelvic pain, vaginal bleeding, vaginal discharge and vaginal pain.  Musculoskeletal:  Positive for myalgias.  Skin: Negative.   Allergic/Immunologic: Negative for immunocompromised state.  Neurological:  Positive for weakness (muscle weakness, body ache). Negative for headaches.  Hematological:  Bruises/bleeds easily.  Psychiatric/Behavioral:  Positive for sleep disturbance. Negative for behavioral problems, self-injury and suicidal ideas. The patient is nervous/anxious (expected due to current medical problem).     Vital Signs: BP 110/80   Pulse 68   Temp 98.8 F (37.1 C)   Resp 16   Ht 5\' 1"  (1.549 m)   Wt 86 lb (39 kg)   LMP 05/18/2023 Comment: patient states started period on 05/17/23; also is not sexually active with anyone at this time  SpO2 97%   BMI 16.25 kg/m    Physical Exam Vitals reviewed.  Constitutional:      General: She is not in acute distress.    Appearance: Normal appearance. She is underweight. She is not ill-appearing.  HENT:     Head: Normocephalic and atraumatic.  Eyes:     Pupils: Pupils are equal, round, and reactive to light.  Cardiovascular:     Rate and Rhythm: Normal rate and regular rhythm.  Pulmonary:     Effort: Pulmonary effort is normal. No respiratory distress.  Neurological:     Mental Status: She is alert and oriented to person, place, and time.  Psychiatric:        Mood and Affect: Mood normal.        Behavior: Behavior normal.        Assessment/Plan: 1. C. difficile diarrhea (Primary) Repeat and additional labs ordered for further evaluation - Fecal fat, qualitative - Pancreatic elastase, fecal - Cancer antigen 19-9  2. Unexplained weight loss Repeat and additional labs ordered for further evaluation - Hgb A1C w/o eAG - CBC with Differential/Platelet - CMP14+EGFR - Cortisol-am, blood - Fecal fat,  qualitative - Pancreatic elastase, fecal - Lipase - TSH+T4F+T3Free+ThyAbs+TPO+VD25 - CA 125 - Cancer antigen 19-9 - Beta HCG, Quant - Calcitonin  3. Decreased appetite Repeat and additional labs ordered for further evaluation - Hgb A1C w/o eAG - CBC with Differential/Platelet - CMP14+EGFR - Cortisol-am, blood - Fecal fat, qualitative - Pancreatic elastase, fecal - Lipase - TSH+T4F+T3Free+ThyAbs+TPO+VD25 -  CA 125 - Cancer antigen 19-9 - Beta HCG, Quant - Calcitonin  4. Impaired fasting glucose Repeat and additional labs ordered for further evaluation - Hgb A1C w/o eAG - CMP14+EGFR - Lipase - TSH+T4F+T3Free+ThyAbs+TPO+VD25 - Cancer antigen 19-9 - Beta HCG, Quant  5. Thyroid function test abnormal Repeat and additional labs ordered for further evaluation - Hgb A1C w/o eAG - CMP14+EGFR - Lipase - TSH+T4F+T3Free+ThyAbs+TPO+VD25 - Calcitonin  6. GAD (generalized anxiety disorder) Increased anxiety over unexplained weight loss, poor appetite and GI issues, without having a definitive cause identified.    General Counseling: iiesha emig understanding of the findings of todays visit and agrees with plan of treatment. I have discussed any further diagnostic evaluation that may be needed or ordered today. We also reviewed her medications today. she has been encouraged to call the office with any questions or concerns that should arise related to todays visit.    Orders Placed This Encounter  Procedures   Hgb A1C w/o eAG   CBC with Differential/Platelet   CMP14+EGFR   Cortisol-am, blood   Fecal fat, qualitative   Pancreatic elastase, fecal   Lipase   TSH+T4F+T3Free+ThyAbs+TPO+VD25   CA 125   Cancer antigen 19-9   Beta HCG, Quant   Calcitonin    No orders of the defined types were placed in this encounter.   Return in about 2 months (around 08/16/2023) for F/U, Christi Wirick PCP.   Total time spent:30 Minutes Time spent includes review of chart,  medications, test results, and follow up plan with the patient.   [place] Controlled Substance Database was reviewed by me.  This patient was seen by Sallyanne Kuster, FNP-C in collaboration with Dr. Beverely Risen as a part of collaborative care agreement.   Teirra Carapia R. Tedd Sias, MSN, FNP-C Internal medicine

## 2023-06-17 ENCOUNTER — Telehealth: Payer: Self-pay | Admitting: Nurse Practitioner

## 2023-06-17 ENCOUNTER — Telehealth: Payer: Self-pay | Admitting: Gastroenterology

## 2023-06-17 ENCOUNTER — Telehealth: Payer: Self-pay

## 2023-06-17 NOTE — Telephone Encounter (Signed)
PT requesting call back when CT results are released

## 2023-06-17 NOTE — Telephone Encounter (Signed)
Work note emailed to patient-Mariah Bird 

## 2023-06-18 NOTE — Telephone Encounter (Signed)
[facility] Radiology department and asked to be transferred to the CT department. I then spoke to them and asked if they knew when patient's CT would be read and I was told to call the radiology department in [place] at [phone] and ask them. I then called the above number and they stated that they would put it to be read with one of the radiologist but to excuse them but that they were very behind. Dr. Tobi Bastos, please let me know what you would like for me to tell the patient. Thank you.

## 2023-06-21 NOTE — Telephone Encounter (Signed)
error 

## 2023-06-22 ENCOUNTER — Encounter: Payer: Self-pay | Admitting: Nurse Practitioner

## 2023-06-24 DIAGNOSIS — J3501 Chronic tonsillitis: Secondary | ICD-10-CM

## 2023-06-24 DIAGNOSIS — J358 Other chronic diseases of tonsils and adenoids: Secondary | ICD-10-CM

## 2023-06-24 HISTORY — DX: Other chronic diseases of tonsils and adenoids: J35.8

## 2023-06-24 HISTORY — DX: Chronic tonsillitis: J35.01

## 2023-06-25 ENCOUNTER — Telehealth: Payer: Self-pay | Admitting: Nurse Practitioner

## 2023-06-25 NOTE — Telephone Encounter (Signed)
Completed fmla faxed back to [employer]; [phone]. Scanned. Notified patient via Risk manager

## 2023-06-29 ENCOUNTER — Telehealth: Payer: Self-pay | Admitting: Nurse Practitioner

## 2023-06-29 NOTE — Telephone Encounter (Signed)
Updated return date for FMLA completed. Faxed back to [employer]; [phone]. Sent to patient via mychart. Scanned-Toni

## 2023-06-29 NOTE — Telephone Encounter (Signed)
Received message from patient, needs return date changed to 10/15/2023. Gave to AA-Toni

## 2023-06-29 NOTE — Telephone Encounter (Signed)
Error

## 2023-07-05 ENCOUNTER — Encounter: Payer: Self-pay | Admitting: Nurse Practitioner

## 2023-07-06 ENCOUNTER — Telehealth: Payer: Self-pay | Admitting: Nurse Practitioner

## 2023-07-06 ENCOUNTER — Other Ambulatory Visit: Payer: Self-pay

## 2023-07-06 ENCOUNTER — Encounter: Payer: Self-pay | Admitting: Nurse Practitioner

## 2023-07-06 DIAGNOSIS — R197 Diarrhea, unspecified: Secondary | ICD-10-CM

## 2023-07-06 NOTE — Telephone Encounter (Signed)
Received additional paperwork from [employer]. Gave to Alyssa to complete-Toni

## 2023-07-08 ENCOUNTER — Telehealth: Payer: Self-pay | Admitting: Nurse Practitioner

## 2023-07-08 NOTE — Telephone Encounter (Signed)
Received more FMLA paperwork from [employer]. Received Serious Health Condition form to be completed. Gave to [employer]

## 2023-07-09 ENCOUNTER — Encounter: Payer: Self-pay | Admitting: Nurse Practitioner

## 2023-07-12 ENCOUNTER — Telehealth: Payer: Self-pay | Admitting: Nurse Practitioner

## 2023-07-12 NOTE — Telephone Encounter (Signed)
02/15/23-current MR faxed to [employer]; [phone]

## 2023-07-23 ENCOUNTER — Telehealth: Payer: Self-pay | Admitting: Nurse Practitioner

## 2023-07-23 NOTE — Telephone Encounter (Signed)
Fmla completed. Emailed to patient. Scanned-Toni

## 2023-07-27 NOTE — Telephone Encounter (Signed)
Mariah Bird is working on her letter

## 2023-08-12 ENCOUNTER — Encounter: Payer: Self-pay | Admitting: Nurse Practitioner

## 2023-08-12 DIAGNOSIS — R630 Anorexia: Secondary | ICD-10-CM | POA: Insufficient documentation

## 2023-08-12 DIAGNOSIS — A0472 Enterocolitis due to Clostridium difficile, not specified as recurrent: Secondary | ICD-10-CM | POA: Insufficient documentation

## 2023-08-16 ENCOUNTER — Telehealth: Payer: Self-pay | Admitting: Nurse Practitioner

## 2023-08-16 ENCOUNTER — Ambulatory Visit: Payer: 59 | Admitting: Nurse Practitioner

## 2023-08-16 NOTE — Telephone Encounter (Signed)
Per request, 05/18/23 to current office notes faxed to [employer]; [phone]

## 2023-08-26 ENCOUNTER — Telehealth: Payer: Self-pay | Admitting: Nurse Practitioner

## 2023-08-26 NOTE — Telephone Encounter (Signed)
 Disability/FMLA completed. Faxed back to [employer]; [phone]. Notified patient-Mariah Bird

## 2023-08-27 LAB — CMP14+EGFR
ALT: 23 [IU]/L (ref 0–32)
AST: 18 [IU]/L (ref 0–40)
Albumin: 4.4 g/dL (ref 3.9–4.9)
Alkaline Phosphatase: 81 [IU]/L (ref 44–121)
BUN/Creatinine Ratio: 22 (ref 9–23)
BUN: 13 mg/dL (ref 6–20)
Bilirubin Total: 0.3 mg/dL (ref 0.0–1.2)
CO2: 22 mmol/L (ref 20–29)
Calcium: 9.3 mg/dL (ref 8.7–10.2)
Chloride: 105 mmol/L (ref 96–106)
Creatinine, Ser: 0.59 mg/dL (ref 0.57–1.00)
Globulin, Total: 2.8 g/dL (ref 1.5–4.5)
Glucose: 96 mg/dL (ref 70–99)
Potassium: 4.1 mmol/L (ref 3.5–5.2)
Sodium: 140 mmol/L (ref 134–144)
Total Protein: 7.2 g/dL (ref 6.0–8.5)
eGFR: 120 mL/min/{1.73_m2} (ref >59)

## 2023-08-27 LAB — CBC WITH DIFFERENTIAL/PLATELET
Basophils Absolute: 0 10*3/uL (ref 0.0–0.2)
Basos: 1 %
EOS (ABSOLUTE): 0.1 10*3/uL (ref 0.0–0.4)
Eos: 1 %
Hematocrit: 40.8 % (ref 34.0–46.6)
Hemoglobin: 12.9 g/dL (ref 11.1–15.9)
Immature Grans (Abs): 0 10*3/uL (ref 0.0–0.1)
Immature Granulocytes: 0 %
Lymphocytes Absolute: 1.9 10*3/uL (ref 0.7–3.1)
Lymphs: 55 %
MCH: 31.3 pg (ref 26.6–33.0)
MCHC: 31.6 g/dL (ref 31.5–35.7)
MCV: 99 fL — ABNORMAL HIGH (ref 79–97)
Monocytes Absolute: 0.3 10*3/uL (ref 0.1–0.9)
Monocytes: 9 %
Neutrophils Absolute: 1.2 10*3/uL — ABNORMAL LOW (ref 1.4–7.0)
Neutrophils: 34 %
Platelets: 281 10*3/uL (ref 150–450)
RBC: 4.12 x10E6/uL (ref 3.77–5.28)
RDW: 12.1 % (ref 11.7–15.4)
WBC: 3.5 10*3/uL (ref 3.4–10.8)

## 2023-08-27 LAB — HGB A1C W/O EAG: Hgb A1c MFr Bld: 5.1 % (ref 4.8–5.6)

## 2023-08-27 LAB — TSH+T4F+T3FREE+THYABS+TPO+VD25
Free T4: 1.13 ng/dL (ref 0.82–1.77)
T3, Free: 2.9 pg/mL (ref 2.0–4.4)
TSH: 1.38 u[IU]/mL (ref 0.450–4.500)
Thyroglobulin Antibody: <1 [IU]/mL (ref 0.0–0.9)
Thyroperoxidase Ab SerPl-aCnc: 103 [IU]/mL — ABNORMAL HIGH (ref 0–34)
Vit D, 25-Hydroxy: 20.3 ng/mL — ABNORMAL LOW (ref 30.0–100.0)

## 2023-08-27 LAB — CORTISOL-AM, BLOOD: Cortisol - AM: 13.1 ug/dL (ref 6.2–19.4)

## 2023-08-27 LAB — PANCREATIC ELASTASE, FECAL: Pancreatic Elastase, Fecal: >800 ug Elast./g (ref >200)

## 2023-08-27 LAB — CA 125: Cancer Antigen (CA) 125: 4.8 U/mL (ref 0.0–38.1)

## 2023-08-27 LAB — FECAL FAT, QUALITATIVE
Fat Qual Neutral, Stl: NORMAL
Fat Qual Total, Stl: NORMAL

## 2023-08-27 LAB — LIPASE: Lipase: 46 U/L (ref 14–72)

## 2023-08-27 LAB — CANCER ANTIGEN 19-9: CA 19-9: 23 U/mL (ref 0–35)

## 2023-08-27 LAB — BETA HCG QUANT (REF LAB): hCG Quant: <1 m[IU]/mL

## 2023-08-27 LAB — CALCITONIN: Calcitonin: <2 pg/mL (ref 0.0–5.0)

## 2023-08-30 LAB — C DIFFICILE TOXINS A+B W/RFLX: C difficile Toxins A+B, EIA: NEGATIVE

## 2023-08-30 LAB — C DIFFICILE, CYTOTOXIN B

## 2023-08-31 ENCOUNTER — Encounter: Payer: Self-pay | Admitting: Gastroenterology

## 2023-09-06 ENCOUNTER — Ambulatory Visit: Payer: 59 | Admitting: Nurse Practitioner

## 2023-09-06 ENCOUNTER — Encounter: Payer: Self-pay | Admitting: Nurse Practitioner

## 2023-09-06 VITALS — BP 110/70 | HR 86 | Temp 98.3°F | Resp 16 | Ht 61.0 in | Wt 86.2 lb

## 2023-09-06 DIAGNOSIS — R630 Anorexia: Secondary | ICD-10-CM | POA: Diagnosis not present

## 2023-09-06 DIAGNOSIS — R946 Abnormal results of thyroid function studies: Secondary | ICD-10-CM | POA: Diagnosis not present

## 2023-09-06 DIAGNOSIS — R634 Abnormal weight loss: Secondary | ICD-10-CM | POA: Diagnosis not present

## 2023-09-06 DIAGNOSIS — K529 Noninfective gastroenteritis and colitis, unspecified: Secondary | ICD-10-CM | POA: Diagnosis not present

## 2023-09-06 DIAGNOSIS — R718 Other abnormality of red blood cells: Secondary | ICD-10-CM

## 2023-09-06 DIAGNOSIS — R233 Spontaneous ecchymoses: Secondary | ICD-10-CM

## 2023-09-06 MED ORDER — RIFAXIMIN 550 MG PO TABS: 550.0000 mg | ORAL_TABLET | Freq: Three times a day (TID) | ORAL | 0 refills | Status: AC

## 2023-09-06 NOTE — Progress Notes (Signed)
[facility] [address] [place], [place] [address]  Internal MEDICINE  Office Visit Note  Patient Name: Mariah Bird  [id number]  [id number]  Date of Service: 09/06/2023  Chief Complaint  Patient presents with   Depression   Gastroesophageal Reflux   Follow-up    HPI Mariah Bird presents for a follow-up visit for lab results  Most of the labs are normal. Fecal fat and pancreatic elastase are normal. Cortisol level is normal. All tumor markers are normal.  TPO level is increased at 103, but the rest of the thyroid labs are normal. Vitamin D level is low MVC is elevated, patient has easy bruising, has not been evaluated by hematology. Has had unexplained weight loss in the past.     Current Medication: Outpatient Encounter Medications as of 09/06/2023  Medication Sig   citalopram (CELEXA) 20 MG tablet Take 20 mg by mouth daily.   cyclobenzaprine (FLEXERIL) 10 MG tablet Take 10 mg by mouth at bedtime.   diphenoxylate-atropine (LOMOTIL) 2.5-0.025 MG tablet Take 1 tablet by mouth 4 (four) times daily as needed for diarrhea or loose stools.   dronabinol (MARINOL) 5 MG capsule Take 5 mg by mouth daily before lunch.   escitalopram (LEXAPRO) 20 MG tablet Take 20 mg by mouth daily.   fidaxomicin (DIFICID) 200 MG TABS tablet Take 1 tablet (200 mg total) by mouth 2 (two) times daily.   hydrOXYzine (VISTARIL) 25 MG capsule Take 25 mg by mouth daily.   ketorolac (TORADOL) 10 MG tablet Take 10 mg by mouth every 6 (six) hours as needed.   levocetirizine (XYZAL) 5 MG tablet Take 5 mg by mouth every evening.   lidocaine (XYLOCAINE) 5 % ointment Apply 1 Application topically as needed.   loperamide (IMODIUM) 2 MG capsule Take 1 capsule (2 mg total) by mouth 4 (four) times daily as needed for diarrhea or loose stools.   megestrol (MEGACE) 40 MG tablet Take 2 tablets (80 mg total) by mouth 2 (two) times daily.   mirtazapine (REMERON) 30 MG tablet Take 1 tablet (30 mg total) by mouth  at bedtime.   montelukast (SINGULAIR) 10 MG tablet Take 10 mg by mouth at bedtime.   mupirocin ointment (BACTROBAN) 2 % Apply 1 Application topically daily. Buttock s/p biopsy   norethindrone-ethinyl estradiol (CYCLAFEM) 0.5/0.75/1-35 MG-MCG tablet Take 1 tablet by mouth daily.   ondansetron (ZOFRAN-ODT) 4 MG disintegrating tablet Take 1 tablet (4 mg total) by mouth every 8 (eight) hours as needed for nausea or vomiting.   oxybutynin (DITROPAN) 5 MG tablet Take 1 tablet (5 mg total) by mouth every 8 (eight) hours as needed for bladder spasms.   phenazopyridine (PYRIDIUM) 200 MG tablet Take 1 tablet (200 mg total) by mouth 3 (three) times daily as needed (for pain with urination).   promethazine (PHENERGAN) 12.5 MG tablet Take 1 tablet (12.5 mg total) by mouth every 6 (six) hours as needed for nausea or vomiting.   rifaximin (XIFAXAN) 550 MG TABS tablet Take 1 tablet (550 mg total) by mouth 3 (three) times daily for 14 days.   Sodium Sulfate-Mag Sulfate-KCl (SUTAB) [phone] MG TABS At 5 PM take 12 tablets using the 8 oz cup provided in the kit drinking 5 cups of water and 5 hours before your procedure repeat the same process.   sucralfate (CARAFATE) 1 g tablet Take 1 g by mouth 3 (three) times daily as needed.   triamcinolone ointment (KENALOG) 0.1 % Apply twice daily to affected areas as needed for rash/itching.  Avoid applying to face, groin, and axilla.   Vitamin D, Ergocalciferol, (DRISDOL) 1.25 MG (50000 UNIT) CAPS capsule Take 1 capsule (50,000 Units total) by mouth every 7 (seven) days.   pantoprazole (PROTONIX) 40 MG tablet Take 1 tablet (40 mg total) by mouth daily.   No facility-administered encounter medications on file as of 09/06/2023.    Surgical History: Past Surgical History:  Procedure Laterality Date   COLONOSCOPY WITH ESOPHAGOGASTRODUODENOSCOPY (EGD)  05/2022   COLONOSCOPY WITH PROPOFOL N/A 05/18/2023   Procedure: COLONOSCOPY WITH PROPOFOL;  Surgeon: [name], MD;   Location: [facility] ENDOSCOPY;  Service: Gastroenterology;  Laterality: N/A;   CYSTOSCOPY/URETEROSCOPY/HOLMIUM LASER/STENT PLACEMENT Right 10/21/2022   Procedure: CYSTOSCOPY RIGHT URETEROSCOPY, RETROGRADE PYELOGRAM, STONE BASKETTING, AND RIGHT URTERAL STENT PLACEMENT;  Surgeon: Rene Paci, MD;  Location: [facility];  Service: Urology;  Laterality: Right;   ESOPHAGOGASTRODUODENOSCOPY (EGD) WITH PROPOFOL N/A 05/18/2023   Procedure: ESOPHAGOGASTRODUODENOSCOPY (EGD) WITH PROPOFOL;  Surgeon: [name], MD;  Location: [facility] ENDOSCOPY;  Service: Gastroenterology;  Laterality: N/A;   KNEE ARTHROSCOPY Right    age 38   NASAL SINUS SURGERY  12/18/2016    Medical History: Past Medical History:  Diagnosis Date   Anxiety    Depression    Family history of adverse reaction to anesthesia    mother--- ponv   GERD (gastroesophageal reflux disease)    History of gastritis 05/2022   History of kidney stones    Hypothyroidism    10-20-2022  per pt currently no meds ,followed by pcp   Lower urinary tract symptoms (LUTS)    Nausea and vomiting 03/16/2023   Right ureteral calculus    Wears glasses     Family History: Family History  Problem Relation Age of Onset   Lupus Sister    Kidney disease Maternal Uncle    Kidney Stones Paternal Grandmother    Bladder Cancer Neg Hx    Kidney cancer Neg Hx    Prostate cancer Neg Hx     Social History   Socioeconomic History   Marital status: Single    Spouse name: Not on file   Number of children: Not on file   Years of education: Not on file   Highest education level: Not on file  Occupational History   Not on file  Tobacco Use   Smoking status: Never   Smokeless tobacco: Never  Vaping Use   Vaping status: Never Used  Substance and Sexual Activity   Alcohol use: Not Currently    Comment: 10-20-2022 stopped 2 wks ago   Drug use: Not Currently    Types: Marijuana    Comment: 10-20-2022 per pt last smoked marijuna 2 wks  ago   Sexual activity: Not Currently    Birth control/protection: None  Other Topics Concern   Not on file  Social History Narrative   Lives alone   Social Drivers of Health   Financial Resource Strain: Not on file  Food Insecurity: Not on file  Transportation Needs: Not on file  Physical Activity: Not on file  Stress: Not on file  Social Connections: Not on file  Intimate Partner Violence: Not on file      Review of Systems  Constitutional:  Positive for appetite change (continued decreased/lack of appetite.  smoking marijuana helps as well as taking mirtazapine.) and unexpected weight change (back up to 90 lbs, BMI 17.04). Negative for chills, fatigue and fever.  HENT: Negative.  Negative for dental problem.  Tender on neck where tonsils are, often has swollen tonsils and tonsil stones.  Eyes: Negative.   Respiratory: Negative.  Negative for cough, chest tightness, shortness of breath and wheezing.   Cardiovascular: Negative.  Negative for chest pain and palpitations.  Gastrointestinal:  Positive for abdominal distention, diarrhea and nausea. Negative for abdominal pain and rectal pain.       Increased frequency and variable consistency of stool  Endocrine:       Temp fluctuations, hair loss, weight loss, need to order labs to rule out hormone abnormalities  Genitourinary:  Negative for difficulty urinating, dysuria, enuresis, flank pain, frequency, hematuria, menstrual problem, pelvic pain, vaginal bleeding, vaginal discharge and vaginal pain.  Musculoskeletal:  Positive for myalgias.  Skin: Negative.   Allergic/Immunologic: Negative for immunocompromised state.  Neurological:  Positive for weakness (muscle weakness, body ache). Negative for headaches.  Hematological:  Bruises/bleeds easily.  Psychiatric/Behavioral:  Positive for sleep disturbance. Negative for behavioral problems, self-injury and suicidal ideas. The patient is nervous/anxious (expected due to current  medical problem).     Vital Signs: BP 110/70   Pulse 86   Temp 98.3 F (36.8 C)   Resp 16   Ht 5\' 1"  (1.549 m)   Wt 86 lb 3.2 oz (39.1 kg)   SpO2 98%   BMI 16.29 kg/m    Physical Exam Vitals reviewed.  Constitutional:      General: She is not in acute distress.    Appearance: Normal appearance. She is underweight. She is not ill-appearing.  HENT:     Head: Normocephalic and atraumatic.  Eyes:     Pupils: Pupils are equal, round, and reactive to light.  Cardiovascular:     Rate and Rhythm: Normal rate and regular rhythm.  Pulmonary:     Effort: Pulmonary effort is normal. No respiratory distress.  Neurological:     Mental Status: She is alert and oriented to person, place, and time.  Psychiatric:        Mood and Affect: Mood normal.        Behavior: Behavior normal.        Assessment/Plan: 1. Chronic diarrhea of unknown origin (Primary) Referred to hematology and endocrinology at [facility] in [place]. Xifaxan prescribed. Patient has already been treated with oral vancomycin and metronidazole.  - Ambulatory referral to Hematology / Oncology - Ambulatory referral to Endocrinology - rifaximin (XIFAXAN) 550 MG TABS tablet; Take 1 tablet (550 mg total) by mouth 3 (three) times daily for 14 days.  Dispense: 42 tablet; Refill: 0  2. Unexplained weight loss Referred to [facility] hematology and endocrinology in [place]. - Ambulatory referral to Hematology / Oncology - Ambulatory referral to Endocrinology  3. Abnormal thyroid function test Referred to [facility] in [place]  - Ambulatory referral to Endocrinology  4. Decreased appetite Referred to hematology at [facility]  - Ambulatory referral to Hematology / Oncology  5. Elevated MCV Referred to hematology at [facility]  - Ambulatory referral to Hematology / Oncology  6. Easy bruising Referred to hematology at [facility]  - Ambulatory referral to Hematology / Oncology   General Counseling: Berneta Levins understanding of the  findings of todays visit and agrees with plan of treatment. I have discussed any further diagnostic evaluation that may be needed or ordered today. We also reviewed her medications today. she has been encouraged to call the office with any questions or concerns that should arise related to todays visit.    Orders Placed This Encounter  Procedures   Ambulatory referral  to Hematology / Oncology   Ambulatory referral to Endocrinology    Meds ordered this encounter  Medications   rifaximin (XIFAXAN) 550 MG TABS tablet    Sig: Take 1 tablet (550 mg total) by mouth 3 (three) times daily for 14 days.    Dispense:  42 tablet    Refill:  0    Return for previously scheduled, CPE, Abdulhadi Stopa PCP in june and otherwise as needed. .   Total time spent:30 Minutes Time spent includes review of chart, medications, test results, and follow up plan with the patient.   [place] Controlled Substance Database was reviewed by me.  This patient was seen by Sallyanne Kuster, FNP-C in collaboration with Dr. Beverely Risen as a part of collaborative care agreement.   Romani Wilbon R. Tedd Sias, MSN, FNP-C Internal medicine

## 2023-09-07 ENCOUNTER — Telehealth: Payer: Self-pay | Admitting: Nurse Practitioner

## 2023-09-07 ENCOUNTER — Encounter: Payer: Self-pay | Admitting: Nurse Practitioner

## 2023-09-07 NOTE — Telephone Encounter (Signed)
Awaiting 09/06/23 office notes for Urgent Hematology & Endocrinology referral-[facility]

## 2023-09-08 ENCOUNTER — Telehealth: Payer: Self-pay | Admitting: Nurse Practitioner

## 2023-09-08 NOTE — Telephone Encounter (Signed)
Endocrinology referral faxed to [facility]; [phone]. Notified patient. Gave pt telephone [phone]

## 2023-09-08 NOTE — Telephone Encounter (Signed)
Hematology referral faxed to [facility]; [phone]. Notified patient. Gave pt telephone [phone]

## 2023-09-15 ENCOUNTER — Telehealth: Payer: Self-pay | Admitting: Nurse Practitioner

## 2023-09-15 NOTE — Telephone Encounter (Signed)
Uploaded 09/06/23 office notes and labs to [facility]

## 2023-10-04 ENCOUNTER — Ambulatory Visit: Payer: 59 | Admitting: Gastroenterology

## 2023-10-04 VITALS — BP 119/80 | HR 88 | Temp 97.6°F | Wt 83.8 lb

## 2023-10-04 DIAGNOSIS — R197 Diarrhea, unspecified: Secondary | ICD-10-CM

## 2023-10-04 DIAGNOSIS — R634 Abnormal weight loss: Secondary | ICD-10-CM

## 2023-10-04 DIAGNOSIS — K580 Irritable bowel syndrome with diarrhea: Secondary | ICD-10-CM

## 2023-10-04 NOTE — Addendum Note (Signed)
Addended by: Adela Ports on: 10/04/2023 04:07 PM   Modules accepted: Orders

## 2023-10-04 NOTE — Progress Notes (Signed)
[name] MD, MRCP(U.K) [address]  [address]  [place], [place] [address]  Main: [phone]  Fax: [phone]   Primary Care Physician: Sallyanne Kuster, NP  Primary Gastroenterologist:  Dr. [name]   Chief Complaint  Patient presents with   Diarrhea    HPI: Pailynn Vahey is a 38 y.o. female Summary of history :     Last seen back in July 2024 for abnormal weight loss decreased appetite diarrhea and abdominal pain.She has lost 30- lbs 40lbsweight over the past few months, decreased appetite. Profuse diarrhea multiple bowel movements a day lots of mucus no blood denies any NSAID use. No vomiting. Nausea on and off. Smokes marijuana. No cigarette smoking. No clear inability to eat certain foods. Has been told she may have Hashimoto's but is unsure. She does not recollect has had a full evaluation yet concerned about the weight loss.   03/17/2023: GI PCR stool C. difficile positive, Arna Medici virus positive treated with Dificid.  H. pylori breath test negative CRP normal alpha gal panel normal in June 2024 HIV negative   05/18/2023: EGD: Gastritis confirmed on biopsy no H. pylori normal appearance of the duodenum biopsied showed no evidence of celiac disease.  Colonoscopy was performed on the same day normal appearance of the colon.  Random colon biopsy showed no abnormality. On 05/19/2023 she weighed 83 pounds.  In July 2024 she weighed 91 pounds. In October 2024 she weighed 86 pounds   Interval history 06/03/2023-10/04/2023  08/26/2023: C. difficile testing negative.  CBC and CMP normal.  Pancreatic elastase normal fecal fat normal CRP normal ESR normal   06/18/2023: CT chest abdomen and pelvis with contrast no acute findings.  Was given a course of Xifaxan by her primary care provider in January 2025.  He says she continues to have loose stools after a bowel movement.  Has not used any marijuana for a long time denies any NSAID denies any artificial sugars or  sweeteners.  She says that the Xifaxan prescribed could not be taken as was not covered Weight at our office is around 82.5 pounds without her shoes Current Outpatient Medications  Medication Sig Dispense Refill   citalopram (CELEXA) 20 MG tablet Take 20 mg by mouth daily.     cyclobenzaprine (FLEXERIL) 10 MG tablet Take 10 mg by mouth at bedtime.     diphenoxylate-atropine (LOMOTIL) 2.5-0.025 MG tablet Take 1 tablet by mouth 4 (four) times daily as needed for diarrhea or loose stools. 30 tablet 0   dronabinol (MARINOL) 5 MG capsule Take 5 mg by mouth daily before lunch.     escitalopram (LEXAPRO) 20 MG tablet Take 20 mg by mouth daily.     fidaxomicin (DIFICID) 200 MG TABS tablet Take 1 tablet (200 mg total) by mouth 2 (two) times daily. 20 tablet 0   hydrOXYzine (VISTARIL) 25 MG capsule Take 25 mg by mouth daily.     ketorolac (TORADOL) 10 MG tablet Take 10 mg by mouth every 6 (six) hours as needed.     levocetirizine (XYZAL) 5 MG tablet Take 5 mg by mouth every evening.     lidocaine (XYLOCAINE) 5 % ointment Apply 1 Application topically as needed. 30 g 0   loperamide (IMODIUM) 2 MG capsule Take 1 capsule (2 mg total) by mouth 4 (four) times daily as needed for diarrhea or loose stools. 12 capsule 0   megestrol (MEGACE) 40 MG tablet Take 2 tablets (80 mg total) by mouth 2 (two) times daily.  120 tablet 5   mirtazapine (REMERON) 30 MG tablet Take 1 tablet (30 mg total) by mouth at bedtime. 30 tablet 5   montelukast (SINGULAIR) 10 MG tablet Take 10 mg by mouth at bedtime.     mupirocin ointment (BACTROBAN) 2 % Apply 1 Application topically daily. Buttock s/p biopsy 30 g 0   norethindrone-ethinyl estradiol (CYCLAFEM) 0.5/0.75/1-35 MG-MCG tablet Take 1 tablet by mouth daily.     ondansetron (ZOFRAN-ODT) 4 MG disintegrating tablet Take 1 tablet (4 mg total) by mouth every 8 (eight) hours as needed for nausea or vomiting. 20 tablet 0   oxybutynin (DITROPAN) 5 MG tablet Take 1 tablet (5 mg total) by  mouth every 8 (eight) hours as needed for bladder spasms. 30 tablet 1   phenazopyridine (PYRIDIUM) 200 MG tablet Take 1 tablet (200 mg total) by mouth 3 (three) times daily as needed (for pain with urination). 30 tablet 0   promethazine (PHENERGAN) 12.5 MG tablet Take 1 tablet (12.5 mg total) by mouth every 6 (six) hours as needed for nausea or vomiting. 30 tablet 0   Sodium Sulfate-Mag Sulfate-KCl (SUTAB) [phone] MG TABS At 5 PM take 12 tablets using the 8 oz cup provided in the kit drinking 5 cups of water and 5 hours before your procedure repeat the same process. 24 tablet 0   sucralfate (CARAFATE) 1 g tablet Take 1 g by mouth 3 (three) times daily as needed.     triamcinolone ointment (KENALOG) 0.1 % Apply twice daily to affected areas as needed for rash/itching. Avoid applying to face, groin, and axilla. 30 g 2   Vitamin D, Ergocalciferol, (DRISDOL) 1.25 MG (50000 UNIT) CAPS capsule Take 1 capsule (50,000 Units total) by mouth every 7 (seven) days. 15 capsule 1   pantoprazole (PROTONIX) 40 MG tablet Take 1 tablet (40 mg total) by mouth daily. 30 tablet 0   No current facility-administered medications for this visit.    Allergies as of 10/04/2023   (No Known Allergies)     ROS:  General: Negative for anorexia, weight loss, fever, chills, fatigue, weakness. ENT: Negative for hoarseness, difficulty swallowing , nasal congestion. CV: Negative for chest pain, angina, palpitations, dyspnea on exertion, peripheral edema.  Respiratory: Negative for dyspnea at rest, dyspnea on exertion, cough, sputum, wheezing.  GI: See history of present illness. GU:  Negative for dysuria, hematuria, urinary incontinence, urinary frequency, nocturnal urination.  Endo: Negative for unusual weight change.    Physical Examination:   BP 119/80   Pulse 88   Temp 97.6 F (36.4 C) (Oral)   Wt 83 lb 12.8 oz (38 kg)   BMI 15.83 kg/m   General: Appears very thin and cachectic Psych: Alert and  cooperative, normal mood and affect.   Imaging Studies: No results found.  Assessment and Plan:   Sharnelle Cappelli is a 38 y.o. y/o female  for weight loss, diarrhea.  Her weight has actually been stable for a while at around 81 to 86 pounds today she weighs around 83 pounds.  She has not lost any further weight no evidence of any inflammatory diarrhea within normal fecal calprotectin in the past, CRP, abdominal imaging, EGD colonoscopy biopsies, celiac serology.  Fecal fat elastase levels have also been normal.    Plan  1.  Check fecal calprotectin, pH, osmolality stool sodium potassium so that I can calculate the osmolar gap to determine if it is diet related diarrhea or secretory diarrhea.  2.  As needed use of Imodium.  Dr [name]  MD,MRCP [facility]) Follow up in 8 to 10 weeks

## 2023-10-05 ENCOUNTER — Encounter: Payer: Self-pay | Admitting: Gastroenterology

## 2023-10-08 ENCOUNTER — Other Ambulatory Visit: Payer: Self-pay

## 2023-10-08 DIAGNOSIS — R634 Abnormal weight loss: Secondary | ICD-10-CM

## 2023-10-08 DIAGNOSIS — R197 Diarrhea, unspecified: Secondary | ICD-10-CM

## 2023-10-08 DIAGNOSIS — K580 Irritable bowel syndrome with diarrhea: Secondary | ICD-10-CM

## 2023-10-12 ENCOUNTER — Encounter: Payer: Self-pay | Admitting: Nurse Practitioner

## 2023-10-19 ENCOUNTER — Other Ambulatory Visit: Payer: Self-pay | Admitting: Gastroenterology

## 2023-10-21 ENCOUNTER — Encounter: Payer: Self-pay | Admitting: Nurse Practitioner

## 2023-10-21 LAB — FECAL FAT, QUANTITATIVE
Fat,(Fecal Lipids)Qn: 0 g/(24.h) (ref 0.0–7.1)
Stool Weight: 261 g

## 2023-10-21 NOTE — Telephone Encounter (Signed)
 Spoke with pt as per alyssa  advised her that call GI dr first for further work note extension  due to she already saw GI dr for further treatment if not then call us back

## 2023-10-26 LAB — GI PROFILE, STOOL, PCR

## 2023-10-26 LAB — OSMOLALITY, STOOL

## 2023-10-26 LAB — SODIUM, STOOL

## 2023-10-26 LAB — POTASSIUM, STOOL

## 2023-11-15 DIAGNOSIS — K580 Irritable bowel syndrome with diarrhea: Secondary | ICD-10-CM

## 2023-11-15 DIAGNOSIS — R197 Diarrhea, unspecified: Secondary | ICD-10-CM

## 2023-12-20 ENCOUNTER — Ambulatory Visit: Payer: 59 | Admitting: Gastroenterology

## 2023-12-27 ENCOUNTER — Ambulatory Visit: Admitting: Gastroenterology

## 2024-02-08 ENCOUNTER — Other Ambulatory Visit: Payer: Self-pay | Admitting: Nurse Practitioner

## 2024-02-08 ENCOUNTER — Encounter: Payer: Self-pay | Admitting: Nurse Practitioner

## 2024-02-08 ENCOUNTER — Ambulatory Visit: Payer: 59 | Admitting: Nurse Practitioner

## 2024-02-08 VITALS — BP 120/82 | HR 85 | Temp 98.5°F | Resp 16 | Ht 61.0 in | Wt 89.2 lb

## 2024-02-08 DIAGNOSIS — G4709 Other insomnia: Secondary | ICD-10-CM

## 2024-02-08 DIAGNOSIS — Z0001 Encounter for general adult medical examination with abnormal findings: Secondary | ICD-10-CM

## 2024-02-08 DIAGNOSIS — R630 Anorexia: Secondary | ICD-10-CM | POA: Diagnosis not present

## 2024-02-08 DIAGNOSIS — R634 Abnormal weight loss: Secondary | ICD-10-CM

## 2024-02-08 DIAGNOSIS — K529 Noninfective gastroenteritis and colitis, unspecified: Secondary | ICD-10-CM

## 2024-02-08 DIAGNOSIS — E559 Vitamin D deficiency, unspecified: Secondary | ICD-10-CM | POA: Diagnosis not present

## 2024-02-08 MED ORDER — MONTELUKAST SODIUM 10 MG PO TABS: 10.0000 mg | ORAL_TABLET | Freq: Every day | ORAL | 5 refills | Status: AC

## 2024-02-08 MED ORDER — MIRTAZAPINE 30 MG PO TABS: 30.0000 mg | ORAL_TABLET | Freq: Every day | ORAL | 5 refills | Status: AC

## 2024-02-08 MED ORDER — PANTOPRAZOLE SODIUM 40 MG PO TBEC: 40.0000 mg | DELAYED_RELEASE_TABLET | Freq: Every day | ORAL | 0 refills | Status: DC

## 2024-02-08 MED ORDER — VITAMIN D (ERGOCALCIFEROL) 1.25 MG (50000 UNIT) PO CAPS: 50000.0000 [IU] | ORAL_CAPSULE | ORAL | 1 refills | Status: AC

## 2024-02-08 NOTE — Telephone Encounter (Signed)
 Pt request 90 please review

## 2024-02-08 NOTE — Progress Notes (Signed)
 [facility] [address] [place], [place] [address]  Internal MEDICINE  Office Visit Note  Patient Name: Mariah Bird  [id number]  [id number]  Date of Service: 02/08/2024  Chief Complaint  Patient presents with   Depression   Gastroesophageal Reflux   Annual Exam    HPI Mariah Bird presents for an annual well visit and physical exam.  Well-appearing 38 y.o. female with poor appetite, unexplained weight loss, chronic diarrhea. History of C. Diff infection.  Pap smear: due in 2028 Labs: getting a lot of labs done with GI and endocrinology.  New or worsening pain: random bruising and itching, no rash Other concerns: none    Current Medication: Outpatient Encounter Medications as of 02/08/2024  Medication Sig   diphenoxylate -atropine  (LOMOTIL ) 2.5-0.025 MG tablet Take 1 tablet by mouth 4 (four) times daily as needed for diarrhea or loose stools.   loperamide  (IMODIUM ) 2 MG capsule Take 1 capsule (2 mg total) by mouth 4 (four) times daily as needed for diarrhea or loose stools.   mirtazapine  (REMERON ) 30 MG tablet Take 1 tablet (30 mg total) by mouth at bedtime.   montelukast  (SINGULAIR ) 10 MG tablet Take 1 tablet (10 mg total) by mouth at bedtime.   ondansetron  (ZOFRAN -ODT) 4 MG disintegrating tablet Take 1 tablet (4 mg total) by mouth every 8 (eight) hours as needed for nausea or vomiting.   pantoprazole  (PROTONIX ) 40 MG tablet Take 1 tablet (40 mg total) by mouth daily.   Vitamin D , Ergocalciferol , (DRISDOL ) 1.25 MG (50000 UNIT) CAPS capsule Take 1 capsule (50,000 Units total) by mouth every 7 (seven) days.   [DISCONTINUED] citalopram  (CELEXA ) 20 MG tablet Take 20 mg by mouth daily.   [DISCONTINUED] cyclobenzaprine (FLEXERIL) 10 MG tablet Take 10 mg by mouth at bedtime.   [DISCONTINUED] dronabinol  (MARINOL ) 5 MG capsule Take 5 mg by mouth daily before lunch.   [DISCONTINUED] escitalopram  (LEXAPRO ) 20 MG tablet Take 20 mg by mouth daily.   [DISCONTINUED] fidaxomicin   (DIFICID ) 200 MG TABS tablet Take 1 tablet (200 mg total) by mouth 2 (two) times daily.   [DISCONTINUED] hydrOXYzine  (VISTARIL ) 25 MG capsule Take 25 mg by mouth daily.   [DISCONTINUED] ketorolac  (TORADOL ) 10 MG tablet Take 10 mg by mouth every 6 (six) hours as needed.   [DISCONTINUED] levocetirizine (XYZAL) 5 MG tablet Take 5 mg by mouth every evening.   [DISCONTINUED] lidocaine  (XYLOCAINE ) 5 % ointment Apply 1 Application topically as needed.   [DISCONTINUED] megestrol  (MEGACE ) 40 MG tablet Take 2 tablets (80 mg total) by mouth 2 (two) times daily.   [DISCONTINUED] mirtazapine  (REMERON ) 30 MG tablet Take 1 tablet (30 mg total) by mouth at bedtime.   [DISCONTINUED] montelukast  (SINGULAIR ) 10 MG tablet Take 10 mg by mouth at bedtime.   [DISCONTINUED] mupirocin  ointment (BACTROBAN ) 2 % Apply 1 Application topically daily. Buttock s/p biopsy   [DISCONTINUED] norethindrone-ethinyl estradiol  (CYCLAFEM) 0.5/0.75/1-35 MG-MCG tablet Take 1 tablet by mouth daily.   [DISCONTINUED] oxybutynin  (DITROPAN ) 5 MG tablet Take 1 tablet (5 mg total) by mouth every 8 (eight) hours as needed for bladder spasms.   [DISCONTINUED] pantoprazole  (PROTONIX ) 40 MG tablet Take 1 tablet (40 mg total) by mouth daily.   [DISCONTINUED] promethazine  (PHENERGAN ) 12.5 MG tablet Take 1 tablet (12.5 mg total) by mouth every 6 (six) hours as needed for nausea or vomiting.   [DISCONTINUED] Sodium Sulfate-Mag Sulfate-KCl (SUTAB ) [phone] MG TABS At 5 PM take 12 tablets using the 8 oz cup provided in the kit drinking 5 cups of water  and 5 hours before your procedure repeat the same process.   [DISCONTINUED] sucralfate (CARAFATE) 1 g tablet Take 1 g by mouth 3 (three) times daily as needed.   [DISCONTINUED] triamcinolone  ointment (KENALOG ) 0.1 % Apply twice daily to affected areas as needed for rash/itching. Avoid applying to face, groin, and axilla.   [DISCONTINUED] Vitamin D , Ergocalciferol , (DRISDOL ) 1.25 MG (50000 UNIT) CAPS capsule  Take 1 capsule (50,000 Units total) by mouth every 7 (seven) days.   No facility-administered encounter medications on file as of 02/08/2024.    Surgical History: Past Surgical History:  Procedure Laterality Date   COLONOSCOPY WITH ESOPHAGOGASTRODUODENOSCOPY (EGD)  05/2022   COLONOSCOPY WITH PROPOFOL  N/A 05/18/2023   Procedure: COLONOSCOPY WITH PROPOFOL ;  Surgeon: Mariah Bi, MD;  Location: [facility] ENDOSCOPY;  Service: Gastroenterology;  Laterality: N/A;   CYSTOSCOPY/URETEROSCOPY/HOLMIUM LASER/STENT PLACEMENT Right 10/21/2022   Procedure: CYSTOSCOPY RIGHT URETEROSCOPY, RETROGRADE PYELOGRAM, STONE BASKETTING, AND RIGHT URTERAL STENT PLACEMENT;  Surgeon: Mariah Lonni Righter, MD;  Location: [facility];  Service: Urology;  Laterality: Right;   ESOPHAGOGASTRODUODENOSCOPY (EGD) WITH PROPOFOL  N/A 05/18/2023   Procedure: ESOPHAGOGASTRODUODENOSCOPY (EGD) WITH PROPOFOL ;  Surgeon: Mariah Bi, MD;  Location: [facility] ENDOSCOPY;  Service: Gastroenterology;  Laterality: N/A;   KNEE ARTHROSCOPY Right    age 8   NASAL SINUS SURGERY  12/18/2016    Medical History: Past Medical History:  Diagnosis Date   Anxiety    Chronic tonsillitis 06/24/2023   Depression    Family history of adverse reaction to anesthesia    mother--- ponv   GERD (gastroesophageal reflux disease)    History of gastritis 05/2022   History of kidney stones    Hypothyroidism    10-20-2022  per pt currently no meds ,followed by pcp   Lower urinary tract symptoms (LUTS)    Nausea and vomiting 03/16/2023   Right ureteral calculus    Tonsillolith 06/24/2023   Wears glasses     Family History: Family History  Problem Relation Age of Onset   Lupus Sister    Kidney disease Maternal Uncle    Kidney Stones Paternal Grandmother    Bladder Cancer Neg Hx    Kidney cancer Neg Hx    Prostate cancer Neg Hx     Social History   Socioeconomic History   Marital status: Single    Spouse name: Not on file   Number of  children: Not on file   Years of education: Not on file   Highest education level: Not on file  Occupational History   Not on file  Tobacco Use   Smoking status: Never   Smokeless tobacco: Never  Vaping Use   Vaping status: Never Used  Substance and Sexual Activity   Alcohol use: Not Currently    Comment: 10-20-2022 stopped 2 wks ago   Drug use: Not Currently    Types: Marijuana    Comment: 10-20-2022 per pt last smoked marijuna 2 wks ago   Sexual activity: Not Currently    Birth control/protection: None  Other Topics Concern   Not on file  Social History Narrative   Lives alone   Social Drivers of Health   Financial Resource Strain: Not on file  Food Insecurity: No Food Insecurity (12/22/2023)   Received from [facility]   Hunger Vital Sign    Within the past 12 months, you worried that your food would run out before you got the money to buy more.: Never true    Within the past 12 months,  the food you bought just didn't last and you didn't have money to get more.: Never true  Transportation Needs: No Transportation Needs (12/22/2023)   [facility] from [facility] - Transportation    In the past 12 months, has lack of transportation kept you from medical appointments or from getting medications?: No    Lack of Transportation (Non-Medical): No  Physical Activity: Not on file  Stress: Not on file  Social Connections: Not on file  Intimate Partner Violence: Not on file      Review of Systems  Constitutional:  Positive for appetite change (continued decreased/lack of appetite.  smoking marijuana helps as well as taking mirtazapine .) and unexpected weight change (back up to 90 lbs, BMI 17.04). Negative for chills, fatigue and fever.  HENT: Negative.  Negative for dental problem.        Tender on neck where tonsils are, often has swollen tonsils and tonsil stones.  Eyes: Negative.   Respiratory: Negative.  Negative for cough, chest  tightness, shortness of breath and wheezing.   Cardiovascular: Negative.  Negative for chest pain and palpitations.  Gastrointestinal:  Positive for abdominal distention, diarrhea and nausea. Negative for abdominal pain and rectal pain.       Increased frequency and variable consistency of stool  Endocrine:       Temp fluctuations, hair loss, weight loss, need to order labs to rule out hormone abnormalities  Genitourinary:  Negative for difficulty urinating, dysuria, enuresis, flank pain, frequency, hematuria, menstrual problem, pelvic pain, vaginal bleeding, vaginal discharge and vaginal pain.  Musculoskeletal:  Positive for myalgias.  Skin: Negative.   Allergic/Immunologic: Negative for immunocompromised state.  Neurological:  Positive for weakness (muscle weakness, body ache). Negative for headaches.  Hematological:  Bruises/bleeds easily.  Psychiatric/Behavioral:  Positive for sleep disturbance. Negative for behavioral problems, self-injury and suicidal ideas. The patient is nervous/anxious (expected due to current medical problem).     Vital Signs: BP 120/82   Pulse 85   Temp 98.5 F (36.9 C)   Resp 16   Ht 5' 1 (1.549 m)   Wt 89 lb 3.2 oz (40.5 kg)   SpO2 98%   BMI 16.85 kg/m    Physical Exam Vitals reviewed.  Constitutional:      General: She is awake. She is not in acute distress.    Appearance: Normal appearance. She is well-groomed and underweight. She is not ill-appearing or diaphoretic.  HENT:     Head: Normocephalic and atraumatic.     Right Ear: Tympanic membrane, ear canal and external ear normal.     Left Ear: Tympanic membrane, ear canal and external ear normal.     Nose: Nose normal. No congestion or rhinorrhea.     Mouth/Throat:     Lips: Pink.     Mouth: Mucous membranes are moist.     Pharynx: Oropharynx is clear. Uvula midline. No oropharyngeal exudate or posterior oropharyngeal erythema.  Eyes:     General: Lids are normal. Vision grossly intact.  Gaze aligned appropriately. No scleral icterus.       Right eye: No discharge.        Left eye: No discharge.     Extraocular Movements: Extraocular movements intact.     Conjunctiva/sclera: Conjunctivae normal.     Pupils: Pupils are equal, round, and reactive to light.     Funduscopic exam:    Right eye: Red reflex present.        Left eye: Red reflex  present. Neck:     Thyroid : No thyromegaly.     Vascular: No JVD.     Trachea: Trachea and phonation normal. No tracheal deviation.  Cardiovascular:     Rate and Rhythm: Normal rate and regular rhythm.     Pulses: Normal pulses.     Heart sounds: Normal heart sounds, S1 normal and S2 normal. No murmur heard.    No friction rub. No gallop.  Pulmonary:     Effort: Pulmonary effort is normal. No accessory muscle usage or respiratory distress.     Breath sounds: Normal breath sounds and air entry. No stridor. No wheezing or rales.  Chest:     Chest wall: No tenderness.     Comments: Declined clinical breast exam Abdominal:     General: Bowel sounds are normal. There is no distension.     Palpations: Abdomen is soft. There is no shifting dullness, fluid wave, mass or pulsatile mass.     Tenderness: There is no abdominal tenderness. There is no guarding or rebound.  Musculoskeletal:        General: No tenderness or deformity. Normal range of motion.     Cervical back: Normal range of motion and neck supple.     Right lower leg: No edema.     Left lower leg: No edema.  Lymphadenopathy:     Cervical: No cervical adenopathy.  Skin:    General: Skin is warm and dry.     Capillary Refill: Capillary refill takes less than 2 seconds.     Coloration: Skin is not pale.     Findings: No erythema or rash.  Neurological:     Mental Status: She is alert and oriented to person, place, and time.     Cranial Nerves: No cranial nerve deficit.     Motor: No abnormal muscle tone.     Coordination: Coordination normal.     Gait: Gait normal.      Deep Tendon Reflexes: Reflexes are normal and symmetric.  Psychiatric:        Mood and Affect: Mood and affect normal.        Behavior: Behavior normal. Behavior is cooperative.        Thought Content: Thought content normal.        Judgment: Judgment normal.        Assessment/Plan: 1. Encounter for routine adult health examination with abnormal findings (Primary) Age-appropriate preventive screenings and vaccinations discussed, annual physical exam completed. Routine labs for health maintenance up to date, having a lot of labs ordered by specialists. PHM updated.  Continue medications as prescribed, refills ordered  - montelukast  (SINGULAIR ) 10 MG tablet; Take 1 tablet (10 mg total) by mouth at bedtime.  Dispense: 30 tablet; Refill: 5  2. Chronic diarrhea of unknown origin Follow up with GI  3. Decreased appetite Continue mirtazapine  as prescribed.  - mirtazapine  (REMERON ) 30 MG tablet; Take 1 tablet (30 mg total) by mouth at bedtime.  Dispense: 30 tablet; Refill: 5  4. Unexplained weight loss Continue mirtazapine  as prescribed.  - mirtazapine  (REMERON ) 30 MG tablet; Take 1 tablet (30 mg total) by mouth at bedtime.  Dispense: 30 tablet; Refill: 5  5. Vitamin D  deficiency Continue vitamin D  weekly supplement as prescribed.  - Vitamin D , Ergocalciferol , (DRISDOL ) 1.25 MG (50000 UNIT) CAPS capsule; Take 1 capsule (50,000 Units total) by mouth every 7 (seven) days.  Dispense: 15 capsule; Refill: 1  6. Other insomnia Continue mirtazapine  as prescribed.  - mirtazapine  (REMERON ) 30  MG tablet; Take 1 tablet (30 mg total) by mouth at bedtime.  Dispense: 30 tablet; Refill: 5   General Counseling: Berta verbalizes understanding of the findings of todays visit and agrees with plan of treatment. I have discussed any further diagnostic evaluation that may be needed or ordered today. We also reviewed her medications today. she has been encouraged to call the office with any questions or  concerns that should arise related to todays visit.    No orders of the defined types were placed in this encounter.   Meds ordered this encounter  Medications   Vitamin D , Ergocalciferol , (DRISDOL ) 1.25 MG (50000 UNIT) CAPS capsule    Sig: Take 1 capsule (50,000 Units total) by mouth every 7 (seven) days.    Dispense:  15 capsule    Refill:  1   mirtazapine  (REMERON ) 30 MG tablet    Sig: Take 1 tablet (30 mg total) by mouth at bedtime.    Dispense:  30 tablet    Refill:  5    Note increased dose, discontinue 15 mg tablet.   pantoprazole  (PROTONIX ) 40 MG tablet    Sig: Take 1 tablet (40 mg total) by mouth daily.    Dispense:  30 tablet    Refill:  0   montelukast  (SINGULAIR ) 10 MG tablet    Sig: Take 1 tablet (10 mg total) by mouth at bedtime.    Dispense:  30 tablet    Refill:  5    Return in about 4 months (around 06/09/2024) for F/U, Dshaun Reppucci PCP.   Total time spent:30 Minutes Time spent includes review of chart, medications, test results, and follow up plan with the patient.   [place] Controlled Substance Database was reviewed by me.  This patient was seen by Mardy Maxin, FNP-C in collaboration with Dr. Sigrid Bathe as a part of collaborative care agreement.  Khristin Keleher R. Maxin, MSN, FNP-C Internal medicine

## 2024-03-09 ENCOUNTER — Encounter: Payer: Self-pay | Admitting: Nurse Practitioner

## 2024-03-09 DIAGNOSIS — K529 Noninfective gastroenteritis and colitis, unspecified: Secondary | ICD-10-CM | POA: Insufficient documentation

## 2024-03-09 DIAGNOSIS — G4709 Other insomnia: Secondary | ICD-10-CM | POA: Insufficient documentation

## 2024-05-10 ENCOUNTER — Telehealth: Payer: Self-pay | Admitting: Nurse Practitioner

## 2024-05-10 NOTE — Telephone Encounter (Signed)
 Received medical clearance from [facility]. Gave to Alyssa-Toni

## 2024-06-05 ENCOUNTER — Telehealth: Payer: Self-pay | Admitting: Nurse Practitioner

## 2024-06-05 NOTE — Telephone Encounter (Signed)
 Signed medical clearance and office notes faxed back to [facility]; [phone]. Scanned-Toni

## 2024-06-12 ENCOUNTER — Encounter: Payer: Self-pay | Admitting: Nurse Practitioner

## 2024-06-12 ENCOUNTER — Ambulatory Visit: Admitting: Nurse Practitioner

## 2024-06-12 VITALS — BP 118/82 | HR 75 | Temp 97.6°F | Resp 16 | Ht 61.0 in | Wt 89.0 lb

## 2024-06-12 DIAGNOSIS — R630 Anorexia: Secondary | ICD-10-CM

## 2024-06-12 DIAGNOSIS — R634 Abnormal weight loss: Secondary | ICD-10-CM

## 2024-06-12 DIAGNOSIS — L509 Urticaria, unspecified: Secondary | ICD-10-CM

## 2024-06-12 DIAGNOSIS — A048 Other specified bacterial intestinal infections: Secondary | ICD-10-CM

## 2024-06-12 DIAGNOSIS — R718 Other abnormality of red blood cells: Secondary | ICD-10-CM

## 2024-06-12 DIAGNOSIS — K529 Noninfective gastroenteritis and colitis, unspecified: Secondary | ICD-10-CM | POA: Diagnosis not present

## 2024-06-12 DIAGNOSIS — R233 Spontaneous ecchymoses: Secondary | ICD-10-CM

## 2024-06-12 MED ORDER — CLARITHROMYCIN 500 MG PO TABS: 500.0000 mg | ORAL_TABLET | Freq: Two times a day (BID) | ORAL | 0 refills | Status: AC

## 2024-06-12 MED ORDER — METRONIDAZOLE 500 MG PO TABS: 500.0000 mg | ORAL_TABLET | Freq: Two times a day (BID) | ORAL | 0 refills | Status: AC

## 2024-06-12 MED ORDER — PANTOPRAZOLE SODIUM 40 MG PO TBEC: 40.0000 mg | DELAYED_RELEASE_TABLET | Freq: Two times a day (BID) | ORAL | 0 refills | Status: AC

## 2024-06-12 NOTE — Progress Notes (Signed)
 [facility] [address] [place], [place] [address]  Internal MEDICINE  Office Visit Note  Patient Name: Mariah Bird  [id number]  [id number]  Date of Service: 06/12/2024  Chief Complaint  Patient presents with   Depression   Gastroesophageal Reflux   Follow-up    HPI Preet presents for a follow-up visit for GI issues and seeing a holistic doctor as well.  Positive for H. Pylori in August, was never treated with antibiotics.  Yeast in gut per holistic provider that she is seeing  Prior c.diff infection previously treated.  No malabsorption issues found by GI but the holistic provider told the patient she is not absorbing all of the nutrients from her food.  Seeing [facility] for easy bruising and also has elevated MCV Needs new GI referral for unexplained weight loss and chronic diarrhea.     Current Medication: Outpatient Encounter Medications as of 06/12/2024  Medication Sig   clarithromycin (BIAXIN) 500 MG tablet Take 1 tablet (500 mg total) by mouth 2 (two) times daily for 14 days. Take with food   metroNIDAZOLE  (FLAGYL ) 500 MG tablet Take 1 tablet (500 mg total) by mouth 2 (two) times daily for 14 days. Take with food   pantoprazole  (PROTONIX ) 40 MG tablet Take 1 tablet (40 mg total) by mouth 2 (two) times daily for 14 days.   diphenoxylate -atropine  (LOMOTIL ) 2.5-0.025 MG tablet Take 1 tablet by mouth 4 (four) times daily as needed for diarrhea or loose stools.   loperamide  (IMODIUM ) 2 MG capsule Take 1 capsule (2 mg total) by mouth 4 (four) times daily as needed for diarrhea or loose stools.   mirtazapine  (REMERON ) 30 MG tablet Take 1 tablet (30 mg total) by mouth at bedtime.   montelukast  (SINGULAIR ) 10 MG tablet Take 1 tablet (10 mg total) by mouth at bedtime.   ondansetron  (ZOFRAN -ODT) 4 MG disintegrating tablet Take 1 tablet (4 mg total) by mouth every 8 (eight) hours as needed for nausea or vomiting.   Vitamin D , Ergocalciferol , (DRISDOL ) 1.25  MG (50000 UNIT) CAPS capsule Take 1 capsule (50,000 Units total) by mouth every 7 (seven) days.   [DISCONTINUED] pantoprazole  (PROTONIX ) 40 MG tablet TAKE 1 TABLET(40 MG) BY MOUTH DAILY   No facility-administered encounter medications on file as of 06/12/2024.    Surgical History: Past Surgical History:  Procedure Laterality Date   COLONOSCOPY WITH ESOPHAGOGASTRODUODENOSCOPY (EGD)  05/2022   COLONOSCOPY WITH PROPOFOL  N/A 05/18/2023   Procedure: COLONOSCOPY WITH PROPOFOL ;  Surgeon: Therisa Bi, MD;  Location[facility] ENDOSCOPY;  Service: Gastroenterology;  Laterality: N/A;   CYSTOSCOPY/URETEROSCOPY/HOLMIUM LASER/STENT PLACEMENT Right 10/21/2022   Procedure: CYSTOSCOPY RIGHT URETEROSCOPY, RETROGRADE PYELOGRAM, STONE BASKETTING, AND RIGHT URTERAL STENT PLACEMENT;  Surgeon: Devere Lonni Righter, MD;  Location: [facility];  Service: Urology;  Laterality: Right;   ESOPHAGOGASTRODUODENOSCOPY (EGD) WITH PROPOFOL  N/A 05/18/2023   Procedure: ESOPHAGOGASTRODUODENOSCOPY (EGD) WITH PROPOFOL ;  Surgeon: Therisa Bi, MD;  Location: [facility] ENDOSCOPY;  Service: Gastroenterology;  Laterality: N/A;   KNEE ARTHROSCOPY Right    age 38   NASAL SINUS SURGERY  12/18/2016    Medical History: Past Medical History:  Diagnosis Date   Anxiety    Chronic tonsillitis 06/24/2023   Depression    Family history of adverse reaction to anesthesia    mother--- ponv   GERD (gastroesophageal reflux disease)    History of gastritis 05/2022   History of kidney stones    Hypothyroidism    10-20-2022  per pt currently no meds ,followed by  pcp   Lower urinary tract symptoms (LUTS)    Nausea and vomiting 03/16/2023   Right ureteral calculus    Tonsillolith 06/24/2023   Wears glasses     Family History: Family History  Problem Relation Age of Onset   Lupus Sister    Kidney disease Maternal Uncle    Kidney Stones Paternal Grandmother    Bladder Cancer Neg Hx    Kidney cancer Neg Hx    Prostate cancer  Neg Hx     Social History   Socioeconomic History   Marital status: Single    Spouse name: Not on file   Number of children: Not on file   Years of education: Not on file   Highest education level: Not on file  Occupational History   Not on file  Tobacco Use   Smoking status: Never   Smokeless tobacco: Never  Vaping Use   Vaping status: Never Used  Substance and Sexual Activity   Alcohol use: Not Currently    Comment: 10-20-2022 stopped 2 wks ago   Drug use: Not Currently    Types: Marijuana    Comment: 10-20-2022 per pt last smoked marijuna 2 wks ago   Sexual activity: Not Currently    Birth control/protection: None  Other Topics Concern   Not on file  Social History Narrative   Lives alone   Social Drivers of Health   Financial Resource Strain: Not on file  Food Insecurity: No Food Insecurity (12/22/2023)   Received from [facility]   Hunger Vital Sign    Within the past 12 months, you worried that your food would run out before you got the money to buy more.: Never true    Within the past 12 months, the food you bought just didn't last and you didn't have money to get more.: Never true  Transportation Needs: No Transportation Needs (12/22/2023)   [facility] - [facility]    In the past 12 months, has lack of transportation kept you from medical appointments or from getting medications?: No    Lack of Transportation (Non-Medical): No  Physical Activity: Not on file  Stress: Not on file  Social Connections: Not on file  Intimate Partner Violence: Not on file      Review of Systems  Constitutional:  Positive for appetite change (continued decreased/lack of appetite.  smoking marijuana helps as well as taking mirtazapine .) and unexpected weight change (back up to 90 lbs, BMI 17.04). Negative for chills, fatigue and fever.  HENT: Negative.  Negative for dental problem.        Tender on neck where tonsils  are, often has swollen tonsils and tonsil stones.  Eyes: Negative.   Respiratory: Negative.  Negative for cough, chest tightness, shortness of breath and wheezing.   Cardiovascular: Negative.  Negative for chest pain and palpitations.  Gastrointestinal:  Positive for abdominal distention, diarrhea and nausea. Negative for abdominal pain and rectal pain.       Increased frequency and variable consistency of stool  Endocrine:       Temp fluctuations, hair loss, weight loss, need to order labs to rule out hormone abnormalities  Genitourinary:  Negative for difficulty urinating, dysuria, enuresis, flank pain, frequency, hematuria, menstrual problem, pelvic pain, vaginal bleeding, vaginal discharge and vaginal pain.  Musculoskeletal:  Positive for myalgias.  Skin: Negative.   Allergic/Immunologic: Negative for immunocompromised state.  Neurological:  Positive for weakness (muscle weakness, body ache). Negative for  headaches.  Hematological:  Bruises/bleeds easily.  Psychiatric/Behavioral:  Positive for sleep disturbance. Negative for behavioral problems, self-injury and suicidal ideas. The patient is nervous/anxious (expected due to current medical problem).     Vital Signs: BP 118/82   Pulse 75   Temp 97.6 F (36.4 C)   Resp 16   Ht 5' 1 (1.549 m)   Wt 89 lb (40.4 kg)   SpO2 97%   BMI 16.82 kg/m    Physical Exam Vitals reviewed.  Constitutional:      General: She is not in acute distress.    Appearance: Normal appearance. She is underweight. She is not ill-appearing.  HENT:     Head: Normocephalic and atraumatic.  Eyes:     Pupils: Pupils are equal, round, and reactive to light.  Cardiovascular:     Rate and Rhythm: Normal rate and regular rhythm.  Pulmonary:     Effort: Pulmonary effort is normal. No respiratory distress.  Neurological:     Mental Status: She is alert and oriented to person, place, and time.  Psychiatric:        Mood and Affect: Mood normal.         Behavior: Behavior normal.        Assessment/Plan: 1. Chronic diarrhea of unknown origin Referred to GI - Ambulatory referral to Gastroenterology  2. Decreased appetite Referred to GI - Ambulatory referral to Gastroenterology  3. Unexplained weight loss Referred to GI - Ambulatory referral to Gastroenterology  4. H. pylori infection (Primary) Referred to GI, treatment prescribed for H. Pylori infection  - Ambulatory referral to Gastroenterology - pantoprazole  (PROTONIX ) 40 MG tablet; Take 1 tablet (40 mg total) by mouth 2 (two) times daily for 14 days.  Dispense: 28 tablet; Refill: 0 - metroNIDAZOLE  (FLAGYL ) 500 MG tablet; Take 1 tablet (500 mg total) by mouth 2 (two) times daily for 14 days. Take with food  Dispense: 28 tablet; Refill: 0 - clarithromycin (BIAXIN) 500 MG tablet; Take 1 tablet (500 mg total) by mouth 2 (two) times daily for 14 days. Take with food  Dispense: 28 tablet; Refill: 0  5. Hives Noted when eating certain foods, needs to see GI  6. Easy bruising Followed by [facility]  7. Elevated MCV Followed by [facility]    General Counseling: Delon oakland understanding of the findings of todays visit and agrees with plan of treatment. I have discussed any further diagnostic evaluation that may be needed or ordered today. We also reviewed her medications today. she has been encouraged to call the office with any questions or concerns that should arise related to todays visit.    Orders Placed This Encounter  Procedures   Ambulatory referral to Gastroenterology    Meds ordered this encounter  Medications   pantoprazole  (PROTONIX ) 40 MG tablet    Sig: Take 1 tablet (40 mg total) by mouth 2 (two) times daily for 14 days.    Dispense:  28 tablet    Refill:  0    Fill new script today   metroNIDAZOLE  (FLAGYL ) 500 MG tablet    Sig: Take 1 tablet (500 mg total) by mouth 2 (two) times daily for 14 days. Take with food    Dispense:  28 tablet     Refill:  0    Fill new script today   clarithromycin (BIAXIN) 500 MG tablet    Sig: Take 1 tablet (500 mg total) by mouth 2 (two) times daily for 14 days. Take with food  Dispense:  28 tablet    Refill:  0    Fill new script today.    Return in about 4 months (around 10/13/2024) for F/U, Perline Awe PCP.   Total time spent:30 Minutes Time spent includes review of chart, medications, test results, and follow up plan with the patient.   [place] Controlled Substance Database was reviewed by me.  This patient was seen by Mardy Maxin, FNP-C in collaboration with Dr. Sigrid Bathe as a part of collaborative care agreement.   Theon Sobotka R. Maxin, MSN, FNP-C Internal medicine

## 2024-06-14 ENCOUNTER — Telehealth: Payer: Self-pay | Admitting: Nurse Practitioner

## 2024-06-14 NOTE — Telephone Encounter (Signed)
 Awaiting 06/12/24 office notes for GI referral-Toni

## 2024-06-21 ENCOUNTER — Encounter: Payer: Self-pay | Admitting: Nurse Practitioner

## 2024-06-23 ENCOUNTER — Telehealth: Payer: Self-pay | Admitting: Nurse Practitioner

## 2024-06-23 NOTE — Telephone Encounter (Signed)
 Gastroenterology referral faxed to [facility] in [place] per patient request; [phone]. Notified patient. Gave pt telephone [phone]

## 2024-06-27 MED ORDER — ESCITALOPRAM OXALATE 10 MG PO TABS
10.0000 mg | ORAL_TABLET | Freq: Every day | ORAL | 3 refills | Status: AC
Start: 2024-06-27 — End: ?

## 2024-06-27 NOTE — Telephone Encounter (Signed)
 Patient notified

## 2024-07-03 ENCOUNTER — Encounter: Payer: Self-pay | Admitting: Physician Assistant

## 2024-07-03 ENCOUNTER — Ambulatory Visit (INDEPENDENT_AMBULATORY_CARE_PROVIDER_SITE_OTHER): Admitting: Physician Assistant

## 2024-07-03 VITALS — BP 110/60 | HR 78 | Temp 98.0°F | Resp 16 | Ht 61.0 in | Wt 89.0 lb

## 2024-07-03 DIAGNOSIS — L509 Urticaria, unspecified: Secondary | ICD-10-CM

## 2024-07-03 MED ORDER — PREDNISONE 10 MG PO TABS: ORAL_TABLET | ORAL | 0 refills | Status: AC

## 2024-07-03 NOTE — Progress Notes (Signed)
 [facility] [address] [place], [place] [address]  Internal MEDICINE  Office Visit Note  Patient Name: Mariah Bird  [id number]  [id number]  Date of Service: 07/03/2024  Chief Complaint  Patient presents with   Acute Visit   Urticaria    Ongoing for 3 months, cannot find trigger   Itchy Eye    Itchy and swollen eyes, intermittent     HPI Pt is here for a sick visit. -Hives for the past 3 months, showers seem to make it worse but so does random foods -now eyes itching and swollen, left eye sat and rigth eye Sunday. Eyes better today. Denies red eyes themselves, but more swelling beneath/around eyes -sporadic hives all over body. Neck in particular is itchy, can go all down trunk and occasionally on legs and elbows too -no change in soaps, detergents, lotions, etc -eating gluten free due to known sensitivity -cortisone cream helps some; cold compresses for eyes -used to go to allergist years ago and was on allergy shots at one point -reports tolerating prednisone previously  Current Medication:  Outpatient Encounter Medications as of 07/03/2024  Medication Sig   diphenoxylate -atropine  (LOMOTIL ) 2.5-0.025 MG tablet Take 1 tablet by mouth 4 (four) times daily as needed for diarrhea or loose stools.   escitalopram  (LEXAPRO ) 10 MG tablet Take 1 tablet (10 mg total) by mouth daily.   loperamide  (IMODIUM ) 2 MG capsule Take 1 capsule (2 mg total) by mouth 4 (four) times daily as needed for diarrhea or loose stools.   mirtazapine  (REMERON ) 30 MG tablet Take 1 tablet (30 mg total) by mouth at bedtime.   montelukast  (SINGULAIR ) 10 MG tablet Take 1 tablet (10 mg total) by mouth at bedtime.   ondansetron  (ZOFRAN -ODT) 4 MG disintegrating tablet Take 1 tablet (4 mg total) by mouth every 8 (eight) hours as needed for nausea or vomiting.   pantoprazole  (PROTONIX ) 40 MG tablet Take 1 tablet (40 mg total) by mouth 2 (two) times daily for 14 days.   predniSONE (DELTASONE) 10  MG tablet Use per dose pack   Vitamin D , Ergocalciferol , (DRISDOL ) 1.25 MG (50000 UNIT) CAPS capsule Take 1 capsule (50,000 Units total) by mouth every 7 (seven) days.   No facility-administered encounter medications on file as of 07/03/2024.      Medical History: Past Medical History:  Diagnosis Date   Anxiety    Chronic tonsillitis 06/24/2023   Depression    Family history of adverse reaction to anesthesia    mother--- ponv   GERD (gastroesophageal reflux disease)    History of gastritis 05/2022   History of kidney stones    Hypothyroidism    10-20-2022  per pt currently no meds ,followed by pcp   Lower urinary tract symptoms (LUTS)    Nausea and vomiting 03/16/2023   Right ureteral calculus    Tonsillolith 06/24/2023   Wears glasses      Vital Signs: BP 110/60   Pulse 78   Temp 98 F (36.7 C)   Resp 16   Ht 5' 1 (1.549 m)   Wt 89 lb (40.4 kg)   SpO2 99%   BMI 16.82 kg/m    Review of Systems  Constitutional:  Negative for fatigue and fever.  HENT:  Negative for congestion, mouth sores and postnasal drip.   Respiratory:  Negative for cough.   Cardiovascular:  Negative for chest pain.  Genitourinary:  Negative for flank pain.  Skin:  Positive for rash.  Itchy hives for months  Psychiatric/Behavioral: Negative.      Physical Exam Vitals reviewed.  Constitutional:      General: She is not in acute distress.    Appearance: Normal appearance. She is underweight. She is not ill-appearing.  HENT:     Head: Normocephalic and atraumatic.  Eyes:     General:        Right eye: No discharge.        Left eye: No discharge.     Extraocular Movements: Extraocular movements intact.     Conjunctiva/sclera: Conjunctivae normal.  Cardiovascular:     Rate and Rhythm: Normal rate and regular rhythm.  Pulmonary:     Effort: Pulmonary effort is normal. No respiratory distress.  Skin:    Findings: Rash present.     Comments: Sporadic hives  Neurological:      Mental Status: She is alert and oriented to person, place, and time.  Psychiatric:        Mood and Affect: Mood normal.        Behavior: Behavior normal.       Assessment/Plan: 1. Hives of unknown origin (Primary) Will start on prednisone taper and place referral to allergist. Pt will try to identify any triggers. May use cortisone topical cream as needed - Ambulatory referral to Allergy - predniSONE (DELTASONE) 10 MG tablet; Use per dose pack  Dispense: 21 tablet; Refill: 0   General Counseling: Calli verbalizes understanding of the findings of todays visit and agrees with plan of treatment. I have discussed any further diagnostic evaluation that may be needed or ordered today. We also reviewed her medications today. she has been encouraged to call the office with any questions or concerns that should arise related to todays visit.    Counseling:    Orders Placed This Encounter  Procedures   Ambulatory referral to Allergy    Meds ordered this encounter  Medications   predniSONE (DELTASONE) 10 MG tablet    Sig: Use per dose pack    Dispense:  21 tablet    Refill:  0    Time spent:30 Minutes

## 2024-07-05 ENCOUNTER — Telehealth: Payer: Self-pay | Admitting: Physician Assistant

## 2024-07-05 NOTE — Telephone Encounter (Signed)
 Allergy referral faxed to [facility] per patient request; [phone].  Notified patient. Gave pt telephone [phone]

## 2024-08-10 ENCOUNTER — Encounter: Payer: Self-pay | Admitting: Emergency Medicine

## 2024-08-10 ENCOUNTER — Emergency Department

## 2024-08-10 ENCOUNTER — Other Ambulatory Visit: Payer: Self-pay

## 2024-08-10 ENCOUNTER — Emergency Department
Admission: EM | Admit: 2024-08-10 | Discharge: 2024-08-11 | Disposition: A | Discharge status: Home / Self Care | Attending: Emergency Medicine | Admitting: Emergency Medicine

## 2024-08-10 DIAGNOSIS — E039 Hypothyroidism, unspecified: Secondary | ICD-10-CM | POA: Insufficient documentation

## 2024-08-10 DIAGNOSIS — N132 Hydronephrosis with renal and ureteral calculous obstruction: Secondary | ICD-10-CM | POA: Diagnosis not present

## 2024-08-10 DIAGNOSIS — N200 Calculus of kidney: Secondary | ICD-10-CM

## 2024-08-10 DIAGNOSIS — R10A2 Flank pain, left side: Secondary | ICD-10-CM | POA: Diagnosis present

## 2024-08-10 LAB — URINALYSIS, ROUTINE W REFLEX MICROSCOPIC
Bilirubin Urine: NEGATIVE
Glucose, UA: NEGATIVE mg/dL
Ketones, ur: NEGATIVE mg/dL
Nitrite: NEGATIVE
Protein, ur: 30 mg/dL — AB
RBC / HPF: >50 RBC/hpf (ref 0–5)
Specific Gravity, Urine: 1.031 — ABNORMAL HIGH (ref 1.005–1.030)
pH: 5 (ref 5.0–8.0)

## 2024-08-10 LAB — BASIC METABOLIC PANEL WITH GFR
Anion gap: 12 (ref 5–15)
BUN: 18 mg/dL (ref 6–20)
CO2: 22 mmol/L (ref 22–32)
Calcium: 9.1 mg/dL (ref 8.9–10.3)
Chloride: 105 mmol/L (ref 98–111)
Creatinine, Ser: 0.74 mg/dL (ref 0.44–1.00)
GFR, Estimated: >60 mL/min
Glucose, Bld: 93 mg/dL (ref 70–99)
Potassium: 4.1 mmol/L (ref 3.5–5.1)
Sodium: 140 mmol/L (ref 135–145)

## 2024-08-10 LAB — CBC
HCT: 38.3 % (ref 36.0–46.0)
Hemoglobin: 13 g/dL (ref 12.0–15.0)
MCH: 31.9 pg (ref 26.0–34.0)
MCHC: 33.9 g/dL (ref 30.0–36.0)
MCV: 93.9 fL (ref 80.0–100.0)
Platelets: 274 K/uL (ref 150–400)
RBC: 4.08 MIL/uL (ref 3.87–5.11)
RDW: 12.5 % (ref 11.5–15.5)
WBC: 4.9 K/uL (ref 4.0–10.5)
nRBC: 0 % (ref 0.0–0.2)

## 2024-08-10 LAB — POC URINE PREG, ED: Preg Test, Ur: NEGATIVE

## 2024-08-10 MED ORDER — SODIUM CHLORIDE 0.9 % IV BOLUS (SEPSIS)
1000.0000 mL | Freq: Once | INTRAVENOUS | Status: AC
  Administered 2024-08-11: 1000 mL via INTRAVENOUS

## 2024-08-10 MED ORDER — ONDANSETRON HCL 4 MG/2ML IJ SOLN
4.0000 mg | Freq: Once | INTRAMUSCULAR | Status: AC
  Administered 2024-08-11: 4 mg via INTRAVENOUS
  Filled 2024-08-10: qty 2

## 2024-08-10 MED ORDER — SODIUM CHLORIDE 0.9 % IV SOLN
25.0000 mg | Freq: Once | INTRAVENOUS | Status: AC
  Administered 2024-08-11: 25 mg via INTRAVENOUS
  Filled 2024-08-10: qty 1

## 2024-08-10 MED ORDER — KETOROLAC TROMETHAMINE 15 MG/ML IJ SOLN
15.0000 mg | Freq: Once | INTRAMUSCULAR | Status: AC
  Administered 2024-08-10: 15 mg via INTRAVENOUS
  Filled 2024-08-10: qty 1

## 2024-08-10 MED ORDER — MORPHINE SULFATE (PF) 4 MG/ML IV SOLN
4.0000 mg | Freq: Once | INTRAVENOUS | Status: AC
  Administered 2024-08-11: 4 mg via INTRAVENOUS
  Filled 2024-08-10: qty 1

## 2024-08-10 MED ORDER — ONDANSETRON HCL 4 MG/2ML IJ SOLN
4.0000 mg | Freq: Once | INTRAMUSCULAR | Status: AC
  Administered 2024-08-10: 4 mg via INTRAVENOUS
  Filled 2024-08-10: qty 2

## 2024-08-10 NOTE — ED Triage Notes (Signed)
 Pt in with L flank pain and hematuria, onset 3 hrs ago. +n/v and hx of kidney stones

## 2024-08-10 NOTE — ED Provider Notes (Signed)
 "  [facility] Provider Note    Event Date/Time   First MD Initiated Contact with Patient 08/10/24 2330     (approximate)   History   Flank Pain   HPI  Mariah Bird is a 38 y.o. female with history of kidney stones, hypothyroidism who presents to the emergency department with complaints of gross hematuria, left flank pain, nausea and vomiting.  Feels like her previous kidney stones.  No fevers, dysuria.   History provided by patient, family.    Past Medical History:  Diagnosis Date   Anxiety    Chronic tonsillitis 06/24/2023   Depression    Family history of adverse reaction to anesthesia    mother--- ponv   GERD (gastroesophageal reflux disease)    History of gastritis 05/2022   History of kidney stones    Hypothyroidism    10-20-2022  per pt currently no meds ,followed by pcp   Lower urinary tract symptoms (LUTS)    Nausea and vomiting 03/16/2023   Right ureteral calculus    Tonsillolith 06/24/2023   Wears glasses     Past Surgical History:  Procedure Laterality Date   COLONOSCOPY WITH ESOPHAGOGASTRODUODENOSCOPY (EGD)  05/2022   COLONOSCOPY WITH PROPOFOL  N/A 05/18/2023   Procedure: COLONOSCOPY WITH PROPOFOL ;  Surgeon: Therisa Bi, MD;  Location: [facility] ENDOSCOPY;  Service: Gastroenterology;  Laterality: N/A;   CYSTOSCOPY/URETEROSCOPY/HOLMIUM LASER/STENT PLACEMENT Right 10/21/2022   Procedure: CYSTOSCOPY RIGHT URETEROSCOPY, RETROGRADE PYELOGRAM, STONE BASKETTING, AND RIGHT URTERAL STENT PLACEMENT;  Surgeon: Devere Lonni Righter, MD;  Location: [facility];  Service: Urology;  Laterality: Right;   ESOPHAGOGASTRODUODENOSCOPY (EGD) WITH PROPOFOL  N/A 05/18/2023   Procedure: ESOPHAGOGASTRODUODENOSCOPY (EGD) WITH PROPOFOL ;  Surgeon: Therisa Bi, MD;  Location: [facility] ENDOSCOPY;  Service: Gastroenterology;  Laterality: N/A;   KNEE ARTHROSCOPY Right    age 74   NASAL SINUS SURGERY  12/18/2016    MEDICATIONS:  Prior to Admission  medications  Medication Sig Start Date End Date Taking? Authorizing Provider  diphenoxylate -atropine  (LOMOTIL ) 2.5-0.025 MG tablet Take 1 tablet by mouth 4 (four) times daily as needed for diarrhea or loose stools. 11/20/21   Liana Fish, NP  escitalopram  (LEXAPRO ) 10 MG tablet Take 1 tablet (10 mg total) by mouth daily. 06/27/24   Liana Fish, NP  loperamide  (IMODIUM ) 2 MG capsule Take 1 capsule (2 mg total) by mouth 4 (four) times daily as needed for diarrhea or loose stools. 04/23/22   Redwine, Madison A, PA-C  mirtazapine  (REMERON ) 30 MG tablet Take 1 tablet (30 mg total) by mouth at bedtime. 02/08/24   Liana Fish, NP  montelukast  (SINGULAIR ) 10 MG tablet Take 1 tablet (10 mg total) by mouth at bedtime. 02/08/24   Liana Fish, NP  ondansetron  (ZOFRAN -ODT) 4 MG disintegrating tablet Take 1 tablet (4 mg total) by mouth every 8 (eight) hours as needed for nausea or vomiting. 04/23/22   Redwine, Madison A, PA-C  pantoprazole  (PROTONIX ) 40 MG tablet Take 1 tablet (40 mg total) by mouth 2 (two) times daily for 14 days. 06/12/24 07/03/24  Liana Fish, NP  predniSONE  (DELTASONE ) 10 MG tablet Use per dose pack 07/03/24   McDonough, Lauren K, PA-C  Vitamin D , Ergocalciferol , (DRISDOL ) 1.25 MG (50000 UNIT) CAPS capsule Take 1 capsule (50,000 Units total) by mouth every 7 (seven) days. 02/08/24   Liana Fish, NP    Physical Exam   Triage Vital Signs: ED Triage Vitals  Encounter Vitals Group     BP 08/10/24 1913 (!) 118/90  Girls Systolic BP Percentile --      Girls Diastolic BP Percentile --      Boys Systolic BP Percentile --      Boys Diastolic BP Percentile --      Pulse Rate 08/10/24 1913 (!) 102     Resp 08/10/24 1913 (!) 22     Temp 08/10/24 1913 97.9 F (36.6 C)     Temp Source 08/10/24 1913 Oral     SpO2 08/10/24 1913 100 %     Weight 08/10/24 1914 89 lb (40.4 kg)     Height --      Head Circumference --      Peak Flow --      Pain Score 08/10/24 1914  10     Pain Loc --      Pain Education --      Exclude from Growth Chart --     Most recent vital signs: Vitals:   08/10/24 1913 08/11/24 0017  BP: (!) 118/90 112/76  Pulse: (!) 102 86  Resp: (!) 22 18  Temp: 97.9 F (36.6 C) 98 F (36.7 C)  SpO2: 100% 100%    CONSTITUTIONAL: Alert, responds appropriately to questions. Well-appearing; well-nourished HEAD: Normocephalic, atraumatic EYES: Conjunctivae clear, pupils appear equal, sclera nonicteric ENT: normal nose; moist mucous membranes NECK: Supple, normal ROM CARD: RRR; S1 and S2 appreciated RESP: Normal chest excursion without splinting or tachypnea; breath sounds clear and equal bilaterally; no wheezes, no rhonchi, no rales, no hypoxia or respiratory distress, speaking full sentences ABD/GI: Non-distended; soft, non-tender, no rebound, no guarding, no peritoneal signs BACK: The back appears normal EXT: Normal ROM in all joints; no deformity noted, no edema SKIN: Normal color for age and race; warm; no rash on exposed skin NEURO: Moves all extremities equally, normal speech PSYCH: The patient's mood and manner are appropriate.   ED Results / Procedures / Treatments   LABS: (all labs ordered are listed, but only abnormal results are displayed) Labs Reviewed  URINALYSIS, ROUTINE W REFLEX MICROSCOPIC - Abnormal; Notable for the following components:      Result Value   Color, Urine YELLOW (*)    APPearance CLOUDY (*)    Specific Gravity, Urine 1.031 (*)    Hgb urine dipstick LARGE (*)    Protein, ur 30 (*)    Leukocytes,Ua TRACE (*)    Bacteria, UA FEW (*)    All other components within normal limits  BASIC METABOLIC PANEL WITH GFR  CBC  POC URINE PREG, ED     EKG:  EKG Interpretation Date/Time:    Ventricular Rate:    PR Interval:    QRS Duration:    QT Interval:    QTC Calculation:   R Axis:      Text Interpretation:           RADIOLOGY: My personal review and interpretation of imaging: CT scan  shows left ureteral stone.  I have personally reviewed all radiology reports.   CT Renal Stone Study Result Date: 08/10/2024 CLINICAL DATA:  Provided history: Abdominal/flank pain, stone suspected EXAM: CT ABDOMEN AND PELVIS WITHOUT CONTRAST TECHNIQUE: Multidetector CT imaging of the abdomen and pelvis was performed following the standard protocol without IV contrast. RADIATION DOSE REDUCTION: This exam was performed according to the departmental dose-optimization program which includes automated exposure control, adjustment of the mA and/or kV according to patient size and/or use of iterative reconstruction technique. COMPARISON:  CT 06/04/2023 FINDINGS: Lower chest: Right lower lobe nodule, series 4,  image 6, is unchanged from prior exam. The additional pulmonary nodules on prior are not included in the field of view. Hepatobiliary: No focal liver abnormality is seen. No gallstones, gallbladder wall thickening, or biliary dilatation. Pancreas: Grossly unremarkable, not well assessed on the current exam in the absence of contrast and paucity of intra-abdominal fat. Spleen: Normal in size without focal abnormality. Adrenals/Urinary Tract: No adrenal nodule. Punctate bilateral intrarenal calculi. Increased density of the right renal pyramids typical of nephrocalcinosis. Slight prominence of the left renal collecting system. No right hydronephrosis. No definite perinephric inflammation. There is a 2 mm stone at the left ureterovesicular junction. Decompressed urinary bladder. Stomach/Bowel: Bowel assessment is limited in the absence of contrast and paucity of intra-abdominal fat. The stomach is mildly along gated. No bowel obstruction. No evidence of bowel wall thickening or inflammation, although limited assessment. The appendix is potentially visualized and normal, regardless no appendicitis. Vascular/Lymphatic: Normal caliber abdominal aorta. Limited assessment for adenopathy on the current exam. Reproductive:  Anteverted uterus.  No adnexal mass. Other: No free air.  No ascites. Musculoskeletal: There are no acute or suspicious osseous abnormalities. IMPRESSION: 1. A 2 mm stone at the left ureterovesicular junction with slight prominence of the left renal collecting system. 2. Punctate bilateral intrarenal calculi. Increased density of the right renal pyramids typical of nephrocalcinosis. 3. Right lower lobe pulmonary nodule is unchanged from prior exam. No further imaging follow-up is needed. The additional pulmonary nodules on prior exam are not included in the field of view. Electronically Signed   By: Andrea Gasman M.D.   On: 08/10/2024 22:01     PROCEDURES:  Critical Care performed: No   CRITICAL CARE Performed by: Josette Brionna Romanek   Total critical care time: 0 minutes  Critical care time was exclusive of separately billable procedures and treating other patients.  Critical care was necessary to treat or prevent imminent or life-threatening deterioration.  Critical care was time spent personally by me on the following activities: development of treatment plan with patient and/or surrogate as well as nursing, discussions with consultants, evaluation of patient's response to treatment, examination of patient, obtaining history from patient or surrogate, ordering and performing treatments and interventions, ordering and review of laboratory studies, ordering and review of radiographic studies, pulse oximetry and re-evaluation of patient's condition.   Procedures    IMPRESSION / MDM / ASSESSMENT AND PLAN / ED COURSE  I reviewed the triage vital signs and the nursing notes.    Patient here with left flank pain and vomiting.  History of kidney stones.   DIFFERENTIAL DIAGNOSIS (includes but not limited to):   Kidney stone, UTI, pyelonephritis, diverticulitis, doubt appendicitis   Patient's presentation is most consistent with acute presentation with potential threat to life or bodily  function.   PLAN: Workup initiated from triage.  No leukocytosis, normal creatinine.  Urine shows large amount of red blood cells but no other signs of infection.  Pregnancy test negative.  CT scan reviewed and interpreted by myself and the radiologist and shows 2 mm stone at the left UVJ with mild hydronephrosis.  Given Toradol  and Zofran  from triage without much relief.  Will give morphine , Phenergan , IV fluids and reassess   MEDICATIONS GIVEN IN ED: Medications  ondansetron  (ZOFRAN ) injection 4 mg (4 mg Intravenous Given 08/10/24 1926)  ketorolac  (TORADOL ) 15 MG/ML injection 15 mg (15 mg Intravenous Given 08/10/24 1926)  morphine  (PF) 4 MG/ML injection 4 mg (4 mg Intravenous Given 08/11/24 0018)  ondansetron  (ZOFRAN ) injection 4  mg (4 mg Intravenous Given 08/11/24 0018)  promethazine  (PHENERGAN ) 25 mg in sodium chloride  0.9 % 50 mL IVPB (0 mg Intravenous Stopped 08/11/24 0138)  sodium chloride  0.9 % bolus 1,000 mL (0 mLs Intravenous Stopped 08/11/24 0046)     ED COURSE: Patient reports feeling much better.  Able to eat and drink here.  Will discharge with urology follow-up, urine strainer, pain and nausea medicine, Flomax .  Discussed return precautions.  At this time, I do not feel there is any life-threatening condition present. I reviewed all nursing notes, vitals, pertinent previous records.  All lab and urine results, EKGs, imaging ordered have been independently reviewed and interpreted by myself.  I reviewed all available radiology reports from any imaging ordered this visit.  Based on my assessment, I feel the patient is safe to be discharged home without further emergent workup and can continue workup as an outpatient as needed. Discussed all findings, treatment plan as well as usual and customary return precautions.  They verbalize understanding and are comfortable with this plan.  Outpatient follow-up has been provided as needed.  All questions have been answered.    CONSULTS:   none   OUTSIDE RECORDS REVIEWED: Reviewed last internal medicine notes.       FINAL CLINICAL IMPRESSION(S) / ED DIAGNOSES   Final diagnoses:  Kidney stone     Rx / DC Orders   ED Discharge Orders          Ordered    oxyCODONE  (ROXICODONE ) 5 MG immediate release tablet  Every 8 hours PRN        08/11/24 0115    promethazine  (PHENERGAN ) 25 MG tablet  Every 6 hours PRN        08/11/24 0115    ibuprofen  (ADVIL ) 800 MG tablet  Every 8 hours PRN        08/11/24 0115    tamsulosin  (FLOMAX ) 0.4 MG CAPS capsule  Daily        08/11/24 0115             Note:  This document was prepared using Dragon voice recognition software and may include unintentional dictation errors.   Myya Meenach, Josette SAILOR, DO 08/11/24 [phone]  "

## 2024-08-11 MED ORDER — IBUPROFEN 800 MG PO TABS: 800.0000 mg | ORAL_TABLET | Freq: Three times a day (TID) | ORAL | 0 refills | Status: AC | PRN

## 2024-08-11 MED ORDER — TAMSULOSIN HCL 0.4 MG PO CAPS: 0.4000 mg | ORAL_CAPSULE | Freq: Every day | ORAL | 0 refills | Status: AC

## 2024-08-11 MED ORDER — OXYCODONE HCL 5 MG PO TABS: 5.0000 mg | ORAL_TABLET | Freq: Three times a day (TID) | ORAL | 0 refills | Status: AC | PRN

## 2024-08-11 MED ORDER — PROMETHAZINE HCL 25 MG PO TABS: 25.0000 mg | ORAL_TABLET | Freq: Four times a day (QID) | ORAL | 0 refills | Status: AC | PRN

## 2024-08-11 NOTE — Discharge Instructions (Signed)

## 2024-10-10 ENCOUNTER — Ambulatory Visit: Admitting: Nurse Practitioner

## 2025-02-08 ENCOUNTER — Encounter: Admitting: Nurse Practitioner
